# Patient Record
Sex: Female | Born: 1950
Health system: Southern US, Community
[De-identification: ages and names within clinical notes are randomized; demographics above are authoritative.]

## PROBLEM LIST (undated history)

## (undated) DIAGNOSIS — F329 Major depressive disorder, single episode, unspecified: Secondary | ICD-10-CM

## (undated) DIAGNOSIS — F32A Depression, unspecified: Secondary | ICD-10-CM

## (undated) DIAGNOSIS — S82009A Unspecified fracture of unspecified patella, initial encounter for closed fracture: Secondary | ICD-10-CM

## (undated) DIAGNOSIS — I10 Essential (primary) hypertension: Secondary | ICD-10-CM

## (undated) DIAGNOSIS — K5790 Diverticulosis of intestine, part unspecified, without perforation or abscess without bleeding: Secondary | ICD-10-CM

## (undated) DIAGNOSIS — C801 Malignant (primary) neoplasm, unspecified: Secondary | ICD-10-CM

## (undated) HISTORY — DX: Depression, unspecified: F32.A

## (undated) HISTORY — DX: Unspecified fracture of unspecified patella, initial encounter for closed fracture: S82.009A

## (undated) HISTORY — DX: Essential (primary) hypertension: I10

## (undated) HISTORY — PX: REFRACTIVE SURGERY: SHX103

## (undated) HISTORY — PX: TUBAL LIGATION: SHX77

## (undated) HISTORY — DX: Diverticulosis of intestine, part unspecified, without perforation or abscess without bleeding: K57.90

## (undated) HISTORY — DX: Major depressive disorder, single episode, unspecified: F32.9

---

## 1998-02-26 ENCOUNTER — Other Ambulatory Visit: Admission: RE | Admit: 1998-02-26 | Discharge: 1998-02-26 | Payer: Self-pay | Admitting: Obstetrics and Gynecology

## 2005-01-29 ENCOUNTER — Ambulatory Visit: Payer: Self-pay | Admitting: Gastroenterology

## 2005-02-18 ENCOUNTER — Ambulatory Visit: Payer: Self-pay | Admitting: Gastroenterology

## 2005-03-17 ENCOUNTER — Ambulatory Visit: Payer: Self-pay | Admitting: Gastroenterology

## 2005-08-22 ENCOUNTER — Ambulatory Visit (HOSPITAL_COMMUNITY): Admission: RE | Admit: 2005-08-22 | Discharge: 2005-08-22 | Payer: Self-pay | Admitting: Neurology

## 2010-01-16 ENCOUNTER — Encounter (INDEPENDENT_AMBULATORY_CARE_PROVIDER_SITE_OTHER): Payer: Self-pay | Admitting: *Deleted

## 2010-09-30 NOTE — Letter (Signed)
Summary: Colonoscopy Letter  [facility]  [address] [place], [place] [address]   Phone: [phone]  Fax: [phone]      Jan 16, 2010 MRN: [id number]   [facility] [address] [place], [place]  [address]   Dear Ms. Councilman,   According to your medical record, it is time for you to schedule a Colonoscopy. The American Cancer Society recommends this procedure as a method to detect early colon cancer. Patients with a family history of colon cancer, or a personal history of colon polyps or inflammatory bowel disease are at increased risk.  This letter has beeen generated based on the recommendations made at the time of your procedure. If you feel that in your particular situation this may no longer apply, please contact our office.  Please call our office at [phone] to schedule this appointment or to update your records at your earliest convenience.  Thank you for cooperating with [place] to provide you with the very best care possible.   Sincerely,    Vania Rea. Jarold Motto, M.D.  [facility] [phone]

## 2010-11-28 ENCOUNTER — Telehealth: Payer: Self-pay | Admitting: Gastroenterology

## 2010-12-01 NOTE — Telephone Encounter (Signed)
Unable to reach pt. Last COLON 02/18/2005 showed diverticulosis. Recall letter sent 01/16/2010. Ok to switch to Dr Juanda Chance?

## 2010-12-01 NOTE — Telephone Encounter (Signed)
Pt with + hemocult stools from 11/28/10 per Melchor Amour at Dr [facility] office. I offered pt an appt with Willette Cluster, NP for tomorrow, but pt is out of town. Per Melchor Amour, pt has no other s&s. Wilma Flavin an appt for 12/19/10 @ 4pm with Dr Juanda Chance.

## 2010-12-01 NOTE — Telephone Encounter (Signed)
OK 

## 2010-12-01 NOTE — Telephone Encounter (Signed)
Dr Juanda Chance, will you accept the pt?   Thanks.

## 2010-12-01 NOTE — Telephone Encounter (Signed)
HARD TO BELIEVE---OK

## 2010-12-02 ENCOUNTER — Ambulatory Visit: Payer: Self-pay | Admitting: Nurse Practitioner

## 2010-12-19 ENCOUNTER — Encounter: Payer: Self-pay | Admitting: Internal Medicine

## 2010-12-19 ENCOUNTER — Ambulatory Visit (INDEPENDENT_AMBULATORY_CARE_PROVIDER_SITE_OTHER): Payer: BC Managed Care – PPO | Admitting: Internal Medicine

## 2010-12-19 VITALS — BP 120/74 | HR 76 | Ht 62.0 in | Wt 138.0 lb

## 2010-12-19 DIAGNOSIS — K625 Hemorrhage of anus and rectum: Secondary | ICD-10-CM

## 2010-12-19 DIAGNOSIS — R1031 Right lower quadrant pain: Secondary | ICD-10-CM

## 2010-12-19 DIAGNOSIS — R195 Other fecal abnormalities: Secondary | ICD-10-CM

## 2010-12-19 MED ORDER — PEG-KCL-NACL-NASULF-NA ASC-C 100 G PO SOLR: 1.0000 | Freq: Once | ORAL | Status: AC

## 2010-12-19 NOTE — Patient Instructions (Addendum)
You have been scheduled for a colonoscopy. Please follow written instructions given to you at your visit today.  Please pick up your Moviprep kit at the pharmacy within the next 2-3 days. cc Dr Vernon Prey

## 2010-12-19 NOTE — Progress Notes (Signed)
Wendy Palmer Jan 21, 1951 MRN [id number]     History of Present Illness:  This is a 60 year old white female who is here to discuss a recall colonoscopy. She had 2 prior colonoscopies in 1999 and 2006 because of her family history of colon cancer in her parent. She never had any polyps. She works full time as a Midwife and has complained of decreased level of energy. She has intermittent low-grade right lower quadrant abdominal discomfort which she relates to  her ovaries. Her bowel habits have been regular. She had abnormal Hemoccult cards at Dr [facility] office but she reports having gastroenteritis at that time of the testing.   Past Medical History  Diagnosis Date  . Diverticulosis   . Depression   . Patella fracture    Past Surgical History  Procedure Date  . Tubal ligation   . Refractive surgery     reports that she quit smoking about 35 years ago. Her smoking use included Cigarettes. She has never used smokeless tobacco. She reports that she drinks about .5 ounces of alcohol per week. She reports that she does not use illicit drugs. family history includes Alzheimer's disease in her mother; Colon cancer in her mother; and Heart failure in her father. No Known Allergies      Review of Systems:  The remainder of the 10  point ROS is negative except as outlined in H&P   Physical Exam: General appearance  Well developed, in no distress. Eyes- non icteric. HEENT nontraumatic, normocephalic. Mouth no lesions, tongue papillated, no cheilosis. Neck supple without adenopathy, thyroid not enlarged, no carotid bruits, no JVD. Lungs Clear to auscultation bilaterally. Cor normal S1 normal S2, regular rhythm , no murmur,  quiet precordium. Abdomen soft abdomen with normoactive bowel sounds. Mild tenderness on deep palpation in right lower quadrant. Straight leg raising negative. No CVA tenderness, no palpable mass. Extremities no pedal edema. Skin no lesions. Neurological  alert and oriented x 3. Psychological normal mood and affect.  Assessment and Plan:  Hemoccult positive stool in a patient with a family history of colon cancer who is due for a recall colonoscopy. We will proceed with the recall colonoscopy. I have discussed the prep, sedation and the procedure itself. The lower abdominal pain is quite vague and I'm not sure whether it is related to an irritable bowel syndrome or possibly the right ovary. If her colonoscopy is negative, I would consider a pelvic ultrasound for further evaluation.   12/19/2010 Lina Sar

## 2010-12-22 ENCOUNTER — Encounter: Payer: Self-pay | Admitting: Internal Medicine

## 2010-12-25 ENCOUNTER — Encounter: Payer: Self-pay | Admitting: Internal Medicine

## 2011-01-23 ENCOUNTER — Other Ambulatory Visit: Payer: BC Managed Care – PPO | Admitting: Internal Medicine

## 2012-04-22 ENCOUNTER — Telehealth: Payer: Self-pay | Admitting: Gastroenterology

## 2012-04-22 ENCOUNTER — Encounter: Payer: Self-pay | Admitting: Internal Medicine

## 2012-04-22 NOTE — Telephone Encounter (Signed)
Already Done 10-2010

## 2012-05-19 ENCOUNTER — Ambulatory Visit (AMBULATORY_SURGERY_CENTER): Payer: BC Managed Care – PPO | Admitting: *Deleted

## 2012-05-19 VITALS — Ht 62.5 in | Wt 136.6 lb

## 2012-05-19 DIAGNOSIS — Z1211 Encounter for screening for malignant neoplasm of colon: Secondary | ICD-10-CM

## 2012-05-19 MED ORDER — MOVIPREP 100 G PO SOLR: ORAL | Status: DC

## 2012-05-20 ENCOUNTER — Encounter: Payer: Self-pay | Admitting: Internal Medicine

## 2012-05-30 ENCOUNTER — Telehealth: Payer: Self-pay | Admitting: Internal Medicine

## 2012-05-30 NOTE — Telephone Encounter (Signed)
Spoke with patient and told her she is already overdue for her colonoscopy, she has a family history of colon cancer and she had + blood in stool a year ago. Could not recommend she delay having the procedure until she is 70. She understands and will keep the appointment.

## 2012-05-30 NOTE — Telephone Encounter (Signed)
Pt would like to speak with someone regarding possibly moving her Colonoscopy to a later date.

## 2012-06-02 ENCOUNTER — Ambulatory Visit (AMBULATORY_SURGERY_CENTER): Payer: BC Managed Care – PPO | Admitting: Internal Medicine

## 2012-06-02 ENCOUNTER — Encounter: Payer: Self-pay | Admitting: Internal Medicine

## 2012-06-02 VITALS — BP 150/70 | HR 87 | Temp 96.2°F | Resp 20 | Ht 62.5 in | Wt 136.0 lb

## 2012-06-02 DIAGNOSIS — Z1211 Encounter for screening for malignant neoplasm of colon: Secondary | ICD-10-CM

## 2012-06-02 DIAGNOSIS — Z800 Family history of malignant neoplasm of digestive organs: Secondary | ICD-10-CM

## 2012-06-02 DIAGNOSIS — R195 Other fecal abnormalities: Secondary | ICD-10-CM

## 2012-06-02 MED ORDER — SODIUM CHLORIDE 0.9 % IV SOLN: 500.0000 mL | INTRAVENOUS | Status: DC

## 2012-06-02 NOTE — Op Note (Signed)
[facility] [address] [place], [address]   COLONOSCOPY PROCEDURE REPORT  PATIENT: Wendy Palmer, Wendy Palmer  MR#: [id number] BIRTHDATE: 01-18-1951 , 61  yrs. old GENDER: Female ENDOSCOPIST: Hart Carwin, MD REFERRED BY:  Rudi Heap, M.D. PROCEDURE DATE:  06/02/2012 PROCEDURE:   Colonoscopy, screening ASA CLASS:   Class I INDICATIONS:patient's immediate family history of colon cancer ,parent with colon cancer, prior colonoscopies [id number]. MEDICATIONS: MAC sedation, administered by CRNA and Propofol (Diprivan) 150 mg IV  DESCRIPTION OF PROCEDURE:   After the risks and benefits and of the procedure were explained, informed consent was obtained.  A digital rectal exam revealed no abnormalities of the rectum.    The LB [id number]-[id number]  endoscope was introduced through the anus and advanced to the cecum, which was identified by both the appendix and ileocecal valve .  The quality of the prep was good, using MoviPrep .  The instrument was then slowly withdrawn as the colon was fully examined.     COLON FINDINGS: A normal appearing cecum, ileocecal valve, and appendiceal orifice were identified.  The ascending, hepatic flexure, transverse, splenic flexure, descending, sigmoid colon and rectum appeared unremarkable.  No polyps or cancers were seen. Retroflexed views revealed no abnormalities.     The scope was then withdrawn from the patient and the procedure completed.  COMPLICATIONS: There were no complications. ENDOSCOPIC IMPRESSION: Normal colon , nothing to account for heme positive stool test  RECOMMENDATIONS: High fiber diet   REPEAT EXAM: In 5 year(s)  for Colonoscopy.  cc:  _______________________________ eSignedHart Carwin, MD 06/02/2012 9:32 AM

## 2012-06-02 NOTE — Progress Notes (Signed)
Propofol per m smith crna, all meds titrated per crna during procedure, see scanned intra procedure report. emw

## 2012-06-02 NOTE — Patient Instructions (Addendum)

## 2012-06-02 NOTE — Progress Notes (Signed)
Patient did not experience any of the following events: a burn prior to discharge; a fall within the facility; wrong site/side/patient/procedure/implant event; or a hospital transfer or hospital admission upon discharge from the facility. (G8907) Patient did not have preoperative order for IV antibiotic SSI prophylaxis. (G8918)  

## 2012-06-03 ENCOUNTER — Telehealth: Payer: Self-pay | Admitting: *Deleted

## 2012-06-03 NOTE — Telephone Encounter (Signed)
No answer, left message to call if questions or concerns. 

## 2012-11-15 ENCOUNTER — Other Ambulatory Visit: Payer: Self-pay | Admitting: Family Medicine

## 2012-11-18 ENCOUNTER — Other Ambulatory Visit: Payer: Self-pay | Admitting: Family Medicine

## 2012-12-21 ENCOUNTER — Ambulatory Visit (INDEPENDENT_AMBULATORY_CARE_PROVIDER_SITE_OTHER): Payer: BC Managed Care – PPO

## 2012-12-21 ENCOUNTER — Encounter: Payer: Self-pay | Admitting: Family Medicine

## 2012-12-21 ENCOUNTER — Ambulatory Visit (INDEPENDENT_AMBULATORY_CARE_PROVIDER_SITE_OTHER): Payer: BC Managed Care – PPO | Admitting: Family Medicine

## 2012-12-21 VITALS — BP 127/59 | HR 69 | Temp 97.1°F | Ht 61.5 in | Wt 138.0 lb

## 2012-12-21 DIAGNOSIS — F329 Major depressive disorder, single episode, unspecified: Secondary | ICD-10-CM

## 2012-12-21 DIAGNOSIS — F411 Generalized anxiety disorder: Secondary | ICD-10-CM

## 2012-12-21 DIAGNOSIS — M25561 Pain in right knee: Secondary | ICD-10-CM

## 2012-12-21 DIAGNOSIS — R0609 Other forms of dyspnea: Secondary | ICD-10-CM

## 2012-12-21 DIAGNOSIS — R5383 Other fatigue: Secondary | ICD-10-CM

## 2012-12-21 DIAGNOSIS — F3289 Other specified depressive episodes: Secondary | ICD-10-CM

## 2012-12-21 DIAGNOSIS — I10 Essential (primary) hypertension: Secondary | ICD-10-CM | POA: Insufficient documentation

## 2012-12-21 DIAGNOSIS — M25569 Pain in unspecified knee: Secondary | ICD-10-CM

## 2012-12-21 DIAGNOSIS — E785 Hyperlipidemia, unspecified: Secondary | ICD-10-CM

## 2012-12-21 DIAGNOSIS — Z23 Encounter for immunization: Secondary | ICD-10-CM

## 2012-12-21 DIAGNOSIS — R5381 Other malaise: Secondary | ICD-10-CM

## 2012-12-21 DIAGNOSIS — L719 Rosacea, unspecified: Secondary | ICD-10-CM

## 2012-12-21 DIAGNOSIS — M51379 Other intervertebral disc degeneration, lumbosacral region without mention of lumbar back pain or lower extremity pain: Secondary | ICD-10-CM

## 2012-12-21 DIAGNOSIS — R0989 Other specified symptoms and signs involving the circulatory and respiratory systems: Secondary | ICD-10-CM

## 2012-12-21 DIAGNOSIS — Z2911 Encounter for prophylactic immunotherapy for respiratory syncytial virus (RSV): Secondary | ICD-10-CM

## 2012-12-21 DIAGNOSIS — F419 Anxiety disorder, unspecified: Secondary | ICD-10-CM

## 2012-12-21 DIAGNOSIS — M5136 Other intervertebral disc degeneration, lumbar region: Secondary | ICD-10-CM

## 2012-12-21 DIAGNOSIS — M5137 Other intervertebral disc degeneration, lumbosacral region: Secondary | ICD-10-CM

## 2012-12-21 NOTE — Progress Notes (Signed)
Subjective:    Patient ID: Wendy Palmer, female    DOB: August 28, 1951, 62 y.o.   MRN: [id number]  HPI This patient presents for recheck of multiple medical problems. No one accompanies the patient today.  Patient Active Problem List  Diagnosis  . Rosacea  . Hypertension  . Hyperlipemia    In addition, See ROS.  The allergies, current medications, past medical history, surgical history, family and social history are reviewed.  Immunizations reviewed.  Health maintenance reviewed.  The following items are outstanding: Zostavax      Review of Systems  Constitutional: Positive for fatigue.  HENT: Negative.   Eyes: Negative.   Respiratory: Positive for shortness of breath (on exertion).   Cardiovascular: Negative.   Gastrointestinal: Negative.   Genitourinary: Negative.   Musculoskeletal: Positive for back pain and arthralgias (R knee).  Allergic/Immunologic: Negative.   Neurological: Positive for numbness (bilateral hands occasionally with skin discoloration).  Psychiatric/Behavioral: Negative.        Objective:   Physical Exam BP 127/59  Pulse 69  Temp(Src) 97.1 F (36.2 C) (Oral)  Ht 5' 1.5" (1.562 m)  Wt 138 lb (62.596 kg)  BMI 25.66 kg/m2  The patient appeared well nourished and normally developed, alert and oriented to time and place. Speech, behavior and judgement appear normal. Vital signs as documented.  Head exam is unremarkable with the exception of mild rhinitis which is worse on right than on left.  No scleral icterus or pallor noted.  Neck is without jugular venous distension, thyromegally, or carotid bruits. Carotid upstrokes are brisk bilaterally. No cervical adenopathy. Lungs are clear anteriorly and posteriorly to auscultation. Normal respiratory effort. Cardiac exam reveals regular rate of 72 bpm and regular rhythm. First and second heart sounds normal. No murmurs, rubs or gallops.  Abdominal exam reveals normal bowl sou nds, no masses, no  organomegaly and no aortic enlargement. No inguinal adenopathy. Breasts are normal bilaterally with no lymph node enlargement or nodules. Extremities are nonedematous and both femoral and pedal pulses are normal. Skin without pallor or jaundice.  Warm and dry, without rash. Neurologic exam reveals normal deep tendon reflexes and normal sensation. Knee pain elicited when stiff leg raised.  Unable to elicit with any other movements.      [facility] reading (PRIMARY) by  Dr. Christell Constant:  Chest x-ray:  Chest x-ray within normal limits                                                                                                 Right knee :  Calcium buildup on the patella        Assessment & Plan:  1. Rosacea - POCT urinalysis dipstick - POCT UA - Microscopic Only -Continue treatment as prescribed by Dr.Tafeen  2. Hypertension - BASIC METABOLIC PANEL WITH GFR Continue atenolol 50 daily  3. Hyperlipemia - Hepatic function panel - NMR Lipoprofile with Lipids  4. Knee pain, right - DG Knee 1-2 Views Right; Future  5. Degenerative disc disease, lumbar  6. Depression Continue imipramine daily  7. Anxiety Continue imipramine daily  8. Dyspnea on exertion - CBC  with Differential - DG Chest 2 View; Future  9. Fatigue - CBC with Differential - Vitamin D 25 hydroxy - Thyroid Panel With TSH  10. We'll give Zostavax today to catch up on immunizations  Patient Instructions  Fall precautions discussed Continue current meds and therapuetic lifestyle changes  If knee pain continues call our office for an orthopedic referral to Dr. Lequita Halt.

## 2012-12-21 NOTE — Patient Instructions (Addendum)
Fall precautions discussed Continue current meds and therapuetic lifestyle changes  If knee pain continues call our office for an orthopedic referral to Dr. Lequita Halt.  Herpes Zoster Virus Vaccine What is this medicine? HERPES ZOSTER VIRUS VACCINE (HUR peez ZOS ter vahy ruhs vak SEEN) is a vaccine. It is used to prevent shingles in adults 64 years old and over. This vaccine is not used to treat shingles or nerve pain from shingles. This medicine may be used for other purposes; ask your health care provider or pharmacist if you have questions. What should I tell my health care provider before I take this medicine? They need to know if you have any of these conditions: -cancer like leukemia or lymphoma -immune system problems or therapy -infection with fever -tuberculosis -an unusual or allergic reaction to vaccines, neomycin, gelatin, other medicines, foods, dyes, or preservatives -pregnant or trying to get pregnant -breast-feeding How should I use this medicine? This vaccine is for injection under the skin. It is given by a health care professional. Talk to your pediatrician regarding the use of this medicine in children. This medicine is not approved for use in children. Overdosage: If you think you have taken too much of this medicine contact a poison control center or emergency room at once. NOTE: This medicine is only for you. Do not share this medicine with others. What if I miss a dose? This does not apply. What may interact with this medicine? Do not take this medicine with any of the following medications: -adalimumab -anakinra -etanercept -infliximab -medicines to treat cancer -medicines that suppress your immune system This medicine may also interact with the following medications: -immunoglobulins -steroid medicines like prednisone or cortisone This list may not describe all possible interactions. Give your health care provider a list of all the medicines, herbs,  non-prescription drugs, or dietary supplements you use. Also tell them if you smoke, drink alcohol, or use illegal drugs. Some items may interact with your medicine. What should I watch for while using this medicine? Visit your doctor for regular check ups. This vaccine, like all vaccines, may not fully protect everyone. After receiving this vaccine it may be possible to pass chickenpox infection to others. Avoid people with immune system problems, pregnant women who have not had chickenpox, and newborns of women who have not had chickenpox. Talk to your doctor for more information. What side effects may I notice from receiving this medicine? Side effects that you should report to your doctor or health care professional as soon as possible: -allergic reactions like skin rash, itching or hives, swelling of the face, lips, or tongue -breathing problems -feeling faint or lightheaded, falls -fever, flu-like symptoms -pain, tingling, numbness in the hands or feet -swelling of the ankles, feet, hands -unusually weak or tired Side effects that usually do not require medical attention (report to your doctor or health care professional if they continue or are bothersome): -aches or pains -chickenpox-like rash -diarrhea -headache -loss of appetite -nausea, vomiting -redness, pain, swelling at site where injected -runny nose This list may not describe all possible side effects. Call your doctor for medical advice about side effects. You may report side effects to FDA at 1-800-FDA-1088. Where should I keep my medicine? This drug is given in a hospital or clinic and will not be stored at home. NOTE: This sheet is a summary. It may not cover all possible information. If you have questions about this medicine, talk to your doctor, pharmacist, or health care provider.  2013,  [employer]vier/Gold Standard. (02/03/2010 5:43:50 PM)

## 2012-12-22 ENCOUNTER — Other Ambulatory Visit (INDEPENDENT_AMBULATORY_CARE_PROVIDER_SITE_OTHER): Payer: BC Managed Care – PPO

## 2012-12-22 ENCOUNTER — Telehealth: Payer: Self-pay | Admitting: *Deleted

## 2012-12-22 DIAGNOSIS — R5381 Other malaise: Secondary | ICD-10-CM

## 2012-12-22 DIAGNOSIS — M25469 Effusion, unspecified knee: Secondary | ICD-10-CM

## 2012-12-22 DIAGNOSIS — R5383 Other fatigue: Secondary | ICD-10-CM

## 2012-12-22 LAB — POCT CBC
HCT, POC: 42.2 % (ref 37.7–47.9)
MCHC: 34.2 g/dL (ref 31.8–35.4)
MPV: 8.6 fL (ref 0–99.8)
POC Granulocyte: 5.5 (ref 2–6.9)
POC LYMPH PERCENT: 26.5 %L (ref 10–50)
RDW, POC: 13 %
WBC: 7.7 10*3/uL (ref 4.6–10.2)

## 2012-12-22 LAB — THYROID PANEL WITH TSH: TSH: 1.539 u[IU]/mL (ref 0.350–4.500)

## 2012-12-22 LAB — HEPATIC FUNCTION PANEL
ALT: 15 U/L (ref 0–35)
Alkaline Phosphatase: 59 U/L (ref 39–117)
Indirect Bilirubin: 0.2 mg/dL (ref 0.0–0.9)
Total Protein: 5.9 g/dL — ABNORMAL LOW (ref 6.0–8.3)

## 2012-12-22 NOTE — Telephone Encounter (Signed)
Message copied by Bearl Mulberry on Thu Dec 22, 2012  2:38 PM ------      Message from: Ernestina Penna      Created: Wed Dec 21, 2012  9:15 PM       Small knee effusion, set up appointment for her to see Dr. Antony Odea when he is in [place] the next time ------

## 2012-12-22 NOTE — Addendum Note (Signed)
Addended by: Roselyn Reef on: 12/22/2012 08:19 AM   Modules accepted: Orders

## 2012-12-22 NOTE — Telephone Encounter (Signed)
Pt notified of results Will set up appt with Dr Berton Lan

## 2012-12-23 ENCOUNTER — Other Ambulatory Visit: Payer: Self-pay | Admitting: Family Medicine

## 2012-12-23 LAB — NMR LIPOPROFILE WITH LIPIDS
HDL Particle Number: 42.4 umol/L (ref >=30.5)
LDL Size: 20.5 nm — ABNORMAL LOW (ref >20.5)
Large HDL-P: 6.1 umol/L (ref >=4.8)
Small LDL Particle Number: 687 nmol/L — ABNORMAL HIGH (ref <=527)

## 2012-12-23 LAB — VITAMIN D 25 HYDROXY (VIT D DEFICIENCY, FRACTURES): Vit D, 25-Hydroxy: 34 ng/mL (ref 30–89)

## 2012-12-23 LAB — BASIC METABOLIC PANEL WITH GFR
CO2: 25 mEq/L (ref 19–32)
Chloride: 106 mEq/L (ref 96–112)
Sodium: 138 mEq/L (ref 135–145)

## 2012-12-23 NOTE — Telephone Encounter (Signed)
Sorry Carlon did do referral   Disregard

## 2012-12-23 NOTE — Telephone Encounter (Signed)
Referral not in my workqueue

## 2012-12-26 ENCOUNTER — Telehealth: Payer: Self-pay | Admitting: *Deleted

## 2012-12-26 LAB — POCT URINALYSIS DIPSTICK
Bilirubin, UA: NEGATIVE
Blood, UA: NEGATIVE
Nitrite, UA: NEGATIVE
pH, UA: 5

## 2012-12-26 LAB — POCT UA - MICROSCOPIC ONLY
Crystals, Ur, HPF, POC: NEGATIVE
Yeast, UA: NEGATIVE

## 2012-12-26 NOTE — Telephone Encounter (Signed)
Pt notified of results Will call back to schedule appt with clinical pharmacist

## 2012-12-26 NOTE — Telephone Encounter (Signed)
Message copied by Bearl Mulberry on Mon Dec 26, 2012  6:20 PM ------      Message from: Ernestina Penna      Created: Fri Dec 23, 2012  8:07 PM       Liver Function test within normal limits      On advanced lipid panel the LDL particle number is very elevated at 1545, triglycerides are good, small LDL is elevated--- appointment with clinical                        pharmacist to discuss diet and treatment      Thyroid within normal limit      Blood sugar and renal are good, potassium is slightly elevated, when patient has visit with clinical pharmacist repeat BMP to check potassium      Vitamin D is 34, increase D3 byt 1000 daily                   ------

## 2012-12-27 NOTE — Progress Notes (Signed)
Where his CBC?

## 2012-12-29 ENCOUNTER — Telehealth: Payer: Self-pay | Admitting: Family Medicine

## 2012-12-29 NOTE — Telephone Encounter (Signed)
Pt notified that it is possible to have redness and itching from the vaccine She stated that it is better now

## 2013-01-12 ENCOUNTER — Telehealth: Payer: Self-pay | Admitting: Family Medicine

## 2013-01-12 NOTE — Telephone Encounter (Signed)
appt made for Tuesday, June 6th at 1pm. Please call and notify patient as I am out of the office until Monday, May 19th

## 2013-02-14 ENCOUNTER — Other Ambulatory Visit: Payer: Self-pay | Admitting: Nurse Practitioner

## 2013-02-17 ENCOUNTER — Other Ambulatory Visit: Payer: Self-pay | Admitting: Family Medicine

## 2013-03-08 ENCOUNTER — Encounter: Payer: Self-pay | Admitting: Pharmacist

## 2013-03-08 ENCOUNTER — Ambulatory Visit (INDEPENDENT_AMBULATORY_CARE_PROVIDER_SITE_OTHER): Payer: BC Managed Care – PPO | Admitting: Pharmacist

## 2013-03-08 VITALS — BP 122/72 | HR 70 | Ht 61.0 in | Wt 140.0 lb

## 2013-03-08 DIAGNOSIS — E875 Hyperkalemia: Secondary | ICD-10-CM

## 2013-03-08 DIAGNOSIS — R5383 Other fatigue: Secondary | ICD-10-CM

## 2013-03-08 DIAGNOSIS — E785 Hyperlipidemia, unspecified: Secondary | ICD-10-CM

## 2013-03-08 LAB — VITAMIN B12: Vitamin B-12: 599 pg/mL (ref 211–911)

## 2013-03-08 NOTE — Progress Notes (Signed)
Lipid Clinic Consultation  Chief Complaint:   Chief Complaint  Patient presents with  . Hyperlipidemia     Exam Edema:  Negative Respirations:  nromal   Xanthomas:  negative General Appearance:  alert, oriented, no acute distress and well nourished Mood/Affect:  normal  HPI:  Has history of elevated LDL-P.  Has seen a recent slight improvement but not following a specific diet.  No current medication for hyperlipidemia.  No family history of CAD     Component Value Date/Time   TRIG 74 12/22/2012 0820  LDL-P = 1545 LDL-c = 109 Assessment: CHD/CHF Risk Equivalents:  none NCEP Risk Factors Present:  HTN and age Primary Problem(s):  LDL or LDL-P elevated  AHA risk assessment = 5.9% 10 year ASCVD risk  Current NCEP Goals: LDL Goal < 100 HDL Goal >/= 45 Tg Goal < 161 Non-HDL Goal < 130  Secondary cause of hyperlipidemia present:  none Low fat diet followed?  Yes - most of the time but admits that diet is low in fruits and vegetables Low carb diet followed?  Yes -   Exercise?  Yes - but has decreased recently - not walking as much  Recommendations: Changes in lipid medication(s):  None Diet discussed in depth - Mediterranean type diet Recommended exercise - both aerobic and strength building - pt to see about [facility]. Recheck Lipid Panel:  3 months Other labs needed:  Checking BMP today because potassium was elevated at last check   Time spent counseling patient:  30 minutes

## 2013-03-09 ENCOUNTER — Telehealth: Payer: Self-pay | Admitting: Pharmacist

## 2013-03-09 DIAGNOSIS — E785 Hyperlipidemia, unspecified: Secondary | ICD-10-CM

## 2013-03-09 LAB — BASIC METABOLIC PANEL WITH GFR
CO2: 28 mEq/L (ref 19–32)
Calcium: 9.3 mg/dL (ref 8.4–10.5)
GFR, Est African American: >89 mL/min
GFR, Est Non African American: >89 mL/min
Sodium: 136 mEq/L (ref 135–145)

## 2013-03-09 NOTE — Telephone Encounter (Signed)
All labs from yesterday WNL. Patient notified

## 2013-03-21 ENCOUNTER — Other Ambulatory Visit: Payer: Self-pay | Admitting: Family Medicine

## 2013-03-21 ENCOUNTER — Telehealth: Payer: Self-pay | Admitting: Family Medicine

## 2013-04-04 ENCOUNTER — Other Ambulatory Visit: Payer: Self-pay | Admitting: Family Medicine

## 2013-04-18 ENCOUNTER — Other Ambulatory Visit: Payer: Self-pay | Admitting: Family Medicine

## 2013-04-19 NOTE — Telephone Encounter (Signed)
Last seen 12/21/12  DWM 

## 2013-05-02 NOTE — Telephone Encounter (Signed)
RX called into [facility] on 03-21-2013. Patient aware.

## 2013-06-09 ENCOUNTER — Other Ambulatory Visit: Payer: Self-pay

## 2013-08-05 ENCOUNTER — Telehealth: Payer: Self-pay | Admitting: Nurse Practitioner

## 2013-08-07 ENCOUNTER — Telehealth: Payer: Self-pay | Admitting: Family Medicine

## 2013-08-07 DIAGNOSIS — N390 Urinary tract infection, site not specified: Secondary | ICD-10-CM

## 2013-08-07 NOTE — Telephone Encounter (Signed)
Pt aware to come leave a urine and we will check it

## 2013-08-08 ENCOUNTER — Other Ambulatory Visit: Payer: BC Managed Care – PPO | Admitting: Nurse Practitioner

## 2013-08-11 NOTE — Telephone Encounter (Signed)
appt 1/6 at 2:00 with [facility]

## 2013-08-16 ENCOUNTER — Other Ambulatory Visit: Payer: BC Managed Care – PPO | Admitting: Nurse Practitioner

## 2013-09-05 ENCOUNTER — Other Ambulatory Visit: Payer: BC Managed Care – PPO | Admitting: Nurse Practitioner

## 2013-09-26 ENCOUNTER — Other Ambulatory Visit: Payer: Self-pay | Admitting: Family Medicine

## 2013-09-27 ENCOUNTER — Telehealth: Payer: Self-pay | Admitting: Pharmacist

## 2013-09-27 NOTE — Telephone Encounter (Signed)
Patient called and discussed her concerns about Medicare Part D plans.

## 2013-09-28 ENCOUNTER — Telehealth: Payer: Self-pay | Admitting: Family Medicine

## 2013-09-28 NOTE — Telephone Encounter (Signed)
Patient last seen in office on 03-08-13 by Tammy and on 4-23 by you . Please advise.

## 2013-09-29 MED ORDER — IMIPRAMINE HCL 50 MG PO TABS: ORAL_TABLET | ORAL | Status: DC

## 2013-09-29 MED ORDER — ATENOLOL 25 MG PO TABS: 25.0000 mg | ORAL_TABLET | Freq: Every day | ORAL | Status: DC

## 2013-09-29 MED ORDER — ESTRADIOL-NORETHINDRONE ACET 1-0.5 MG PO TABS: 1.0000 | ORAL_TABLET | Freq: Every day | ORAL | Status: DC

## 2013-09-29 NOTE — Telephone Encounter (Signed)
done

## 2013-10-03 ENCOUNTER — Ambulatory Visit: Payer: BC Managed Care – PPO

## 2013-11-14 ENCOUNTER — Encounter: Payer: Self-pay | Admitting: Nurse Practitioner

## 2013-11-14 ENCOUNTER — Ambulatory Visit (INDEPENDENT_AMBULATORY_CARE_PROVIDER_SITE_OTHER): Payer: Medicare Other | Admitting: Nurse Practitioner

## 2013-11-14 VITALS — BP 122/67 | HR 83 | Temp 98.0°F | Ht 61.0 in | Wt 143.0 lb

## 2013-11-14 DIAGNOSIS — L719 Rosacea, unspecified: Secondary | ICD-10-CM

## 2013-11-14 DIAGNOSIS — Z124 Encounter for screening for malignant neoplasm of cervix: Secondary | ICD-10-CM

## 2013-11-14 DIAGNOSIS — N390 Urinary tract infection, site not specified: Secondary | ICD-10-CM

## 2013-11-14 DIAGNOSIS — Z01419 Encounter for gynecological examination (general) (routine) without abnormal findings: Secondary | ICD-10-CM

## 2013-11-14 DIAGNOSIS — Z0000 Encounter for general adult medical examination without abnormal findings: Secondary | ICD-10-CM

## 2013-11-14 DIAGNOSIS — I10 Essential (primary) hypertension: Secondary | ICD-10-CM

## 2013-11-14 DIAGNOSIS — E785 Hyperlipidemia, unspecified: Secondary | ICD-10-CM

## 2013-11-14 LAB — POCT URINALYSIS DIPSTICK
BILIRUBIN UA: NEGATIVE
GLUCOSE UA: NEGATIVE
Ketones, UA: NEGATIVE
Nitrite, UA: NEGATIVE
PH UA: 6
Protein, UA: NEGATIVE
Spec Grav, UA: 1.01
Urobilinogen, UA: NEGATIVE

## 2013-11-14 LAB — POCT CBC
Granulocyte percent: 58.3 %G (ref 37–80)
HCT, POC: 38.5 % (ref 37.7–47.9)
Hemoglobin: 12.4 g/dL (ref 12.2–16.2)
Lymph, poc: 3 (ref 0.6–3.4)
MCH, POC: 28.3 pg (ref 27–31.2)
MCHC: 32.1 g/dL (ref 31.8–35.4)
MCV: 88.2 fL (ref 80–97)
MPV: 8.7 fL (ref 0–99.8)
PLATELET COUNT, POC: 218 10*3/uL (ref 142–424)
POC GRANULOCYTE: 4.4 (ref 2–6.9)
POC LYMPH %: 40.4 % (ref 10–50)
RBC: 4.4 M/uL (ref 4.04–5.48)
RDW, POC: 15.1 %
WBC: 7.5 10*3/uL (ref 4.6–10.2)

## 2013-11-14 LAB — POCT UA - MICROSCOPIC ONLY
Casts, Ur, LPF, POC: NEGATIVE
Crystals, Ur, HPF, POC: NEGATIVE
Mucus, UA: NEGATIVE
Yeast, UA: NEGATIVE

## 2013-11-14 MED ORDER — ESTRADIOL-NORETHINDRONE ACET 1-0.5 MG PO TABS: 1.0000 | ORAL_TABLET | Freq: Every day | ORAL | Status: DC

## 2013-11-14 MED ORDER — FLUCONAZOLE 150 MG PO TABS: 150.0000 mg | ORAL_TABLET | Freq: Once | ORAL | Status: DC

## 2013-11-14 MED ORDER — ATENOLOL 25 MG PO TABS: 25.0000 mg | ORAL_TABLET | Freq: Every day | ORAL | Status: DC

## 2013-11-14 MED ORDER — CIPROFLOXACIN HCL 500 MG PO TABS: 500.0000 mg | ORAL_TABLET | Freq: Two times a day (BID) | ORAL | Status: DC

## 2013-11-14 MED ORDER — IMIPRAMINE HCL 50 MG PO TABS: ORAL_TABLET | ORAL | Status: DC

## 2013-11-14 NOTE — Progress Notes (Signed)
Subjective:    Patient ID: Wendy Palmer, female    DOB: 03-30-1951, 63 y.o.   MRN: [id number]  HPI  Patient is regular patient of Dr. Laurance Flatten that is here today for annual physical exam and pap and breast only- SHe was last seen by him  Almost a year ago. Sh esays that she is doing well- No complaints today. Patient Active Problem List   Diagnosis Date Noted  . Rosacea 12/21/2012  . Hypertension 12/21/2012  . Hyperlipemia 12/21/2012   Outpatient Encounter Prescriptions as of 11/14/2013  Medication Sig  . aspirin 81 MG tablet Take 81 mg by mouth daily.    Marland Kitchen atenolol (TENORMIN) 25 MG tablet Take 1 tablet (25 mg total) by mouth daily.  . Biotin 1 MG CAPS Take 1 tablet by mouth daily.  . Cholecalciferol (VITAMIN D) 1000 UNITS capsule Take 1,000 Units by mouth daily.  . Cyanocobalamin (B-12 PO) Take by mouth daily.  Marland Kitchen estradiol-norethindrone (MIMVEY) 1-0.5 MG per tablet Take 1 tablet by mouth daily.  Marland Kitchen FINACEA 15 % cream Apply 1 application topically 2 (two) times daily.  Marland Kitchen imipramine (TOFRANIL) 50 MG tablet TAKE 2 TABLETS BY MOUTH AT BEDTIME  . Multiple Vitamin (MULTIVITAMIN) tablet Take 1 tablet by mouth daily.  . naproxen sodium (ANAPROX) 220 MG tablet Take 220 mg by mouth daily.  . Omega-3 Fatty Acids (FISH OIL PO) Take 1 capsule by mouth daily.    . minocycline (MINOCIN,DYNACIN) 100 MG capsule Take 1 capsule by mouth daily.       Review of Systems  HENT: Negative.   Eyes: Negative.   Respiratory: Negative.   Cardiovascular: Negative.   Gastrointestinal: Negative.   Genitourinary: Negative.   Musculoskeletal: Negative.   Neurological: Negative.   Psychiatric/Behavioral: Negative.   All other systems reviewed and are negative.       Objective:   Physical Exam  Constitutional: She is oriented to person, place, and time. She appears well-developed and well-nourished.  HENT:  Head: Normocephalic.  Right Ear: Hearing, tympanic membrane, external ear and ear canal normal.    Left Ear: Hearing, tympanic membrane, external ear and ear canal normal.  Nose: Nose normal.  Mouth/Throat: Uvula is midline and oropharynx is clear and moist.  Eyes: Conjunctivae and EOM are normal. Pupils are equal, round, and reactive to light.  Neck: Normal range of motion and full passive range of motion without pain. Neck supple. No JVD present. Carotid bruit is not present. No mass and no thyromegaly present.  Cardiovascular: Normal rate, normal heart sounds and intact distal pulses.   No murmur heard. Pulmonary/Chest: Effort normal and breath sounds normal. Right breast exhibits no inverted nipple, no mass, no nipple discharge, no skin change and no tenderness. Left breast exhibits no inverted nipple, no mass, no nipple discharge, no skin change and no tenderness.  Abdominal: Soft. Bowel sounds are normal. She exhibits no mass. There is no tenderness.  Genitourinary: Vagina normal and uterus normal. No breast swelling, tenderness, discharge or bleeding.  bimanual exam-No adnexal masses or tenderness. Cervix stenosised- no discharge  Musculoskeletal: Normal range of motion.  Lymphadenopathy:    She has no cervical adenopathy.  Neurological: She is alert and oriented to person, place, and time.  Skin: Skin is warm and dry.  Psychiatric: She has a normal mood and affect. Her behavior is normal. Judgment and thought content normal.   BP 122/67  Pulse 83  Temp(Src) 98 F (36.7 C) (Oral)  Ht 5' 1"  (1.549 m)  Wt 143 lb (64.864 kg)  BMI 27.03 kg/m2  Results for orders placed in visit on 11/14/13  POCT UA - MICROSCOPIC ONLY      Result Value Ref Range   WBC, Ur, HPF, POC 5-10     RBC, urine, microscopic 1-3     Bacteria, U Microscopic few     Mucus, UA negative     Epithelial cells, urine per micros few     Crystals, Ur, HPF, POC negative     Casts, Ur, LPF, POC negative     Yeast, UA negative    POCT URINALYSIS DIPSTICK      Result Value Ref Range   Color, UA gold      Clarity, UA clear     Glucose, UA negative     Bilirubin, UA negative     Ketones, UA negative     Spec Grav, UA 1.010     Blood, UA trace     pH, UA 6.0     Protein, UA negative     Urobilinogen, UA negative     Nitrite, UA negative     Leukocytes, UA small (1+)           Assessment & Plan:   1. Hypertension   2. Hyperlipemia   3. Rosacea   4. Annual physical exam   5. Encounter for routine gynecological examination    Orders Placed This Encounter  Procedures  . CMP14+EGFR  . NMR, lipoprofile  . Thyroid Panel With TSH  . POCT CBC   Meds ordered this encounter  Medications  . imipramine (TOFRANIL) 50 MG tablet    Sig: TAKE 2 TABLETS BY MOUTH AT BEDTIME    Dispense:  60 tablet    Refill:  11    Order Specific Question:  Supervising Provider    Answer:  Chipper Herb [[id number]]  . atenolol (TENORMIN) 25 MG tablet    Sig: Take 1 tablet (25 mg total) by mouth daily.    Dispense:  30 tablet    Refill:  11    Order Specific Question:  Supervising Provider    Answer:  Chipper Herb [[id number]]  . estradiol-norethindrone (MIMVEY) 1-0.5 MG per tablet    Sig: Take 1 tablet by mouth daily.    Dispense:  28 tablet    Refill:  11    Order Specific Question:  Supervising Provider    Answer:  Chipper Herb [facility] pending Health maintenance reviewed Diet and exercise encouraged Continue all meds Follow up  In 6 month   Victoria, FNP

## 2013-11-14 NOTE — Patient Instructions (Signed)

## 2013-11-16 ENCOUNTER — Telehealth: Payer: Self-pay | Admitting: *Deleted

## 2013-11-16 LAB — CMP14+EGFR
A/G RATIO: 1.9 (ref 1.1–2.5)
ALK PHOS: 65 IU/L (ref 39–117)
ALT: 14 IU/L (ref 0–32)
AST: 19 IU/L (ref 0–40)
Albumin: 3.9 g/dL (ref 3.6–4.8)
BUN / CREAT RATIO: 21 (ref 11–26)
BUN: 15 mg/dL (ref 8–27)
CALCIUM: 9.2 mg/dL (ref 8.7–10.3)
CO2: 23 mmol/L (ref 18–29)
Chloride: 103 mmol/L (ref 97–108)
Creatinine, Ser: 0.7 mg/dL (ref 0.57–1.00)
GFR calc Af Amer: 106 mL/min/{1.73_m2} (ref >59)
GFR, EST NON AFRICAN AMERICAN: 92 mL/min/{1.73_m2} (ref >59)
Globulin, Total: 2.1 g/dL (ref 1.5–4.5)
Glucose: 102 mg/dL — ABNORMAL HIGH (ref 65–99)
POTASSIUM: 4.1 mmol/L (ref 3.5–5.2)
SODIUM: 139 mmol/L (ref 134–144)
Total Bilirubin: <0.2 mg/dL (ref 0.0–1.2)
Total Protein: 6 g/dL (ref 6.0–8.5)

## 2013-11-16 LAB — NMR, LIPOPROFILE
Cholesterol: 184 mg/dL (ref <200)
HDL Cholesterol by NMR: 63 mg/dL (ref >=40)
HDL Particle Number: 43.3 umol/L (ref >=30.5)
LDL Particle Number: 1343 nmol/L — ABNORMAL HIGH (ref <1000)
LDL Size: 20.7 nm (ref >20.5)
LDLC SERPL CALC-MCNC: 95 mg/dL (ref <100)
LP-IR SCORE: 39 (ref <=45)
Small LDL Particle Number: 528 nmol/L — ABNORMAL HIGH (ref <=527)
Triglycerides by NMR: 130 mg/dL (ref <150)

## 2013-11-16 LAB — PAP IG (IMAGE GUIDED): PAP SMEAR COMMENT: 0

## 2013-11-16 LAB — THYROID PANEL WITH TSH
Free Thyroxine Index: 1.8 (ref 1.2–4.9)
T3 Uptake Ratio: 26 % (ref 24–39)
T4, Total: 6.8 ug/dL (ref 4.5–12.0)
TSH: 1.77 u[IU]/mL (ref 0.450–4.500)

## 2013-11-16 NOTE — Telephone Encounter (Signed)
Left message to return call about lab results.

## 2013-11-16 NOTE — Telephone Encounter (Signed)
Message copied by Ilean China on Thu Nov 16, 2013 10:12 AM ------      Message from: Chevis Pretty      Created: Thu Nov 16, 2013  8:14 AM       Cbc normal      Kidney and liver function stable      ldl particle numbers are improving      Urine clear      Thyroid normal      Continue current meds- low fat diet and exercise and recheck in 3 months      Pap results are not back yet ------

## 2013-11-21 ENCOUNTER — Encounter: Payer: BC Managed Care – PPO | Admitting: Pharmacist Clinician (PhC)/ Clinical Pharmacy Specialist

## 2013-12-15 ENCOUNTER — Telehealth: Payer: Self-pay | Admitting: Nurse Practitioner

## 2013-12-15 NOTE — Telephone Encounter (Signed)
ntbs- a pap will not cause pelvic pain

## 2013-12-15 NOTE — Telephone Encounter (Signed)
Pelvic pain began soon after having pap smear. The pain is intermittent and hasn't changed since onset. Denies odor or discharge.

## 2013-12-18 NOTE — Telephone Encounter (Signed)
Appointment made at 2

## 2013-12-18 NOTE — Telephone Encounter (Signed)
Spoke with patient and she is aware you want to see her, but she said it is a UTI and would like for you to just call in an antibiotic if you would

## 2013-12-18 NOTE — Telephone Encounter (Signed)
NTBS  To look at urine

## 2013-12-21 ENCOUNTER — Encounter: Payer: Self-pay | Admitting: General Practice

## 2013-12-21 ENCOUNTER — Ambulatory Visit (INDEPENDENT_AMBULATORY_CARE_PROVIDER_SITE_OTHER): Payer: Medicare Other | Admitting: General Practice

## 2013-12-21 VITALS — BP 123/54 | HR 76 | Temp 96.9°F | Ht 61.0 in | Wt 142.6 lb

## 2013-12-21 DIAGNOSIS — R300 Dysuria: Secondary | ICD-10-CM

## 2013-12-21 DIAGNOSIS — N390 Urinary tract infection, site not specified: Secondary | ICD-10-CM

## 2013-12-21 LAB — POCT UA - MICROSCOPIC ONLY
Bacteria, U Microscopic: NEGATIVE
CASTS, UR, LPF, POC: NEGATIVE
Crystals, Ur, HPF, POC: NEGATIVE
Mucus, UA: NEGATIVE
RBC, URINE, MICROSCOPIC: NEGATIVE
WBC, Ur, HPF, POC: NEGATIVE
YEAST UA: NEGATIVE

## 2013-12-21 LAB — POCT URINALYSIS DIPSTICK
Bilirubin, UA: NEGATIVE
Blood, UA: NEGATIVE
GLUCOSE UA: NEGATIVE
Ketones, UA: NEGATIVE
Leukocytes, UA: NEGATIVE
Nitrite, UA: NEGATIVE
Protein, UA: NEGATIVE
Spec Grav, UA: 1.02
UROBILINOGEN UA: NEGATIVE
pH, UA: 6

## 2013-12-21 MED ORDER — CIPROFLOXACIN HCL 500 MG PO TABS: 500.0000 mg | ORAL_TABLET | Freq: Two times a day (BID) | ORAL | Status: DC

## 2013-12-21 NOTE — Progress Notes (Signed)
   Subjective:    Patient ID: Wendy Palmer, female    DOB: [birth year], 63 y.o.   MRN: [id number]  Urinary Frequency  This is a new problem. The current episode started in the past 7 days. The problem occurs intermittently. The problem has been gradually worsening. The quality of the pain is described as burning. The pain is at a severity of 3/10. There has been no fever. She is sexually active. There is no history of pyelonephritis. Associated symptoms include frequency and urgency. Pertinent negatives include no chills, hematuria, nausea or vomiting. She has tried nothing for the symptoms. There is no history of recurrent UTIs or a urological procedure.      Review of Systems  Constitutional: Negative for chills.  Respiratory: Negative for chest tightness and shortness of breath.   Cardiovascular: Negative for chest pain and palpitations.  Gastrointestinal: Positive for abdominal pain. Negative for nausea, vomiting and blood in stool.  Genitourinary: Positive for dysuria, urgency and frequency. Negative for hematuria.  Neurological: Negative for dizziness, weakness and headaches.       Objective:   Physical Exam  Constitutional: She is oriented to person, place, and time. She appears well-developed and well-nourished.  Cardiovascular: Normal rate, regular rhythm and normal heart sounds.   Pulmonary/Chest: Effort normal and breath sounds normal.  Abdominal: Soft. Bowel sounds are normal. She exhibits no distension. There is tenderness in the suprapubic area. There is no CVA tenderness.  Neurological: She is alert and oriented to person, place, and time.  Skin: Skin is warm and dry.  Psychiatric: She has a normal mood and affect.     Results for orders placed in visit on 12/21/13  POCT URINALYSIS DIPSTICK      Result Value Ref Range   Color, UA yellow     Clarity, UA clear     Glucose, UA neg     Bilirubin, UA neg     Ketones, UA neg     Spec Grav, UA 1.020     Blood, UA neg      pH, UA 6.0     Protein, UA neg     Urobilinogen, UA negative     Nitrite, UA neg     Leukocytes, UA Negative    POCT UA - MICROSCOPIC ONLY      Result Value Ref Range   WBC, Ur, HPF, POC neg     RBC, urine, microscopic neg     Bacteria, U Microscopic neg     Mucus, UA neg     Epithelial cells, urine per micros occ     Crystals, Ur, HPF, POC neg     Casts, Ur, LPF, POC neg     Yeast, UA neg          Assessment & Plan:  1. Dysuria - POCT urinalysis dipstick - POCT UA - Microscopic Only  2. UTI (urinary tract infection)  - ciprofloxacin (CIPRO) 500 MG tablet; Take 1 tablet (500 mg total) by mouth 2 (two) times daily.  Dispense: 14 tablet; Refill: 0 -Increase fluid intake Frequent voiding Proper perineal hygiene RTO prn Patient verbalized understanding Erby Pian, FNP-C

## 2013-12-21 NOTE — Patient Instructions (Signed)
Urinary Tract Infection  Urinary tract infections (UTIs) can develop anywhere along your urinary tract. Your urinary tract is your body's drainage system for removing wastes and extra water. Your urinary tract includes two kidneys, two ureters, a bladder, and a urethra. Your kidneys are a pair of bean-shaped organs. Each kidney is about the size of your fist. They are located below your ribs, one on each side of your spine.  CAUSES  Infections are caused by microbes, which are microscopic organisms, including fungi, viruses, and bacteria. These organisms are so small that they can only be seen through a microscope. Bacteria are the microbes that most commonly cause UTIs.  SYMPTOMS   Symptoms of UTIs may vary by age and gender of the patient and by the location of the infection. Symptoms in young women typically include a frequent and intense urge to urinate and a painful, burning feeling in the bladder or urethra during urination. Older women and men are more likely to be tired, shaky, and weak and have muscle aches and abdominal pain. A fever may mean the infection is in your kidneys. Other symptoms of a kidney infection include pain in your back or sides below the ribs, nausea, and vomiting.  DIAGNOSIS  To diagnose a UTI, your caregiver will ask you about your symptoms. Your caregiver also will ask to provide a urine sample. The urine sample will be tested for bacteria and white blood cells. White blood cells are made by your body to help fight infection.  TREATMENT   Typically, UTIs can be treated with medication. Because most UTIs are caused by a bacterial infection, they usually can be treated with the use of antibiotics. The choice of antibiotic and length of treatment depend on your symptoms and the type of bacteria causing your infection.  HOME CARE INSTRUCTIONS   If you were prescribed antibiotics, take them exactly as your caregiver instructs you. Finish the medication even if you feel better after you  have only taken some of the medication.   Drink enough water and fluids to keep your urine clear or pale yellow.   Avoid caffeine, tea, and carbonated beverages. They tend to irritate your bladder.   Empty your bladder often. Avoid holding urine for long periods of time.   Empty your bladder before and after sexual intercourse.   After a bowel movement, women should cleanse from front to back. Use each tissue only once.  SEEK MEDICAL CARE IF:    You have back pain.   You develop a fever.   Your symptoms do not begin to resolve within 3 days.  SEEK IMMEDIATE MEDICAL CARE IF:    You have severe back pain or lower abdominal pain.   You develop chills.   You have nausea or vomiting.   You have continued burning or discomfort with urination.  MAKE SURE YOU:    Understand these instructions.   Will watch your condition.   Will get help right away if you are not doing well or get worse.  Document Released: 05/27/2005 Document Revised: 02/16/2012 Document Reviewed: 09/25/2011  [employer] Patient Information 2014 [employer].

## 2014-02-06 ENCOUNTER — Ambulatory Visit (INDEPENDENT_AMBULATORY_CARE_PROVIDER_SITE_OTHER): Payer: Medicare Other | Admitting: Pharmacist Clinician (PhC)/ Clinical Pharmacy Specialist

## 2014-02-06 ENCOUNTER — Encounter: Payer: Self-pay | Admitting: Pharmacist Clinician (PhC)/ Clinical Pharmacy Specialist

## 2014-02-06 DIAGNOSIS — I10 Essential (primary) hypertension: Secondary | ICD-10-CM

## 2014-02-06 NOTE — Progress Notes (Signed)
Saman Giddens comes in today with concerns about feeling tired and decreased exercise tolerance.  She also complains of frequent hot spells (not flashes per patient).  Her TSH and BG are normal with no signs of endocrine problems on her labs.  Patient's BP is 120/77 and tends to run around that number per patient, she has a home BPcuff.  Since patient is taking a low dose beta blocker for hypertension and not tachycardia, MI, etc I will attempt to wean her off atenolol 25mg  and see if her BP stays within normal limits she will not need an additional therapy.  If it increases substantially then amlodipine 2.5mg  will be started (she has tried ACEI and had cough that was disruptive).  Patient is given a written schedule to take 12.5mg  of atenolol for 1 week then stop.  We also discussed stopping her HRT since she has taken it for over 10 years and trying neurontin.  We will wait to try that change in 3-4 weeks once her BP medication issues are resolved.  Discussed exercise and healthy eating with patient.

## 2014-02-15 ENCOUNTER — Telehealth: Payer: Self-pay | Admitting: Family Medicine

## 2014-02-15 MED ORDER — ESTRADIOL-NORETHINDRONE ACET 1-0.5 MG PO TABS: 1.0000 | ORAL_TABLET | Freq: Every day | ORAL | Status: DC

## 2014-02-15 NOTE — Telephone Encounter (Signed)
done

## 2014-02-16 ENCOUNTER — Telehealth: Payer: Self-pay | Admitting: Family Medicine

## 2014-02-16 NOTE — Telephone Encounter (Signed)
Pt notified to contact [facility] Number given Pt verbalizes understanding

## 2014-02-17 NOTE — Telephone Encounter (Signed)
Patient told to go ahead and fill for now. Will have clinical pharmacist look at this to see what can cnange to.

## 2014-02-22 ENCOUNTER — Telehealth: Payer: Self-pay | Admitting: Family Medicine

## 2014-02-26 NOTE — Telephone Encounter (Signed)
BP reading sat 127/71 P91, 131/71, 144/71 p-76 Friday 141/71, 137/79 p-93 Sunday 146/78 p-105, 134/79 p-93,  Monday 150/78 p-87 138/80 p-85 Please advise patient was taken off of the atenolol due to having severe fatigue. Please advise

## 2014-02-27 ENCOUNTER — Other Ambulatory Visit: Payer: Self-pay | Admitting: Pharmacist Clinician (PhC)/ Clinical Pharmacy Specialist

## 2014-02-27 MED ORDER — AMLODIPINE BESYLATE 2.5 MG PO TABS: 2.5000 mg | ORAL_TABLET | Freq: Every day | ORAL | Status: DC

## 2014-02-27 NOTE — Telephone Encounter (Signed)
Called and left message for patient that I called amlodipine 2.5mg  qd to [facility].  I reviewed goals and possible adverse effects and instructed patient to continue to check home BP readings and call me in a week.

## 2014-03-05 ENCOUNTER — Telehealth: Payer: Self-pay | Admitting: Family Medicine

## 2014-03-05 NOTE — Telephone Encounter (Signed)
You can tell Mrs.Venus that Dr. Roselie Awkward comes next-door and the other group we use Dr. Corinna Capra group in [place]

## 2014-03-05 NOTE — Telephone Encounter (Signed)
Spoke with pt regarding GYN

## 2014-03-05 NOTE — Telephone Encounter (Signed)
Spoke with pt to give names and numbers for GYNs

## 2014-03-05 NOTE — Telephone Encounter (Signed)
Left pt the names of the Drs recommended by [facility]

## 2014-03-09 ENCOUNTER — Telehealth: Payer: Self-pay | Admitting: Pharmacist Clinician (PhC)/ Clinical Pharmacy Specialist

## 2014-03-09 NOTE — Telephone Encounter (Signed)
Patients bp yest was 155/83 p83 and Bp today was 143/77 p96 should she take 2 amlodipine today or wait til appt on tues with michelle

## 2014-03-09 NOTE — Telephone Encounter (Signed)
Ok to wait for appointment

## 2014-03-13 ENCOUNTER — Ambulatory Visit (INDEPENDENT_AMBULATORY_CARE_PROVIDER_SITE_OTHER): Payer: Medicare Other | Admitting: Pharmacist

## 2014-03-13 ENCOUNTER — Encounter: Payer: Self-pay | Admitting: Pharmacist

## 2014-03-13 VITALS — BP 134/72 | HR 82 | Ht 61.0 in | Wt 142.0 lb

## 2014-03-13 DIAGNOSIS — I10 Essential (primary) hypertension: Secondary | ICD-10-CM

## 2014-03-13 DIAGNOSIS — N951 Menopausal and female climacteric states: Secondary | ICD-10-CM

## 2014-03-13 MED ORDER — AMLODIPINE BESYLATE 5 MG PO TABS: 5.0000 mg | ORAL_TABLET | Freq: Every day | ORAL | Status: DC

## 2014-03-13 MED ORDER — GABAPENTIN 100 MG PO CAPS: 100.0000 mg | ORAL_CAPSULE | Freq: Three times a day (TID) | ORAL | Status: DC

## 2014-03-13 NOTE — Progress Notes (Signed)
CC:  HTN  HPI: Patient was seen by clinical pharmacist Memory Argue, Pharm D, CPP for medication management about 2 months ago.  Patient was c/o fatigue.  At that time atenolol was discontinued because BP was controlled and thought this could be causing fatigue and poor exercise tolerance.  Stopping HRT and switching to gabapentin instead was also discussed but this change has not been made yet.   Patient states that she always feels hot.  Her fatigue has improved but her BP at home has been elevated - usually 150's / 70's even after amlodipine was started about 1 month ago.   She is walking 5 miles/day and eats fairly healthy / low salt diet.  Negative edema today  Current Outpatient Prescriptions on File Prior to Visit  Medication Sig Dispense Refill  . aspirin 81 MG tablet Take 81 mg by mouth daily.        Marland Kitchen estradiol-norethindrone (MIMVEY) 1-0.5 MG per tablet Take 1 tablet by mouth daily.  28 tablet  5  . FINACEA 15 % cream Apply 1 application topically 2 (two) times daily.      Marland Kitchen imipramine (TOFRANIL) 50 MG tablet TAKE 2 TABLETS BY MOUTH AT BEDTIME  60 tablet  11  . minocycline (MINOCIN,DYNACIN) 100 MG capsule Take 1 capsule by mouth daily.      . Omega-3 Fatty Acids (FISH OIL PO) Take 1 capsule by mouth daily.        . Biotin 1 MG CAPS Take 1 tablet by mouth daily.      . Cholecalciferol (VITAMIN D) 1000 UNITS capsule Take 1,000 Units by mouth daily.      . Cyanocobalamin (B-12 PO) Take by mouth daily.       Amlodipine 2.5mg  Take 1 tablet daily    . Multiple Vitamin (MULTIVITAMIN) tablet Take 1 tablet by mouth daily.      . naproxen sodium (ANAPROX) 220 MG tablet Take 220 mg by mouth daily.       No current facility-administered medications on file prior to visit.   Filed Vitals:   03/13/14 1452  BP: 134/72  Pulse: 82   Filed Weights   03/13/14 1452  Weight: 142 lb (64.411 kg)   Body mass index is 26.84 kg/(m^2).   Assessment: HTN - uncontrolled at home thought BP  was good in office today Post menopausal / menopausal with continued hot flashes but desire to come off HRT  Plan: Increase amlopidine to 5mg  daily - reminded to monitor for side effects - dizzziness, edema. Continue to monitor BP (goal is less than 140 /90) Taper off current HRT (take 1/2 tablet for next 2-4 weeks, then stop) Start gabapentin 100mg  1 capsules daily (patient is planning to wait to make changes to HRT and gabapentin until after trip to [place] in August) RTC in 6 weeks to recheck BP  Cherre Robins, PharmD, CPP

## 2014-03-13 NOTE — Patient Instructions (Signed)
Hypertension Blood pressure goal - less than 140/90 Hypertension, commonly called high blood pressure, is when the force of blood pumping through your arteries is too strong. Your arteries are the blood vessels that carry blood from your heart throughout your body. A blood pressure reading consists of a higher number over a lower number, such as 110/72. The higher number (systolic) is the pressure inside your arteries when your heart pumps. The lower number (diastolic) is the pressure inside your arteries when your heart relaxes. Ideally you want your blood pressure below 120/80. Hypertension forces your heart to work harder to pump blood. Your arteries may become narrow or stiff. Having hypertension puts you at risk for heart disease, stroke, and other problems.  RISK FACTORS Some risk factors for high blood pressure are controllable. Others are not.  Risk factors you cannot control include:   Race. You may be at higher risk if you are African American.  Age. Risk increases with age.  Gender. Men are at higher risk than women before age 85 years. After age 37, women are at higher risk than men. Risk factors you can control include:  Not getting enough exercise or physical activity.  Being overweight.  Getting too much fat, sugar, calories, or salt in your diet.  Drinking too much alcohol. SIGNS AND SYMPTOMS Hypertension does not usually cause signs or symptoms. Extremely high blood pressure (hypertensive crisis) may cause headache, anxiety, shortness of breath, and nosebleed. DIAGNOSIS  To check if you have hypertension, your health care provider will measure your blood pressure while you are seated, with your arm held at the level of your heart. It should be measured at least twice using the same arm. Certain conditions can cause a difference in blood pressure between your right and left arms. A blood pressure reading that is higher than normal on one occasion does not mean that you need  treatment. If one blood pressure reading is high, ask your health care provider about having it checked again. TREATMENT  Treating high blood pressure includes making lifestyle changes and possibly taking medication. Living a healthy lifestyle can help lower high blood pressure. You may need to change some of your habits. Lifestyle changes may include:  Following the DASH diet. This diet is high in fruits, vegetables, and whole grains. It is low in salt, red meat, and added sugars.  Getting at least 2 1/2 hours of brisk physical activity every week.  Losing weight if necessary.  Not smoking.  Limiting alcoholic beverages.  Learning ways to reduce stress. If lifestyle changes are not enough to get your blood pressure under control, your health care provider may prescribe medicine. You may need to take more than one. Work closely with your health care provider to understand the risks and benefits. HOME CARE INSTRUCTIONS  Have your blood pressure rechecked as directed by your health care provider.   Only take medicine as directed by your health care provider. Follow the directions carefully. Blood pressure medicines must be taken as prescribed. The medicine does not work as well when you skip doses. Skipping doses also puts you at risk for problems.   Do not smoke.   Monitor your blood pressure at home as directed by your health care provider. SEEK MEDICAL CARE IF:   You think you are having a reaction to medicines taken.  You have recurrent headaches or feel dizzy.  You have swelling in your ankles.  You have trouble with your vision. SEEK IMMEDIATE MEDICAL CARE IF:  You develop a severe headache or confusion.  You have unusual weakness, numbness, or feel faint.  You have severe chest or abdominal pain.  You vomit repeatedly.  You have trouble breathing. MAKE SURE YOU:   Understand these instructions.  Will watch your condition.  Will get help right away if you  are not doing well or get worse. Document Released: 08/17/2005 Document Revised: 08/22/2013 Document Reviewed: 06/09/2013 [facility] Patient Information 2015 [place], [place]. This information is not intended to replace advice given to you by your health care provider. Make sure you discuss any questions you have with your health care provider.   Try Neurontin / gabapentin 100mg  1 tablet/capsule at bedtime (will replace hormone replacement) Decrease hormone replacement to 1/2 tablet daily for 2 to 4 weeks then stop. Increase amlodipine to 5mg  (can take 2 tablets of 2.5mg )

## 2014-03-15 ENCOUNTER — Telehealth: Payer: Self-pay | Admitting: Pharmacist

## 2014-03-15 NOTE — Telephone Encounter (Signed)
Home BP readings since increase amlodipine to 5mg  daily 136/80, 155/88, 148/78 Patient to continue amlodipine 5mg  daily and will call next week with more readings.

## 2014-03-21 ENCOUNTER — Telehealth: Payer: Self-pay | Admitting: Nurse Practitioner

## 2014-03-22 MED ORDER — AMLODIPINE BESYLATE 2.5 MG PO TABS: 2.5000 mg | ORAL_TABLET | Freq: Every day | ORAL | Status: DC

## 2014-03-22 NOTE — Telephone Encounter (Signed)
Patient tried amlodipine 5mg  - she felt very tired. She wants to change back to 2.5mg  daily.  She will continue to monitor BP daily and call next week with readings

## 2014-03-28 ENCOUNTER — Telehealth: Payer: Self-pay | Admitting: Family Medicine

## 2014-03-28 NOTE — Telephone Encounter (Signed)
Patient calls to let me know she restarted amlodipine at 2.5mg  daily but BP is above goals again (she didn't have her home BP reading avaialble when I called but she did mention that they were high)  She was unable to tolerate amlodipine 5mg  - mader her extremely tired after 1 dose.  I recommended she try 1 and 1/2 tablets of amlopidine 2.5 mg ( total dose = 3.75mg  daily).

## 2014-03-29 ENCOUNTER — Telehealth: Payer: Self-pay | Admitting: Pharmacist

## 2014-03-29 NOTE — Telephone Encounter (Signed)
Tried to call - left message  

## 2014-03-29 NOTE — Telephone Encounter (Signed)
Patient states that she has been feeling tired during the day since taking amoldipine.  She is afraid to increase dose to 3.75mg  daily.  I suggested that she move dose to bedtime.   Tonight she will take 1/2 tablet (=1.25mg ) since she took 2.5 mg this am.  Then starting tomorrow night she will take 1 and 1/2 tablets of 2.5mg  = 3.75mg  at bedtime

## 2014-04-17 ENCOUNTER — Telehealth: Payer: Self-pay | Admitting: Family Medicine

## 2014-04-17 NOTE — Telephone Encounter (Signed)
Talked with patient about peripheral edema since being on plane for 16 hours to [place].  Her edema is in both ankles with no pain and redness.  She has notices an improvement from yesterday to this morning.  Since amlodipine can cause this side effect and her diet was different I asked to to continue amlodipine 2.5mg  and if there was not continued improvement in her edema to call back on Thursday.  I reviewed signs of blood clots and she was negative for any symptoms associated with them.

## 2014-05-10 ENCOUNTER — Encounter: Payer: Self-pay | Admitting: Pharmacist

## 2014-05-10 ENCOUNTER — Ambulatory Visit (INDEPENDENT_AMBULATORY_CARE_PROVIDER_SITE_OTHER): Payer: Medicare Other | Admitting: Pharmacist

## 2014-05-10 VITALS — BP 138/86 | HR 78 | Ht 61.0 in | Wt 142.0 lb

## 2014-05-10 DIAGNOSIS — I10 Essential (primary) hypertension: Secondary | ICD-10-CM

## 2014-05-10 DIAGNOSIS — R0602 Shortness of breath: Secondary | ICD-10-CM

## 2014-05-10 NOTE — Progress Notes (Signed)
CC:  HTN  HPI: Patient was seen by myself about 6 weeks ago for HTN.  Patient had c/o fatigue but this has improved.  Though she does voice concern about getting SOB when going up an incline or stairs - this has been on going for the last 6 months to 1 year.  She is walking 2.5-3 miles/day and eats fairly healthy / low salt diet.  Negative edema today  Positive family history of CAD  Current Outpatient Prescriptions on File Prior to Visit  Medication Sig Dispense Refill  . aspirin 81 MG tablet Take 81 mg by mouth daily.        Marland Kitchen estradiol-norethindrone (MIMVEY) 1-0.5 MG per tablet Take 1 tablet by mouth daily.  28 tablet  5  . FINACEA 15 % cream Apply 1 application topically 2 (two) times daily.      Marland Kitchen imipramine (TOFRANIL) 50 MG tablet TAKE 2 TABLETS BY MOUTH AT BEDTIME  60 tablet  11  . minocycline (MINOCIN,DYNACIN) 100 MG capsule Take 1 capsule by mouth daily.      . Omega-3 Fatty Acids (FISH OIL PO) Take 1 capsule by mouth daily.        . Biotin 1 MG CAPS Take 1 tablet by mouth daily.      . Cholecalciferol (VITAMIN D) 1000 UNITS capsule Take 1,000 Units by mouth daily.      . Cyanocobalamin (B-12 PO) Take by mouth daily.       Amlodipine 2.5mg  Take 1 tablet daily    . Multiple Vitamin (MULTIVITAMIN) tablet Take 1 tablet by mouth daily.      . naproxen sodium (ANAPROX) 220 MG tablet Take 220 mg by mouth daily.       No current facility-administered medications on file prior to visit.   Filed Vitals:   05/10/14 1241  BP: 138/86  Pulse: 78   Filed Weights   05/10/14 1241  Weight: 142 lb (64.411 kg)   Body mass index is 26.84 kg/(m^2).   Assessment: HTN - controlled SOB  Plan: Continue amlopidine 2.5mg  daily  Continue to monitor BP (goal is less than 140 /90) Discussed SOB with her PCP - per his recommendation sent referral for cardiology consult. RTC in 6 to 8  weeks to recheck BP  Cherre Robins, PharmD, CPP

## 2014-05-23 ENCOUNTER — Telehealth: Payer: Self-pay | Admitting: Family Medicine

## 2014-05-23 MED ORDER — AZITHROMYCIN 250 MG PO TABS: ORAL_TABLET | ORAL | Status: DC

## 2014-05-23 NOTE — Telephone Encounter (Signed)
Patient aware.

## 2014-05-23 NOTE — Telephone Encounter (Signed)
Medication sent to pharmacy. Unable to reach patient at home.

## 2014-05-23 NOTE — Telephone Encounter (Signed)
Gargle with warm salty water Rest her voice Drink plenty of fluids Call a prescription in for a Z-Pak to take as directed

## 2014-06-06 ENCOUNTER — Encounter: Payer: Self-pay | Admitting: *Deleted

## 2014-06-08 ENCOUNTER — Encounter: Payer: Self-pay | Admitting: Cardiology

## 2014-06-08 ENCOUNTER — Ambulatory Visit (INDEPENDENT_AMBULATORY_CARE_PROVIDER_SITE_OTHER): Payer: Medicare Other | Admitting: Cardiology

## 2014-06-08 VITALS — BP 138/78 | HR 92 | Ht 62.0 in | Wt 139.0 lb

## 2014-06-08 DIAGNOSIS — R0789 Other chest pain: Secondary | ICD-10-CM

## 2014-06-08 DIAGNOSIS — R0602 Shortness of breath: Secondary | ICD-10-CM

## 2014-06-08 DIAGNOSIS — R0600 Dyspnea, unspecified: Secondary | ICD-10-CM

## 2014-06-08 NOTE — Patient Instructions (Signed)
Your physician recommends that you schedule a follow-up appointment  As needed with Dr. Percival Spanish

## 2014-06-08 NOTE — Progress Notes (Signed)
HPI The patient presents for evaluation of dyspnea. She has no past cardiac history. However, she does have a family history of early coronary artery disease. She has had some dyspnea. This seems to be a relatively stable pattern. She says she will get short of breath climbing up an incline or upstairs. She can make it to the top of the stairs. However, she does get short of breath and has to rest for a minute. She thinks this has been stable. She was a walker and has been doing this and has noticed is slowly progressing or perhaps slightly stable over the last couple of years. She denies any chest pressure, neck or arm discomfort. She doesn't notice any palpitations, presyncope or syncope. She has no PND or orthopnea. She's had a little bit of weight gain but no edema.  Allergies  Allergen Reactions  . Macrobid [Nitrofurantoin Macrocrystal] Rash  . Ace Inhibitors Cough    Current Outpatient Prescriptions  Medication Sig Dispense Refill  . amLODipine (NORVASC) 2.5 MG tablet Take 2.5 mg by mouth daily.   90 tablet  3  . aspirin 81 MG tablet Take 81 mg by mouth daily.        Marland Kitchen azithromycin (ZITHROMAX) 250 MG tablet Two tablets on 1st day and one tablet on day 2-5.  6 tablet  0  . Biotin 1 MG CAPS Take 1 tablet by mouth daily.      . Cholecalciferol (VITAMIN D) 1000 UNITS capsule Take 1,000 Units by mouth daily.      . Cyanocobalamin (B-12 PO) Take by mouth daily.      Marland Kitchen estradiol-norethindrone (MIMVEY) 1-0.5 MG per tablet Take 1 tablet by mouth daily.  28 tablet  5  . FINACEA 15 % cream Apply 1 application topically 2 (two) times daily.      Marland Kitchen gabapentin (NEURONTIN) 100 MG capsule Take 1 capsule (100 mg total) by mouth 3 (three) times daily.  90 capsule  1  . imipramine (TOFRANIL) 50 MG tablet TAKE 2 TABLETS BY MOUTH AT BEDTIME  60 tablet  11  . minocycline (MINOCIN,DYNACIN) 100 MG capsule Take 1 capsule by mouth daily.      . Multiple Vitamin (MULTIVITAMIN) tablet Take 1 tablet by mouth  daily.      . naproxen sodium (ANAPROX) 220 MG tablet Take 220 mg by mouth daily.      . Omega-3 Fatty Acids (FISH OIL PO) Take 1 capsule by mouth daily.        . valACYclovir (VALTREX) 500 MG tablet Take 500 mg by mouth daily as needed.       No current facility-administered medications for this visit.    Past Medical History  Diagnosis Date  . Diverticulosis   . Depression   . Patella fracture   . Hypertension     Past Surgical History  Procedure Laterality Date  . Tubal ligation    . Refractive surgery      Family History  Problem Relation Age of Onset  . Colon cancer Mother   . Heart failure Father     CHF, lived to be 76  . Alzheimer's disease Mother   . CAD Brother 64  . Heart disease Maternal Grandfather 42  . Heart disease Maternal Grandmother 52    History   Social History  . Marital Status: Married    Spouse Name: N/A    Number of Children: 2  . Years of Education: N/A   Occupational History  . Teacher  Social History Main Topics  . Smoking status: Former Smoker    Types: Cigarettes    Quit date: 09/01/1975  . Smokeless tobacco: Never Used  . Alcohol Use: 0.5 oz/week    1 drink(s) per week  . Drug Use: No  . Sexual Activity: Not on file   Other Topics Concern  . Not on file   Social History Narrative   Lives alone.      ROS:  As stated in the HPI and negative for all other systems.  PHYSICAL EXAM BP 138/78  Pulse 92  Ht 5\' 2"  (1.575 m)  Wt 139 lb (63.05 kg)  BMI 25.42 kg/m2 GENERAL:  Well appearing HEENT:  Pupils equal round and reactive, fundi not visualized, oral mucosa unremarkable NECK:  No jugular venous distention, waveform within normal limits, carotid upstroke brisk and symmetric, no bruits, no thyromegaly LYMPHATICS:  No cervical, inguinal adenopathy LUNGS:  Clear to auscultation bilaterally BACK:  No CVA tenderness CHEST:  Unremarkable HEART:  PMI not displaced or sustained,S1 and S2 within normal limits, no S3, no S4,  no clicks, no rubs, no murmurs ABD:  Flat, positive bowel sounds normal in frequency in pitch, no bruits, no rebound, no guarding, no midline pulsatile mass, no hepatomegaly, no splenomegaly EXT:  2 plus pulses throughout, no edema, no cyanosis no clubbing SKIN:  No rashes no nodules NEURO:  Cranial nerves II through XII grossly intact, motor grossly intact throughout PSYCH:  Cognitively intact, oriented to person place and time   EKG:  Sinus rhythm, rate 92, leftward axis deviation, no acute ST-T wave changes. 06/08/2014  ASSESSMENT AND PLAN  DYSPNEA:  This is mild. However, given her family history I think it is reasonable to consider obstructive coronary disease as an etiology. I will bring the patient back for a POET (Plain Old Exercise Test). This will allow me to screen for obstructive coronary disease, risk stratify and very importantly provide a prescription for exercise.  RISK REDUCTION:  She did have a reasonable lipid profile. We talked about exercise. Further evaluation as above.  Lab Results  Component Value Date   CHOL 184 11/14/2013   TRIG 130 11/14/2013   HDL 63 11/14/2013   Castana 95 11/14/2013

## 2014-07-04 ENCOUNTER — Telehealth (HOSPITAL_COMMUNITY): Payer: Self-pay

## 2014-07-04 NOTE — Telephone Encounter (Signed)
Encounter complete. 

## 2014-07-05 ENCOUNTER — Telehealth (HOSPITAL_COMMUNITY): Payer: Self-pay

## 2014-07-05 NOTE — Telephone Encounter (Signed)
Encounter complete. 

## 2014-07-06 ENCOUNTER — Ambulatory Visit (HOSPITAL_COMMUNITY)
Admission: RE | Admit: 2014-07-06 | Discharge: 2014-07-06 | Disposition: A | Discharge status: Home / Self Care | Payer: Medicare Other | Source: Ambulatory Visit | Attending: Cardiology | Admitting: Cardiology

## 2014-07-06 DIAGNOSIS — R0602 Shortness of breath: Secondary | ICD-10-CM | POA: Diagnosis not present

## 2014-07-06 DIAGNOSIS — R0789 Other chest pain: Secondary | ICD-10-CM

## 2014-07-06 NOTE — Telephone Encounter (Signed)
Encounter complete. 

## 2014-07-06 NOTE — Procedures (Signed)
Exercise Treadmill Test   Test  Exercise Tolerance Test Ordering MD: Marijo File, MD    Unique Test No: 1  Treadmill:  1  Indication for ETT: exertional dyspnea  Contraindication to ETT: No   Stress Modality: exercise - treadmill  Cardiac Imaging Performed: non   Protocol: standard Bruce - maximal  Max BP:  191/100  Max MPHR (bpm):  155 85% MPR (bpm):  131  MPHR obtained (bpm):  153 % MPHR obtained:  98  Reached 85% MPHR (min:sec):  3:10 Total Exercise Time (min-sec):  8  Workload in METS:  10.1 Borg Scale: 15  Reason ETT Terminated:  SOB and Fatigue    ST Segment Analysis At Rest: normal ST segments - no evidence of significant ST depression With Exercise: no evidence of significant ST depression  Other Information Arrhythmia:  No Angina during ETT:  absent (0) Quality of ETT:  diagnostic  ETT Interpretation:  normal - no evidence of ischemia by ST analysis  Comments: The patient had an excellent exercise tolerance.  There was no chest pain.  There was an appropriate level of dyspnea.  There were no arrhythmias, a normal heart rate response and an accelerated BP response.  There were no ischemic ST T wave changes and a normal heart rate recovery.  Recommendations: Negative adequate ETT.  No further testing is indicated.  She did have an accelerated BP response.

## 2014-07-09 ENCOUNTER — Telehealth: Payer: Self-pay | Admitting: *Deleted

## 2014-07-09 NOTE — Telephone Encounter (Signed)
Informed patient of ETT results and recommendations. Patient voiced understanding of the orders.

## 2014-07-09 NOTE — Telephone Encounter (Signed)
-----   Message from Minus Breeding, MD sent at 07/08/2014  3:33 PM EST ----- She had an increased BP with exertion.  I would suggest that she increase her Norvasc to 5 mg po daily.

## 2014-07-12 ENCOUNTER — Ambulatory Visit: Payer: Self-pay

## 2014-07-23 ENCOUNTER — Ambulatory Visit: Payer: Self-pay

## 2014-08-06 ENCOUNTER — Ambulatory Visit: Payer: Self-pay

## 2014-08-13 ENCOUNTER — Encounter: Payer: Self-pay | Admitting: Family Medicine

## 2014-08-13 ENCOUNTER — Ambulatory Visit: Payer: Self-pay

## 2014-08-13 ENCOUNTER — Ambulatory Visit: Payer: Medicare Other | Admitting: Nurse Practitioner

## 2014-08-13 ENCOUNTER — Ambulatory Visit (INDEPENDENT_AMBULATORY_CARE_PROVIDER_SITE_OTHER): Payer: Medicare Other | Admitting: Family Medicine

## 2014-08-13 VITALS — BP 131/74 | HR 92 | Temp 96.8°F | Ht 62.0 in | Wt 140.0 lb

## 2014-08-13 DIAGNOSIS — R0981 Nasal congestion: Secondary | ICD-10-CM

## 2014-08-13 DIAGNOSIS — B349 Viral infection, unspecified: Secondary | ICD-10-CM

## 2014-08-13 DIAGNOSIS — R52 Pain, unspecified: Secondary | ICD-10-CM

## 2014-08-13 DIAGNOSIS — J209 Acute bronchitis, unspecified: Secondary | ICD-10-CM

## 2014-08-13 LAB — POCT INFLUENZA A/B
INFLUENZA A, POC: NEGATIVE
Influenza B, POC: NEGATIVE

## 2014-08-13 MED ORDER — AZITHROMYCIN 250 MG PO TABS: ORAL_TABLET | ORAL | Status: DC

## 2014-08-13 NOTE — Patient Instructions (Addendum)
Drink plenty of fluids Take Tylenol or ibuprofen as needed for aches pains and fever Take Mucinex, maximum strength, blue and white in color, over-the-counter, 1 twice daily with a large glass of water for cough and congestion Use saline nose spray for head congestion Take antibiotic as directed

## 2014-08-13 NOTE — Progress Notes (Signed)
Subjective:    Patient ID: Wendy Palmer, female    DOB: 28-Sep-1950, 63 y.o.   MRN: [id number]  HPI Patient here today for cold, congestion, and body aches that started about 1 week ago. The patient also complains of fever although she has none today. The chest congestion has been sparse. She has also had some headaches.   Patient Active Problem List   Diagnosis Date Noted  . Dyspnea 06/08/2014  . Hot flashes, menopausal 03/13/2014  . Rosacea 12/21/2012  . Hypertension 12/21/2012  . Hyperlipemia 12/21/2012   Outpatient Encounter Prescriptions as of 08/13/2014  Medication Sig  . amLODipine (NORVASC) 2.5 MG tablet Take 5 mg by mouth daily.   Marland Kitchen aspirin 81 MG tablet Take 81 mg by mouth daily.    . Biotin 1 MG CAPS Take 1 tablet by mouth daily.  . Cholecalciferol (VITAMIN D) 1000 UNITS capsule Take 1,000 Units by mouth daily.  . Cyanocobalamin (B-12 PO) Take by mouth daily.  Marland Kitchen estradiol-norethindrone (MIMVEY) 1-0.5 MG per tablet Take 1 tablet by mouth daily.  Marland Kitchen imipramine (TOFRANIL) 50 MG tablet TAKE 2 TABLETS BY MOUTH AT BEDTIME  . Multiple Vitamin (MULTIVITAMIN) tablet Take 1 tablet by mouth daily.  . naproxen sodium (ANAPROX) 220 MG tablet Take 220 mg by mouth daily.  . Omega-3 Fatty Acids (FISH OIL PO) Take 1 capsule by mouth daily.    . [DISCONTINUED] gabapentin (NEURONTIN) 100 MG capsule Take 1 capsule (100 mg total) by mouth 3 (three) times daily.  . [DISCONTINUED] valACYclovir (VALTREX) 500 MG tablet Take 500 mg by mouth daily as needed.  . [DISCONTINUED] azithromycin (ZITHROMAX) 250 MG tablet Two tablets on 1st day and one tablet on day 2-5.  . [DISCONTINUED] FINACEA 15 % cream Apply 1 application topically 2 (two) times daily.  . [DISCONTINUED] minocycline (MINOCIN,DYNACIN) 100 MG capsule Take 1 capsule by mouth daily.    Review of Systems  Constitutional: Positive for fever (unknown) and chills.  HENT: Positive for congestion and ear pain. Negative for postnasal drip,  sinus pressure and sore throat.   Eyes: Negative.   Respiratory: Positive for cough and chest tightness.   Cardiovascular: Negative.   Gastrointestinal: Negative.   Endocrine: Negative.   Genitourinary: Negative.   Musculoskeletal: Positive for myalgias (body aches).  Skin: Negative.   Allergic/Immunologic: Negative.   Neurological: Positive for headaches.  Hematological: Negative.   Psychiatric/Behavioral: Negative.        Objective:   Physical Exam  Constitutional: She is oriented to person, place, and time. She appears well-developed and well-nourished. No distress.  HENT:  Head: Normocephalic and atraumatic.  Right Ear: External ear normal.  Left Ear: External ear normal.  Mouth/Throat: No oropharyngeal exudate.  Minimal nasal congestion. Slight redness posterior throat  Eyes: Conjunctivae and EOM are normal. Pupils are equal, round, and reactive to light. Right eye exhibits no discharge. Left eye exhibits no discharge. No scleral icterus.  Neck: Normal range of motion. Neck supple. No thyromegaly present.  No anterior cervical nodes  Cardiovascular: Normal rate, regular rhythm and normal heart sounds.   No murmur heard. The heart has a regular rate and rhythm at 60/m  Pulmonary/Chest: Effort normal. No respiratory distress. She has wheezes. She has no rales. She exhibits no tenderness.  Normal breath sounds except minimal wheeze right base posteriorly and inferiorly  Abdominal: Soft. Bowel sounds are normal. She exhibits no mass. There is tenderness (slight lower abdominal tenderness). There is no rebound and no guarding.  Musculoskeletal: Normal range  of motion. She exhibits no edema.  Lymphadenopathy:    She has no cervical adenopathy.  Neurological: She is alert and oriented to person, place, and time.  Skin: Skin is warm and dry. No rash noted.  Psychiatric: She has a normal mood and affect. Her behavior is normal. Judgment and thought content normal.  Nursing note  and vitals reviewed.  BP 131/74 mmHg  Pulse 92  Temp(Src) 96.8 F (36 C) (Oral)  Ht 5\' 2"  (1.575 m)  Wt 140 lb (63.504 kg)  BMI 25.60 kg/m2  The rapid flu test was negative for both influenza A and B. The patient will be informed of this before she leaves office today.       Assessment & Plan:  1. Body aches - POCT Influenza A/B  2. Acute bronchitis, unspecified organism - azithromycin (ZITHROMAX) 250 MG tablet; 2 pills the first day then one daily for infection until complete it  Dispense: 6 tablet; Refill: 0  3. Viral syndrome - azithromycin (ZITHROMAX) 250 MG tablet; 2 pills the first day then one daily for infection until complete it  Dispense: 6 tablet; Refill: 0  Patient Instructions  Drink plenty of fluids Take Tylenol or ibuprofen as needed for aches pains and fever Take Mucinex, maximum strength, blue and white in color, over-the-counter, 1 twice daily with a large glass of water for cough and congestion Use saline nose spray for head congestion Take antibiotic as directed   Arrie Senate MD

## 2014-09-14 ENCOUNTER — Telehealth: Payer: Self-pay | Admitting: Nurse Practitioner

## 2014-09-14 ENCOUNTER — Other Ambulatory Visit (INDEPENDENT_AMBULATORY_CARE_PROVIDER_SITE_OTHER): Payer: Medicare Other

## 2014-09-14 ENCOUNTER — Encounter (INDEPENDENT_AMBULATORY_CARE_PROVIDER_SITE_OTHER): Payer: Self-pay

## 2014-09-14 DIAGNOSIS — D649 Anemia, unspecified: Secondary | ICD-10-CM

## 2014-09-14 LAB — POCT HEMOGLOBIN: HEMOGLOBIN: 14.3 g/dL (ref 12.2–16.2)

## 2014-09-14 NOTE — Telephone Encounter (Signed)
Please make sure that she does a repeat fecal occult blood test and confirm her last colonoscopy.

## 2014-09-14 NOTE — Progress Notes (Signed)
Labs only

## 2014-09-14 NOTE — Telephone Encounter (Signed)
Patient was told when she attempted to donate blood yesterday that she could not because her hemoglobin was 9.6.  I advised patient that an anemia panel is recommended to confirm HBG and to determine severity and type of anemia.  She is coming in today for anemia panel and in house HBG.

## 2014-10-03 ENCOUNTER — Telehealth: Payer: Self-pay | Admitting: Family Medicine

## 2014-10-03 DIAGNOSIS — M549 Dorsalgia, unspecified: Secondary | ICD-10-CM

## 2014-10-04 NOTE — Telephone Encounter (Signed)
Please place an order for a clean catch midstream urinalysis due to back pain

## 2014-10-04 NOTE — Telephone Encounter (Signed)
Patient c/o back pain and wants to come by to leave urine sample.  Should be addressed and OK'd by her PCP - will forward.

## 2014-10-19 ENCOUNTER — Other Ambulatory Visit: Payer: Self-pay | Admitting: Nurse Practitioner

## 2014-11-20 ENCOUNTER — Other Ambulatory Visit: Payer: Self-pay | Admitting: Nurse Practitioner

## 2014-11-20 NOTE — Telephone Encounter (Signed)
Last seen 08/13/14 DWM

## 2014-11-29 ENCOUNTER — Ambulatory Visit (INDEPENDENT_AMBULATORY_CARE_PROVIDER_SITE_OTHER): Payer: Medicare Other | Admitting: Family

## 2014-11-29 ENCOUNTER — Encounter: Payer: Self-pay | Admitting: Family

## 2014-11-29 ENCOUNTER — Telehealth: Payer: Self-pay | Admitting: Nurse Practitioner

## 2014-11-29 VITALS — BP 130/71 | HR 80 | Temp 97.2°F | Ht 62.0 in | Wt 138.0 lb

## 2014-11-29 DIAGNOSIS — R300 Dysuria: Secondary | ICD-10-CM | POA: Diagnosis not present

## 2014-11-29 DIAGNOSIS — N390 Urinary tract infection, site not specified: Secondary | ICD-10-CM | POA: Diagnosis not present

## 2014-11-29 LAB — POCT UA - MICROSCOPIC ONLY
BACTERIA, U MICROSCOPIC: NEGATIVE
CRYSTALS, UR, HPF, POC: NEGATIVE
Casts, Ur, LPF, POC: NEGATIVE
RBC, URINE, MICROSCOPIC: NEGATIVE
Yeast, UA: NEGATIVE

## 2014-11-29 LAB — POCT URINALYSIS DIPSTICK
Bilirubin, UA: NEGATIVE
Blood, UA: NEGATIVE
Glucose, UA: NEGATIVE
KETONES UA: NEGATIVE
Nitrite, UA: NEGATIVE
PH UA: 5
PROTEIN UA: NEGATIVE
Spec Grav, UA: 1.01
Urobilinogen, UA: NEGATIVE

## 2014-11-29 MED ORDER — SULFAMETHOXAZOLE-TRIMETHOPRIM 800-160 MG PO TABS: 1.0000 | ORAL_TABLET | Freq: Two times a day (BID) | ORAL | Status: DC

## 2014-11-29 NOTE — Telephone Encounter (Signed)
She is having lower abdominal pain and foul odor to urine. Appointment given for 2:25 today

## 2014-11-29 NOTE — Progress Notes (Signed)
   Subjective:    Patient ID: Wendy Palmer, female    DOB: 1951-01-29, 64 y.o.   MRN: [id number]  Dysuria  This is a new problem. The current episode started 1 to 4 weeks ago. The problem occurs intermittently. The problem has been gradually worsening. The quality of the pain is described as burning. The pain is at a severity of 4/10. The pain is mild. There has been no fever. Associated symptoms include a discharge, flank pain, frequency and nausea. Pertinent negatives include no hematuria, hesitancy or urgency. She has tried increased fluids and NSAIDs for the symptoms. The treatment provided mild relief.      Review of Systems  Constitutional: Negative.   HENT: Negative.   Eyes: Negative.   Respiratory: Negative.  Negative for shortness of breath.   Cardiovascular: Negative.  Negative for palpitations.  Gastrointestinal: Positive for nausea.  Endocrine: Negative.   Genitourinary: Positive for dysuria, frequency and flank pain. Negative for hesitancy, urgency and hematuria.  Musculoskeletal: Negative.   Neurological: Negative.  Negative for headaches.  Hematological: Negative.   Psychiatric/Behavioral: Negative.   All other systems reviewed and are negative.      Objective:   Physical Exam  Constitutional: She is oriented to person, place, and time. She appears well-developed and well-nourished. No distress.  HENT:  Head: Normocephalic and atraumatic.  Right Ear: External ear normal.  Left Ear: External ear normal.  Nose: Nose normal.  Mouth/Throat: Oropharynx is clear and moist.  Eyes: Pupils are equal, round, and reactive to light.  Neck: Normal range of motion. Neck supple. No thyromegaly present.  Cardiovascular: Normal rate, regular rhythm, normal heart sounds and intact distal pulses.   No murmur heard. Pulmonary/Chest: Effort normal and breath sounds normal. No respiratory distress. She has no wheezes.  Abdominal: Soft. Bowel sounds are normal. She exhibits no  distension. There is no tenderness.  Musculoskeletal: Normal range of motion. She exhibits no edema or tenderness.  Negative for CVA tenderness   Neurological: She is alert and oriented to person, place, and time. She has normal reflexes. No cranial nerve deficit.  Skin: Skin is warm and dry.  Psychiatric: She has a normal mood and affect. Her behavior is normal. Judgment and thought content normal.  Vitals reviewed.     BP 130/71 mmHg  Pulse 80  Temp(Src) 97.2 F (36.2 C) (Oral)  Ht 5\' 2"  (1.575 m)  Wt 138 lb (62.596 kg)  BMI 25.23 kg/m2     Assessment & Plan:  1. Dysuria - POCT UA - Microscopic Only - POCT urinalysis dipstick  2. Urinary tract infection without hematuria, site unspecified Force fluids AZO over the counter X2 days RTO prn - sulfamethoxazole-trimethoprim (BACTRIM DS,SEPTRA DS) 800-160 MG per tablet; Take 1 tablet by mouth 2 (two) times daily.  Dispense: 10 tablet; Refill: 0  Evelina Dun, FNP

## 2014-11-29 NOTE — Patient Instructions (Signed)

## 2014-11-29 NOTE — Telephone Encounter (Signed)
rc

## 2014-12-03 ENCOUNTER — Encounter: Payer: Self-pay | Admitting: Pharmacist

## 2014-12-03 ENCOUNTER — Ambulatory Visit (INDEPENDENT_AMBULATORY_CARE_PROVIDER_SITE_OTHER): Payer: Medicare Other | Admitting: Pharmacist

## 2014-12-03 VITALS — BP 140/80

## 2014-12-03 DIAGNOSIS — I10 Essential (primary) hypertension: Secondary | ICD-10-CM

## 2014-12-03 DIAGNOSIS — R300 Dysuria: Secondary | ICD-10-CM | POA: Diagnosis not present

## 2014-12-03 DIAGNOSIS — Z79899 Other long term (current) drug therapy: Secondary | ICD-10-CM | POA: Diagnosis not present

## 2014-12-03 MED ORDER — ESTRADIOL-NORETHINDRONE ACET 1-0.5 MG PO TABS: 1.0000 | ORAL_TABLET | Freq: Every day | ORAL | Status: DC

## 2014-12-03 NOTE — Progress Notes (Signed)
CC:  Medication Managment  HPI: Patient has several medication related questions today.   She is taking mimvey - hormone replacement.  I was approved on her insurance but is about $25 per month.  She was told there is a cheaper option and wonders if this would abe appropriate for her.    She also start SMZ-TMP about 4 days ago for UTI.  She states that abdominal pain is better but is still there.  She has 3 more doses of SMZ-TMP.  Her husband died of pancreatic cancer and she is always concerned with any abdominal pain.   I have seen patient in past regarding HTN.  She saw cardiologist 07/2014 and amlodipine was increased after EET showed increased BP with exercise.  However she is not taking 5mg  daily - only 2.5mg .  She states that sometimes higher doses make her feel "spacey"  Current Outpatient Prescriptions on File Prior to Visit  Medication Sig Dispense Refill  . aspirin 81 MG tablet Take 81 mg by mouth daily.        Marland Kitchen estradiol-norethindrone (MIMVEY) 1-0.5 MG per tablet Take 1 tablet by mouth daily.  28 tablet  5  . FINACEA 15 % cream Apply 1 application topically 2 (two) times daily.      Marland Kitchen imipramine (TOFRANIL) 50 MG tablet TAKE 2 TABLETS BY MOUTH AT BEDTIME  60 tablet  11  . minocycline (MINOCIN,DYNACIN) 100 MG capsule Take 1 capsule by mouth daily.      . Omega-3 Fatty Acids (FISH OIL PO) Take 1 capsule by mouth daily.        . Biotin 1 MG CAPS Take 1 tablet by mouth daily.      . Cholecalciferol (VITAMIN D) 1000 UNITS capsule Take 1,000 Units by mouth daily.      . Cyanocobalamin (B-12 PO) Take by mouth daily.       Amlodipine 2.5mg  Take 1 tablet daily    . Multiple Vitamin (MULTIVITAMIN) tablet Take 1 tablet by mouth daily.      . naproxen sodium (ANAPROX) 220 MG tablet Take 220 mg by mouth daily.       No current facility-administered medications on file prior to visit.   Filed Vitals:   12/03/14 1417  BP: 140/80    Assessment: HTN - slightly elevated today Abdominal  pain - possibly related to UTI since has improved with ABX treatment.  Postmenopausal hormone therapy  Plan: Recommend she take amlopidine 5mg  as directed by cardiologost Continue to monitor BP (goal is less than 140 /90) Continue SMZ-TMP until all are gone.  If she continues to have abdominal pain she is to RTC for further evaluation Changed Mimvey to generic Activella 1 tablet daily Gave urinalysis cup to bring in sample is continues to have dysuria or abdominal pain.  Cherre Robins, PharmD, CPP    Cherre Robins, PharmD, CPP

## 2014-12-14 ENCOUNTER — Other Ambulatory Visit: Payer: Medicare Other

## 2014-12-14 DIAGNOSIS — R300 Dysuria: Secondary | ICD-10-CM

## 2014-12-16 LAB — URINE CULTURE

## 2014-12-17 ENCOUNTER — Telehealth: Payer: Self-pay | Admitting: Pharmacist

## 2014-12-17 MED ORDER — CIPROFLOXACIN HCL 500 MG PO TABS: 500.0000 mg | ORAL_TABLET | Freq: Two times a day (BID) | ORAL | Status: DC

## 2014-12-17 NOTE — Telephone Encounter (Signed)
Results from urine culture and sensitivity received this am.  Showed continued UTI - e Coli with sensitivity to cipro.  Cipro sent to pharmacy for 7 days

## 2015-02-13 ENCOUNTER — Ambulatory Visit: Admit: 2015-02-13 | Payer: Self-pay | Admitting: Specialist

## 2015-02-13 SURGERY — LUMBAR LAMINECTOMY/DECOMPRESSION MICRODISCECTOMY 1 LEVEL
Anesthesia: General | Laterality: Right

## 2015-02-19 ENCOUNTER — Other Ambulatory Visit: Payer: Self-pay | Admitting: Family Medicine

## 2015-03-19 ENCOUNTER — Telehealth: Payer: Self-pay

## 2015-03-19 ENCOUNTER — Telehealth: Payer: Self-pay | Admitting: Family Medicine

## 2015-03-19 NOTE — Telephone Encounter (Signed)
x

## 2015-03-20 NOTE — Telephone Encounter (Signed)
Printed and gave to Debbi 

## 2015-03-21 ENCOUNTER — Other Ambulatory Visit: Payer: Self-pay | Admitting: Pharmacist

## 2015-03-21 ENCOUNTER — Other Ambulatory Visit (INDEPENDENT_AMBULATORY_CARE_PROVIDER_SITE_OTHER): Payer: Medicare Other

## 2015-03-21 ENCOUNTER — Telehealth: Payer: Self-pay | Admitting: Pharmacist

## 2015-03-21 DIAGNOSIS — R300 Dysuria: Secondary | ICD-10-CM | POA: Diagnosis not present

## 2015-03-21 LAB — POCT URINALYSIS DIPSTICK
Bilirubin, UA: NEGATIVE
Blood, UA: NEGATIVE
Glucose, UA: NEGATIVE
KETONES UA: NEGATIVE
Leukocytes, UA: NEGATIVE
NITRITE UA: NEGATIVE
PH UA: 6
Protein, UA: NEGATIVE
Spec Grav, UA: 1.01
Urobilinogen, UA: NEGATIVE

## 2015-03-21 LAB — POCT UA - MICROSCOPIC ONLY
BACTERIA, U MICROSCOPIC: NEGATIVE
Casts, Ur, LPF, POC: NEGATIVE
Crystals, Ur, HPF, POC: NEGATIVE
RBC, URINE, MICROSCOPIC: NEGATIVE
WBC, UR, HPF, POC: NEGATIVE
YEAST UA: NEGATIVE

## 2015-03-21 NOTE — Telephone Encounter (Signed)
Patient is c/o about pain in back and feels what this is possibly UTI.  Wells for patient to come by and leave urine sample.  Patient called and order placed.

## 2015-03-28 ENCOUNTER — Telehealth: Payer: Self-pay | Admitting: Family Medicine

## 2015-03-28 NOTE — Telephone Encounter (Signed)
Lm to call back

## 2015-04-01 ENCOUNTER — Telehealth: Payer: Self-pay | Admitting: Family Medicine

## 2015-04-01 NOTE — Telephone Encounter (Signed)
Patient called and mimvee is no longer covered by insurance.  Had sent in Rx for activella generic.  Checked with [facility] and they state that mimvey is a generic for activella which it is but might not be the generic that her insurnace prefers.  I instructed patient to try to decreased Mimvey to 1 tablet every other day for 1 month and then to see if she can stop.  If hot flashed or mood swings begin then will change back.

## 2015-04-03 ENCOUNTER — Encounter: Payer: Self-pay | Admitting: Family Medicine

## 2015-04-03 ENCOUNTER — Ambulatory Visit (INDEPENDENT_AMBULATORY_CARE_PROVIDER_SITE_OTHER): Payer: Medicare Other

## 2015-04-03 ENCOUNTER — Ambulatory Visit (INDEPENDENT_AMBULATORY_CARE_PROVIDER_SITE_OTHER): Payer: Medicare Other | Admitting: Family Medicine

## 2015-04-03 ENCOUNTER — Other Ambulatory Visit: Payer: Self-pay | Admitting: *Deleted

## 2015-04-03 VITALS — BP 146/73 | HR 90 | Temp 97.1°F | Ht 62.0 in | Wt 134.0 lb

## 2015-04-03 DIAGNOSIS — R9389 Abnormal findings on diagnostic imaging of other specified body structures: Secondary | ICD-10-CM

## 2015-04-03 DIAGNOSIS — M549 Dorsalgia, unspecified: Secondary | ICD-10-CM | POA: Diagnosis not present

## 2015-04-03 DIAGNOSIS — E785 Hyperlipidemia, unspecified: Secondary | ICD-10-CM

## 2015-04-03 DIAGNOSIS — I10 Essential (primary) hypertension: Secondary | ICD-10-CM

## 2015-04-03 DIAGNOSIS — K5901 Slow transit constipation: Secondary | ICD-10-CM | POA: Diagnosis not present

## 2015-04-03 LAB — POCT UA - MICROSCOPIC ONLY
Bacteria, U Microscopic: NEGATIVE
Casts, Ur, LPF, POC: NEGATIVE
Crystals, Ur, HPF, POC: NEGATIVE
MUCUS UA: NEGATIVE
RBC, URINE, MICROSCOPIC: NEGATIVE
Yeast, UA: NEGATIVE

## 2015-04-03 LAB — POCT URINALYSIS DIPSTICK
Bilirubin, UA: NEGATIVE
Blood, UA: NEGATIVE
GLUCOSE UA: NEGATIVE
Ketones, UA: NEGATIVE
Leukocytes, UA: NEGATIVE
NITRITE UA: NEGATIVE
PROTEIN UA: NEGATIVE
SPEC GRAV UA: 1.01
Urobilinogen, UA: NEGATIVE
pH, UA: 6

## 2015-04-03 NOTE — Progress Notes (Signed)
Subjective:    Patient ID: Wendy Palmer, female    DOB: [birth year], 64 y.o.   MRN: [id number]  HPI  Patient here for back/flank pain beginning about 2 weeks ago, pain seems to worsen as the day progresses. She says her back just feels tired. She is also had some constipation and she has been recommended to take MiraLAX for this. She was doing a lot of walking but has not been doing as much walking recently because of the heat. She indicates she is drinking plenty of fluids. She has not had any injury that she is aware of in the past 2-3 weeks.        Patient Active Problem List   Diagnosis Date Noted  . Dyspnea 06/08/2014  . Hot flashes, menopausal 03/13/2014  . Rosacea 12/21/2012  . Hypertension 12/21/2012  . Hyperlipemia 12/21/2012   Outpatient Encounter Prescriptions as of 04/03/2015  Medication Sig  . amLODipine (NORVASC) 2.5 MG tablet Take 5 mg by mouth daily.   Marland Kitchen aspirin 81 MG tablet Take 81 mg by mouth daily.    . Cholecalciferol (VITAMIN D) 1000 UNITS capsule Take 1,000 Units by mouth daily.  . Cyanocobalamin (B-12 PO) Take by mouth daily.  Marland Kitchen estradiol-norethindrone (ACTIVELLA) 1-0.5 MG per tablet Take 1 tablet by mouth daily.  Marland Kitchen imipramine (TOFRANIL) 50 MG tablet TAKE 2 TABLETS BY MOUTH AT BEDTIME  . Multiple Vitamin (MULTIVITAMIN) tablet Take 1 tablet by mouth daily.  . naproxen sodium (ANAPROX) 220 MG tablet Take 220 mg by mouth daily.  . Omega-3 Fatty Acids (FISH OIL PO) Take 1 capsule by mouth daily.     No facility-administered encounter medications on file as of 04/03/2015.       Review of Systems  Constitutional: Negative.   HENT: Negative.   Eyes: Negative.   Respiratory: Negative.   Cardiovascular: Negative.   Gastrointestinal: Negative.   Endocrine: Negative.   Genitourinary: Negative.   Musculoskeletal: Positive for back pain (low back/flank pain).  Skin: Negative.   Allergic/Immunologic: Negative.   Neurological: Negative.   Hematological:  Negative.   Psychiatric/Behavioral: Negative.        Objective:   Physical Exam  Constitutional: She is oriented to person, place, and time. She appears well-developed and well-nourished. No distress.  The patient is alert and in good spirits  HENT:  Head: Normocephalic.  Eyes: Conjunctivae and EOM are normal. Pupils are equal, round, and reactive to light. Right eye exhibits no discharge. Left eye exhibits no discharge. No scleral icterus.  Neck: Normal range of motion. Neck supple. No thyromegaly present.  Cardiovascular: Normal rate, regular rhythm and normal heart sounds.   No murmur heard. At 72/m  Pulmonary/Chest: Effort normal and breath sounds normal. She has no wheezes. She has no rales.  Abdominal: Soft. Bowel sounds are normal. She exhibits no mass. There is no tenderness. There is no rebound and no guarding.  No abdominal tenderness or masses or organ enlargement  Musculoskeletal: Normal range of motion. She exhibits no edema.  Lymphadenopathy:    She has no cervical adenopathy.  Neurological: She is alert and oriented to person, place, and time.  Skin: Skin is warm and dry. No rash noted.  Psychiatric: She has a normal mood and affect. Her behavior is normal. Judgment and thought content normal.  Nursing note and vitals reviewed.   BP 146/73 mmHg  Pulse 90  Temp(Src) 97.1 F (36.2 C) (Oral)  Ht 5\' 2"  (1.575 m)  Wt 134 lb (60.782 kg)  BMI 24.50 kg/m2  [facility] reading (PRIMARY) by  Dr.Moore-thoracic spine minimal degenerative changes                                  Results for orders placed or performed in visit on 04/03/15  POCT urinalysis dipstick  Result Value Ref Range   Color, UA yellow    Clarity, UA clear    Glucose, UA neg    Bilirubin, UA neg    Ketones, UA neg    Spec Grav, UA 1.010    Blood, UA neg    pH, UA 6.0    Protein, UA neg    Urobilinogen, UA negative    Nitrite, UA neg    Leukocytes, UA Negative Negative  POCT UA - Microscopic Only    Result Value Ref Range   WBC, Ur, HPF, POC rare    RBC, urine, microscopic neg    Bacteria, U Microscopic neg    Mucus, UA neg    Epithelial cells, urine per micros occ    Crystals, Ur, HPF, POC neg    Casts, Ur, LPF, POC neg    Yeast, UA neg         Assessment & Plan:  1. Essential hypertension -The systolic blood pressure reading is slightly elevated today but the patient should continue with  2. Hyperlipemia -This was not checked today but will be checked at future visits with lab work.  3. Back pain, unspecified location -Resume, take Aleve over-the-counter twice daily for 7-10 days - DG Thoracic Spine 2 View; Future - POCT urinalysis dipstick - POCT UA - Microscopic Only - Urine culture  4. Slow transit constipation -Drink plenty of water, use MiraLAX and or prune juice as needed -Also start probiotic as directed  Patient Instructions  Continue to walk and exercise regularly Drink plenty of water Use the MiraLAX as directed and as needed for constipation or even try some prune juice or prunes A probiotic can also be helpful with abdominal pain and bloating. A good probiotic over-the-counter is align. This can be taken once daily. Also for a short period of time take Aleve over-the-counter 1 twice daily after breakfast and supper for 7-10 days and see if this will help relieve some of the back discomfort   Arrie Senate MD   .

## 2015-04-03 NOTE — Patient Instructions (Addendum)
Continue to walk and exercise regularly Drink plenty of water Use the MiraLAX as directed and as needed for constipation or even try some prune juice or prunes A probiotic can also be helpful with abdominal pain and bloating. A good probiotic over-the-counter is align. This can be taken once daily. Also for a short period of time take Aleve over-the-counter 1 twice daily after breakfast and supper for 7-10 days and see if this will help relieve some of the back discomfort

## 2015-04-05 ENCOUNTER — Ambulatory Visit (INDEPENDENT_AMBULATORY_CARE_PROVIDER_SITE_OTHER): Payer: Medicare Other

## 2015-04-05 DIAGNOSIS — R9389 Abnormal findings on diagnostic imaging of other specified body structures: Secondary | ICD-10-CM

## 2015-04-05 DIAGNOSIS — R938 Abnormal findings on diagnostic imaging of other specified body structures: Secondary | ICD-10-CM

## 2015-04-25 ENCOUNTER — Other Ambulatory Visit: Payer: Self-pay | Admitting: Family Medicine

## 2015-05-01 ENCOUNTER — Telehealth: Payer: Self-pay | Admitting: Family Medicine

## 2015-05-27 ENCOUNTER — Other Ambulatory Visit: Payer: Self-pay | Admitting: Family

## 2015-06-05 ENCOUNTER — Encounter: Payer: Medicare Other | Admitting: Family Medicine

## 2015-06-06 ENCOUNTER — Ambulatory Visit (INDEPENDENT_AMBULATORY_CARE_PROVIDER_SITE_OTHER): Payer: Medicare Other

## 2015-06-06 DIAGNOSIS — Z23 Encounter for immunization: Secondary | ICD-10-CM | POA: Diagnosis not present

## 2015-06-20 ENCOUNTER — Encounter: Payer: Medicare Other | Admitting: Family Medicine

## 2015-06-28 ENCOUNTER — Encounter: Payer: Medicare Other | Admitting: Family Medicine

## 2015-07-09 ENCOUNTER — Encounter: Payer: Self-pay | Admitting: Family Medicine

## 2015-07-09 ENCOUNTER — Ambulatory Visit (INDEPENDENT_AMBULATORY_CARE_PROVIDER_SITE_OTHER): Payer: Medicare Other | Admitting: Family Medicine

## 2015-07-09 ENCOUNTER — Encounter (INDEPENDENT_AMBULATORY_CARE_PROVIDER_SITE_OTHER): Payer: Self-pay

## 2015-07-09 VITALS — BP 118/60 | HR 84 | Temp 97.1°F | Ht 62.0 in | Wt 138.0 lb

## 2015-07-09 DIAGNOSIS — R0609 Other forms of dyspnea: Secondary | ICD-10-CM | POA: Diagnosis not present

## 2015-07-09 DIAGNOSIS — Z0000 Encounter for general adult medical examination without abnormal findings: Secondary | ICD-10-CM

## 2015-07-09 DIAGNOSIS — E785 Hyperlipidemia, unspecified: Secondary | ICD-10-CM | POA: Diagnosis not present

## 2015-07-09 DIAGNOSIS — M4120 Other idiopathic scoliosis, site unspecified: Secondary | ICD-10-CM | POA: Diagnosis not present

## 2015-07-09 DIAGNOSIS — I10 Essential (primary) hypertension: Secondary | ICD-10-CM | POA: Diagnosis not present

## 2015-07-09 DIAGNOSIS — Z23 Encounter for immunization: Secondary | ICD-10-CM

## 2015-07-09 MED ORDER — IMIPRAMINE HCL 50 MG PO TABS: 100.0000 mg | ORAL_TABLET | Freq: Every day | ORAL | Status: DC

## 2015-07-09 MED ORDER — AMLODIPINE BESYLATE 2.5 MG PO TABS: ORAL_TABLET | ORAL | Status: DC

## 2015-07-09 NOTE — Progress Notes (Signed)
Subjective:    Patient ID: Wendy Palmer, female    DOB: 1950-11-30, 64 y.o.   MRN: [id number]  HPI Pt here for follow up and management of chronic medical problems which includes hypertension and hyperlipidemia. She is taking medications regularly. The patient is doing well overall. She has her Pap smear mammogram and DEXA scan scheduled with her gynecologist on December 1 of this year. She is up-to-date on her chest x-ray. She will be given an FOBT to return. Her last colonoscopy was in October 2013. She will get lab work done today and will be given a Prevnar vaccine. The patient denies chest pain. She does have some left flank pain. This is not all the time. On reviewing her recent chest x-ray there was a comment about some thoracic lumbar scoliosis. The patient does have some shortness of breath especially with climbing. She saw the cardiologist in the past couple years and had a stress test and this was within normal limits. She does not smoke. We will most likely her offer her the opportunity to see a pulmonary person to further evaluate her breathing. She denies any trouble with swallowing and does have occasional heartburn or indigestion. This does not happen often. She has not seen any black tarry bowel movements but does have occasional bright red blood and this is related to her constipation. There is a positive family history for colon cancer in her mother. The patient is up-to-date on her colonoscopies in the next one is not due until 2018. She plans to get her pelvic exam and Pap smear December 1. She is thinking about getting her mammogram here and we'll schedule this as she leaves today. She is not having any problems passing her water.    Patient Active Problem List   Diagnosis Date Noted  . Dyspnea 06/08/2014  . Hot flashes, menopausal 03/13/2014  . Rosacea 12/21/2012  . Hypertension 12/21/2012  . Hyperlipemia 12/21/2012   Outpatient Encounter Prescriptions as of 07/09/2015    Medication Sig  . amLODipine (NORVASC) 2.5 MG tablet TAKE 1 TABLET (2.5 MG TOTAL) BY MOUTH DAILY.  Marland Kitchen aspirin 81 MG tablet Take 81 mg by mouth daily.    . Cholecalciferol (VITAMIN D) 1000 UNITS capsule Take 1,000 Units by mouth daily.  . Cyanocobalamin (B-12 PO) Take by mouth daily.  Marland Kitchen imipramine (TOFRANIL) 50 MG tablet TAKE 2 TABLETS BY MOUTH AT BEDTIME  . Multiple Vitamin (MULTIVITAMIN) tablet Take 1 tablet by mouth daily.  . naproxen sodium (ANAPROX) 220 MG tablet Take 220 mg by mouth daily.  . Omega-3 Fatty Acids (FISH OIL PO) Take 1 capsule by mouth daily.    . [DISCONTINUED] amLODipine (NORVASC) 2.5 MG tablet Take 5 mg by mouth daily.   . [DISCONTINUED] estradiol-norethindrone (MIMVEY) 1-0.5 MG per tablet Take 1 tablet by mouth daily.   No facility-administered encounter medications on file as of 07/09/2015.      Review of Systems  Constitutional: Negative.   HENT: Negative.   Eyes: Negative.   Respiratory: Negative.   Cardiovascular: Negative.   Gastrointestinal: Positive for constipation.  Endocrine: Negative.   Genitourinary: Negative.   Musculoskeletal: Negative.   Skin: Negative.   Allergic/Immunologic: Negative.   Neurological: Negative.   Hematological: Negative.   Psychiatric/Behavioral: Negative.        Objective:   Physical Exam  Constitutional: She is oriented to person, place, and time. She appears well-developed and well-nourished. No distress.  HENT:  Head: Normocephalic and atraumatic.  Right Ear: External ear  normal.  Left Ear: External ear normal.  Nose: Nose normal.  Mouth/Throat: Oropharynx is clear and moist. No oropharyngeal exudate.  Eyes: Conjunctivae and EOM are normal. Pupils are equal, round, and reactive to light. Right eye exhibits no discharge. Left eye exhibits no discharge. No scleral icterus.  Neck: Normal range of motion. Neck supple. No thyromegaly present.  Cardiovascular: Normal rate, regular rhythm, normal heart sounds and intact  distal pulses.  Exam reveals no gallop and no friction rub.   No murmur heard. At 84/m  Pulmonary/Chest: Effort normal and breath sounds normal. No respiratory distress. She has no wheezes. She has no rales. She exhibits no tenderness.  Clear anteriorly and posteriorly  Abdominal: Soft. Bowel sounds are normal. She exhibits no mass. There is no tenderness. There is no rebound and no guarding.  Some slight left lower quadrant tenderness to palpation. There were no abdominal bruits no spleen or liver enlargement and no masses.  Musculoskeletal: Normal range of motion. She exhibits no edema.  Lymphadenopathy:    She has no cervical adenopathy.  Neurological: She is alert and oriented to person, place, and time. She has normal reflexes. No cranial nerve deficit.  Skin: Skin is warm and dry. No rash noted.  Psychiatric: She has a normal mood and affect. Her behavior is normal. Judgment and thought content normal.  Nursing note and vitals reviewed.  BP 118/60 mmHg  Pulse 84  Temp(Src) 97.1 F (36.2 C) (Oral)  Ht 5' 2"  (1.575 m)  Wt 138 lb (62.596 kg)  BMI 25.23 kg/m2        Assessment & Plan:  1. Essential hypertension -The blood pressure is good today the patient will continue with her current treatment. She is reminded that amlodipine may play some role with her constipation as well as the imipramine. - BMP8+EGFR; Future - CBC with Differential/Platelet; Future - Hepatic function panel; Future - NMR, lipoprofile; Future  2. Hyperlipemia -She should continue with aggressive therapeutic lifestyle changes which include diet and exercise and omega-3 fatty acids pending results of the upcoming lab work. - CBC with Differential/Platelet; Future - NMR, lipoprofile; Future  3. Healthcare maintenance -She is due to get her pelvic exam Pap smear and mammogram and DEXA scan. She was made aware of this. She is also due to return the FOBT. - CBC with Differential/Platelet; Future - Vit D   25 hydroxy (rtn osteoporosis monitoring); Future - Thyroid Panel With TSH; Future  4. Dyspnea on exertion -She has seen the cardiologist and we will get a good pulmonary evaluation to make sure there is no issues with her lungs because of the ongoing shortness of breath especially with exertion. - Ambulatory referral to Pulmonology  5. Idiopathic scoliosis -This is in the thoracic lumbar area and this could very well be responsible along with the degenerative changes on a recent thoracic spine film for causing the left flank pain she describes at times.  Meds ordered this encounter  Medications  . imipramine (TOFRANIL) 50 MG tablet    Sig: Take 2 tablets (100 mg total) by mouth at bedtime.    Dispense:  180 tablet    Refill:  3  . amLODipine (NORVASC) 2.5 MG tablet    Sig: TAKE 1 TABLET (2.5 MG TOTAL) BY MOUTH DAILY.    Dispense:  90 tablet    Refill:  3   Patient Instructions  Medicare Annual Wellness Visit  [facility] and the medical providers at [facility] strive to bring you the best medical care.  In doing so we not only want to address your current medical conditions and concerns but also to detect new conditions early and prevent illness, disease and health-related problems.    Medicare offers a yearly Wellness Visit which allows our clinical staff to assess your need for preventative services including immunizations, lifestyle education, counseling to decrease risk of preventable diseases and screening for fall risk and other medical concerns.    This visit is provided free of charge (no copay) for all Medicare recipients. The clinical pharmacists at [facility] have begun to conduct these Wellness Visits which will also include a thorough review of all your medications.    As you primary medical provider recommend that you make an appointment for your Annual Wellness Visit if you have not done so already this  year.  You may set up this appointment before you leave today or you may call back ([phone]) and schedule an appointment.  Please make sure when you call that you mention that you are scheduling your Annual Wellness Visit with the clinical pharmacist so that the appointment may be made for the proper length of time.     Continue current medications. Continue good therapeutic lifestyle changes which include good diet and exercise. Fall precautions discussed with patient. If an FOBT was given today- please return it to our front desk. If you are over 96 years old - you may need Prevnar 27 or the adult Pneumonia vaccine.  **Flu shots are available--- please call and schedule a FLU-CLINIC appointment**  After your visit with [place] today you will receive a survey in the mail or online from [employer] regarding your care with [place]. Please take a moment to fill this out. Your feedback is very important to [place] as you can help [place] better understand your patient needs as well as improve your experience and satisfaction. WE CARE ABOUT YOU!!!   The patient should return to clinic fasting for her lab work. She should make sure that when she gets her mammogram DEXA scan and Pap and pelvic exam she has her gynecologist send [place] a copy of that report She should take the MiraLAX more regularly and continue to drink plenty of water and fluids She should continue walking regularly She should watch her milk cheese ice cream and dairy products as these have a tendency to give you a lot of gas and bloating symptoms She should return the FOBT card If she continues to have shortness of breath and since she has seen the cardiologist about this we may make a one-time referral to a pulmonologist Most likely the amlodipine and imipramine are playing a role with her constipation The Prevnar vaccine she receives may make her arm sore The recent chest x-ray was reviewed and he did have some scoliosis and this could be playing a  role with some of your left flank pain. Continue to walk and exercise regularly   Arrie Senate MD

## 2015-07-09 NOTE — Patient Instructions (Addendum)
Medicare Annual Wellness Visit  [facility] and the medical providers at [facility] strive to bring you the best medical care.  In doing so we not only want to address your current medical conditions and concerns but also to detect new conditions early and prevent illness, disease and health-related problems.    Medicare offers a yearly Wellness Visit which allows our clinical staff to assess your need for preventative services including immunizations, lifestyle education, counseling to decrease risk of preventable diseases and screening for fall risk and other medical concerns.    This visit is provided free of charge (no copay) for all Medicare recipients. The clinical pharmacists at [facility] have begun to conduct these Wellness Visits which will also include a thorough review of all your medications.    As you primary medical provider recommend that you make an appointment for your Annual Wellness Visit if you have not done so already this year.  You may set up this appointment before you leave today or you may call back ([phone]) and schedule an appointment.  Please make sure when you call that you mention that you are scheduling your Annual Wellness Visit with the clinical pharmacist so that the appointment may be made for the proper length of time.     Continue current medications. Continue good therapeutic lifestyle changes which include good diet and exercise. Fall precautions discussed with patient. If an FOBT was given today- please return it to our front desk. If you are over 33 years old - you may need Prevnar 66 or the adult Pneumonia vaccine.  **Flu shots are available--- please call and schedule a FLU-CLINIC appointment**  After your visit with [place] today you will receive a survey in the mail or online from [employer] regarding your care with [place]. Please take a moment to fill this out. Your feedback is very  important to [place] as you can help [place] better understand your patient needs as well as improve your experience and satisfaction. WE CARE ABOUT YOU!!!   The patient should return to clinic fasting for her lab work. She should make sure that when she gets her mammogram DEXA scan and Pap and pelvic exam she has her gynecologist send [place] a copy of that report She should take the MiraLAX more regularly and continue to drink plenty of water and fluids She should continue walking regularly She should watch her milk cheese ice cream and dairy products as these have a tendency to give you a lot of gas and bloating symptoms She should return the FOBT card If she continues to have shortness of breath and since she has seen the cardiologist about this we may make a one-time referral to a pulmonologist Most likely the amlodipine and imipramine are playing a role with her constipation The Prevnar vaccine she receives may make her arm sore The recent chest x-ray was reviewed and he did have some scoliosis and this could be playing a role with some of your left flank pain. Continue to walk and exercise regularly

## 2015-07-19 ENCOUNTER — Other Ambulatory Visit (INDEPENDENT_AMBULATORY_CARE_PROVIDER_SITE_OTHER): Payer: Medicare Other

## 2015-07-19 DIAGNOSIS — E785 Hyperlipidemia, unspecified: Secondary | ICD-10-CM

## 2015-07-19 DIAGNOSIS — I10 Essential (primary) hypertension: Secondary | ICD-10-CM

## 2015-07-19 DIAGNOSIS — Z0000 Encounter for general adult medical examination without abnormal findings: Secondary | ICD-10-CM

## 2015-07-19 NOTE — Progress Notes (Signed)
Lab only 

## 2015-07-20 LAB — BMP8+EGFR
BUN / CREAT RATIO: 24 (ref 11–26)
BUN: 18 mg/dL (ref 8–27)
CO2: 23 mmol/L (ref 18–29)
Calcium: 10.1 mg/dL (ref 8.7–10.3)
Chloride: 99 mmol/L (ref 97–106)
Creatinine, Ser: 0.74 mg/dL (ref 0.57–1.00)
GFR calc Af Amer: 98 mL/min/{1.73_m2} (ref >59)
GFR, EST NON AFRICAN AMERICAN: 85 mL/min/{1.73_m2} (ref >59)
Glucose: 99 mg/dL (ref 65–99)
POTASSIUM: 5.1 mmol/L (ref 3.5–5.2)
SODIUM: 138 mmol/L (ref 136–144)

## 2015-07-20 LAB — HEPATIC FUNCTION PANEL
ALT: 32 IU/L (ref 0–32)
AST: 31 IU/L (ref 0–40)
Albumin: 3.9 g/dL (ref 3.6–4.8)
Alkaline Phosphatase: 119 IU/L — ABNORMAL HIGH (ref 39–117)
Bilirubin Total: 0.3 mg/dL (ref 0.0–1.2)
Bilirubin, Direct: 0.06 mg/dL (ref 0.00–0.40)
Total Protein: 6.4 g/dL (ref 6.0–8.5)

## 2015-07-20 LAB — CBC WITH DIFFERENTIAL/PLATELET
BASOS: 1 %
Basophils Absolute: 0 10*3/uL (ref 0.0–0.2)
EOS (ABSOLUTE): 0.3 10*3/uL (ref 0.0–0.4)
Eos: 5 %
Hematocrit: 39.2 % (ref 34.0–46.6)
Hemoglobin: 13 g/dL (ref 11.1–15.9)
IMMATURE GRANULOCYTES: 0 %
Immature Grans (Abs): 0 10*3/uL (ref 0.0–0.1)
LYMPHS ABS: 1.8 10*3/uL (ref 0.7–3.1)
Lymphs: 30 %
MCH: 29 pg (ref 26.6–33.0)
MCHC: 33.2 g/dL (ref 31.5–35.7)
MCV: 88 fL (ref 79–97)
MONOS ABS: 0.3 10*3/uL (ref 0.1–0.9)
Monocytes: 6 %
NEUTROS ABS: 3.7 10*3/uL (ref 1.4–7.0)
NEUTROS PCT: 58 %
Platelets: 184 10*3/uL (ref 150–379)
RBC: 4.48 x10E6/uL (ref 3.77–5.28)
RDW: 14.8 % (ref 12.3–15.4)
WBC: 6.2 10*3/uL (ref 3.4–10.8)

## 2015-07-20 LAB — THYROID PANEL WITH TSH
FREE THYROXINE INDEX: 1.7 (ref 1.2–4.9)
T3 UPTAKE RATIO: 31 % (ref 24–39)
T4, Total: 5.6 ug/dL (ref 4.5–12.0)
TSH: 2.34 u[IU]/mL (ref 0.450–4.500)

## 2015-07-20 LAB — VITAMIN D 25 HYDROXY (VIT D DEFICIENCY, FRACTURES): Vit D, 25-Hydroxy: 20.2 ng/mL — ABNORMAL LOW (ref 30.0–100.0)

## 2015-07-20 LAB — NMR, LIPOPROFILE
Cholesterol: 182 mg/dL (ref 100–199)
HDL Cholesterol by NMR: 72 mg/dL (ref >39)
HDL Particle Number: 40.4 umol/L (ref >=30.5)
LDL Particle Number: 1002 nmol/L — ABNORMAL HIGH (ref <1000)
LDL SIZE: 21.4 nm (ref >20.5)
LDL-C: 101 mg/dL — ABNORMAL HIGH (ref 0–99)
LP-IR Score: <25 (ref <=45)
SMALL LDL PARTICLE NUMBER: 287 nmol/L (ref <=527)
TRIGLYCERIDES BY NMR: 45 mg/dL (ref 0–149)

## 2015-07-22 NOTE — Addendum Note (Signed)
Addended by: Michaela Corner on: 07/22/2015 02:50 PM   Modules accepted: Orders

## 2015-07-30 ENCOUNTER — Telehealth: Payer: Self-pay | Admitting: Family Medicine

## 2015-07-30 NOTE — Telephone Encounter (Signed)
Spoke with patient.

## 2015-08-12 ENCOUNTER — Telehealth: Payer: Self-pay | Admitting: Family Medicine

## 2015-08-19 ENCOUNTER — Encounter: Payer: Self-pay | Admitting: Internal Medicine

## 2015-08-19 ENCOUNTER — Ambulatory Visit (INDEPENDENT_AMBULATORY_CARE_PROVIDER_SITE_OTHER): Payer: Medicare Other | Admitting: Internal Medicine

## 2015-08-19 VITALS — BP 116/80 | HR 89 | Ht 62.0 in | Wt 135.8 lb

## 2015-08-19 DIAGNOSIS — I10 Essential (primary) hypertension: Secondary | ICD-10-CM

## 2015-08-19 DIAGNOSIS — R0600 Dyspnea, unspecified: Secondary | ICD-10-CM

## 2015-08-19 MED ORDER — TRAMADOL HCL 50 MG PO TABS: ORAL_TABLET | ORAL | Status: DC

## 2015-08-19 MED ORDER — ACETAMINOPHEN-CODEINE #3 300-30 MG PO TABS: ORAL_TABLET | ORAL | Status: DC

## 2015-08-19 MED ORDER — PANTOPRAZOLE SODIUM 40 MG PO TBEC: 40.0000 mg | DELAYED_RELEASE_TABLET | Freq: Every day | ORAL | Status: DC

## 2015-08-19 MED ORDER — FAMOTIDINE 20 MG PO TABS: ORAL_TABLET | ORAL | Status: AC

## 2015-08-19 NOTE — Patient Instructions (Addendum)
For back pain try tylenol # 3 one every 4 hours as needed if heating pad not effective but stop the aleve for now to see if abdominal  pain better   Pantoprazole (protonix) 40 mg   Take  30-60 min before first meal of the day and Pepcid (famotidine)  20 mg one @  bedtime until return to office - this is the best way to tell whether stomach acid is contributing to your problem.    GERD (REFLUX)  is an extremely common cause of respiratory symptoms just like yours , many times with no obvious heartburn at all.    It can be treated with medication, but also with lifestyle changes including elevation of the head of your bed (ideally with 6 inch  bed blocks),  Smoking cessation, avoidance of late meals, excessive alcohol, and avoid fatty foods, chocolate, peppermint, colas, red wine, and acidic juices such as orange juice.  NO MINT OR MENTHOL PRODUCTS SO NO COUGH DROPS  USE SUGARLESS CANDY INSTEAD (Jolley ranchers or Stover's or Life Savers) or even ice chips will also do - the key is to swallow to prevent all throat clearing. NO OIL BASED VITAMINS - use powdered substitutes.   Please see patient coordinator before you leave today  to schedule CPST for 4 weeks, not sooner (ok to cancel if symptoms resolve on above rx)

## 2015-08-19 NOTE — Progress Notes (Signed)
Subjective:    Patient ID: Wendy Palmer, female    DOB: [birth year],    MRN: [id number]  HPI  62 yowf quit smoking 1978 with first pregnancy no problem with cough or sob  including walking 3.5 miles until 2011 then got tired of it and referred to pulmonary clinic 08/19/2015 by Dr Laurance Flatten with doe x fast walk or uphills indolent onset x 2014   08/19/2015 1st [facility] office visit/ Wendy Palmer   Chief Complaint  Patient presents with  . Pulmonary Consult    Referred by Dr. Redge Gainer. Pt c/o SOB for the past 2 yrs, worse for the past 6 months. She states she gets SOB when walking up any incline or stairs.    gradual onset/ slowly progressive doe but only with hills/ steps x 2 fligts   07/06/14 neg gxt except high bp so rec  increase  norvasc  But never tried it  Back pain midline center of back with some rad to neck never with exertion/  better p aleve hs x 3 month esp lying down, heating pad at least once week helps  / epigastric pain x one year, diet related   No obvious   day to day or daytime variabilty or assoc chronic cough or pleuritic / ex cp or chest tightness, subjective wheeze overt sinus or hb symptoms. No unusual exp hx or h/o childhood pna/ asthma or knowledge of premature birth.  Sleeping ok without nocturnal  or early am exacerbation  of respiratory  c/o's or need for noct saba. Also denies any obvious fluctuation of symptoms with weather or environmental changes or other aggravating or alleviating factors except as outlined above   Current Medications, Allergies, Complete Past Medical History, Past Surgical History, Family History, and Social History were reviewed in [employer] record.               Review of Systems  Constitutional: Negative for fever, chills and unexpected weight change.  HENT: Negative for congestion, dental problem, ear pain, nosebleeds, postnasal drip, rhinorrhea, sinus pressure, sneezing, sore throat, trouble swallowing  and voice change.   Eyes: Negative for visual disturbance.  Respiratory: Positive for shortness of breath. Negative for cough and choking.   Cardiovascular: Negative for chest pain and leg swelling.  Gastrointestinal: Positive for abdominal pain. Negative for vomiting and diarrhea.  Genitourinary: Negative for difficulty urinating.  Musculoskeletal: Positive for arthralgias.  Skin: Negative for rash.  Neurological: Negative for tremors, syncope and headaches.  Hematological: Does not bruise/bleed easily.       Objective:   Physical Exam    amb wf chewing mint gum   Wt Readings from Last 3 Encounters:  08/19/15 135 lb 12.8 oz (61.598 kg)  07/09/15 138 lb (62.596 kg)  04/03/15 134 lb (60.782 kg)    Vital signs reviewed    HEENT: nl dentition, turbinates, and oropharynx. Nl external ear canals without cough reflex   NECK :  without JVD/Nodes/TM/ nl carotid upstrokes bilaterally   LUNGS: no acc muscle use,  Nl contour chest which is clear to A and P bilaterally without cough on insp or exp maneuvers   CV:  RRR  no s3 or murmur or increase in P2, no edema   ABD:  soft and nontender with nl inspiratory excursion in the supine position. No bruits or organomegaly, bowel sounds nl  MS:  Nl gait/ ext warm without deformities, calf tenderness, cyanosis or clubbing No obvious joint restrictions   SKIN: warm  and dry without lesions    NEURO:  alert, approp, nl sensorium with  no motor deficits      I personally reviewed images and agree with radiology impression as follows:  CXR:  04/05/15  The abnormality identified on recent thoracic spine radiographs is less prominent on chest radiography and unchanged since 2014, suspect combination of prominent pulmonary vascular marking and minimal atelectasis versus scarring in lingula  Labs reviewed  Lab Results  Component Value Date   WBC 6.2 07/19/2015   HGB 14.3 09/14/2014   HCT 39.2 07/19/2015   MCV 88.2 11/14/2013       Chemistry      Component Value Date/Time   NA 138 07/19/2015 0801   NA 136 03/08/2013 1517   K 5.1 07/19/2015 0801   CL 99 07/19/2015 0801   CO2 23 07/19/2015 0801   BUN 18 07/19/2015 0801   BUN 16 03/08/2013 1517   CREATININE 0.74 07/19/2015 0801   CREATININE 0.64 03/08/2013 1517      Component Value Date/Time   CALCIUM 10.1 07/19/2015 0801   ALKPHOS 119* 07/19/2015 0801   AST 31 07/19/2015 0801   ALT 32 07/19/2015 0801   BILITOT 0.3 07/19/2015 0801   BILITOT <0.2 11/14/2013 1553         Lab Results  Component Value Date   TSH 2.340 07/19/2015           Assessment & Plan:

## 2015-08-20 NOTE — Assessment & Plan Note (Addendum)
08/19/2015  Walked RA x 3 laps @ 185 ft each stopped due to  End of study, very brisk  pace, no sob or desat  Or cp   Symptoms are markedly disproportionate to objective findings and not clear this is a lung problem but pt does appear to have difficult airway management issues. DDX of  difficult airways management all start with A and  include Adherence, Ace Inhibitors, Acid Reflux, Active Sinus Disease, Alpha 1 Antitripsin deficiency, Anxiety masquerading as Airways dz,  ABPA,  allergy(esp in young), Aspiration (esp in elderly), Adverse effects of meds,  Active smokers, A bunch of PE's (a small clot burden can't cause this syndrome unless there is already severe underlying pulm or vascular dz with poor reserve) plus two Bs  = Bronchiectasis and Beta blocker use..and one C= CHF  ? Acid (or non-acid) GERD > always difficult to exclude as up to 75% of pts in some series report no assoc GI/ Heartburn symptoms and she has had overt epigastric pains wax and wane x one year  On prn hs aleve> rec aleve and max (24h)  acid suppression and diet restrictions/ reviewed and instructions given in writing.   ? Anxiety (?related to deconditioning p stopped doing regular aerobic ex) > usually at the bottom of this list of usual suspects but should be much higher on this pt's based on H and P  And lack of obvious cause for doe > needs cpst to sort out.   ? CHF / diastolic dysfunction related to hbp resp to ex (see hbp a/p)    I had an extended discussion with the patient reviewing all relevant studies completed to date and  lasting 35 minutes of a 60 minute visit    Each maintenance medication was reviewed in detail including most importantly the difference between maintenance and prns and under what circumstances the prns are to be triggered using an action plan format that is not reflected in the computer generated alphabetically organized AVS.    Please see instructions for details which were reviewed in writing  and the patient given a copy highlighting the part that I personally wrote and discussed at today's ov.

## 2015-08-20 NOTE — Assessment & Plan Note (Signed)
Ok at rest but note that she was already instructed by cardiology to increase the Norvasc to 5 mg daily. I suggested she at least do this for a full week before we repeat the CPST so we don't end up with the same recommendation and make sure that this actually solves the hypertensive response to exercise.

## 2015-08-27 ENCOUNTER — Encounter: Payer: Self-pay | Admitting: Family Medicine

## 2015-08-27 ENCOUNTER — Ambulatory Visit (HOSPITAL_COMMUNITY)
Admission: RE | Admit: 2015-08-27 | Discharge: 2015-08-27 | Disposition: A | Discharge status: Home / Self Care | Payer: Medicare Other | Source: Ambulatory Visit | Attending: Family Medicine | Admitting: Family Medicine

## 2015-08-27 ENCOUNTER — Other Ambulatory Visit: Payer: Self-pay | Admitting: Family Medicine

## 2015-08-27 ENCOUNTER — Ambulatory Visit (HOSPITAL_COMMUNITY): Admission: RE | Admit: 2015-08-27 | Payer: Medicare Other | Source: Ambulatory Visit

## 2015-08-27 ENCOUNTER — Other Ambulatory Visit (HOSPITAL_COMMUNITY): Payer: Medicare Other

## 2015-08-27 ENCOUNTER — Ambulatory Visit (INDEPENDENT_AMBULATORY_CARE_PROVIDER_SITE_OTHER): Payer: Medicare Other | Admitting: Family Medicine

## 2015-08-27 VITALS — BP 133/80 | HR 97 | Temp 97.7°F | Ht 62.0 in | Wt 134.0 lb

## 2015-08-27 DIAGNOSIS — G8929 Other chronic pain: Secondary | ICD-10-CM | POA: Diagnosis not present

## 2015-08-27 DIAGNOSIS — R1011 Right upper quadrant pain: Secondary | ICD-10-CM

## 2015-08-27 DIAGNOSIS — K869 Disease of pancreas, unspecified: Secondary | ICD-10-CM | POA: Insufficient documentation

## 2015-08-27 DIAGNOSIS — R1010 Upper abdominal pain, unspecified: Secondary | ICD-10-CM | POA: Diagnosis not present

## 2015-08-27 DIAGNOSIS — M47816 Spondylosis without myelopathy or radiculopathy, lumbar region: Secondary | ICD-10-CM | POA: Insufficient documentation

## 2015-08-27 DIAGNOSIS — C787 Secondary malignant neoplasm of liver and intrahepatic bile duct: Secondary | ICD-10-CM | POA: Diagnosis not present

## 2015-08-27 DIAGNOSIS — R590 Localized enlarged lymph nodes: Secondary | ICD-10-CM | POA: Diagnosis not present

## 2015-08-27 DIAGNOSIS — C801 Malignant (primary) neoplasm, unspecified: Secondary | ICD-10-CM | POA: Diagnosis not present

## 2015-08-27 DIAGNOSIS — M419 Scoliosis, unspecified: Secondary | ICD-10-CM | POA: Insufficient documentation

## 2015-08-27 MED ORDER — GADOBENATE DIMEGLUMINE 529 MG/ML IV SOLN
15.0000 mL | Freq: Once | INTRAVENOUS | Status: AC | PRN
  Administered 2015-08-27: 12 mL via INTRAVENOUS

## 2015-08-27 NOTE — Progress Notes (Signed)
   Subjective:    Patient ID: Wendy Palmer, female    DOB: [birth year], 64 y.o.   MRN: [id number]  HPI 64 year old female who has some right upper quadrant pain worse with satiety. There is some nausea and vomiting. Pain radiates to her back and shoulder at times she thinks it may be gallbladder disease. We spent some time discussing gallbladder symptoms and what makes gallstones etc.  Patient Active Problem List   Diagnosis Date Noted  . Dyspnea 06/08/2014  . Hot flashes, menopausal 03/13/2014  . Rosacea 12/21/2012  . Essential hypertension 12/21/2012  . Hyperlipemia 12/21/2012   Outpatient Encounter Prescriptions as of 08/27/2015  Medication Sig  . acetaminophen-codeine (TYLENOL #3) 300-30 MG tablet One every 4 hours as needed for cough  . amLODipine (NORVASC) 2.5 MG tablet TAKE 1 TABLET (2.5 MG TOTAL) BY MOUTH DAILY.  Marland Kitchen aspirin 81 MG tablet Take 81 mg by mouth daily.    . Cholecalciferol (VITAMIN D) 1000 UNITS capsule Take 1,000 Units by mouth daily.  . Cyanocobalamin (B-12 PO) Take by mouth daily.  . famotidine (PEPCID) 20 MG tablet One at bedtime  . imipramine (TOFRANIL) 50 MG tablet Take 2 tablets (100 mg total) by mouth at bedtime.  . Multiple Vitamin (MULTIVITAMIN) tablet Take 1 tablet by mouth daily.  . pantoprazole (PROTONIX) 40 MG tablet Take 1 tablet (40 mg total) by mouth daily. Take 30-60 min before first meal of the day   No facility-administered encounter medications on file as of 08/27/2015.      Review of Systems  Constitutional: Negative.   Respiratory: Negative.   Cardiovascular: Negative.   Gastrointestinal: Positive for nausea and abdominal pain.  Musculoskeletal: Negative.   Psychiatric/Behavioral: Negative.        Objective:   Physical Exam  Constitutional: She is oriented to person, place, and time. She appears well-developed and well-nourished.  Abdominal: There is tenderness.  No mass or organomegaly there is some tenderness to percussion over the  right upper quadrant  Neurological: She is alert and oriented to person, place, and time.          Assessment & Plan:  1. Abdominal pain, chronic, right upper quadrant Symptoms are suspicious for gallbladder disease. We'll send for ultrasound. If that is negative consider abdominal CT.  Wardell Honour MD - US Abdomen Limited RUQ; Future

## 2015-08-28 ENCOUNTER — Other Ambulatory Visit: Payer: Self-pay | Admitting: *Deleted

## 2015-08-28 ENCOUNTER — Other Ambulatory Visit: Payer: Self-pay | Admitting: Family Medicine

## 2015-08-28 ENCOUNTER — Telehealth: Payer: Self-pay | Admitting: Family Medicine

## 2015-08-28 ENCOUNTER — Telehealth: Payer: Self-pay | Admitting: *Deleted

## 2015-08-28 ENCOUNTER — Ambulatory Visit: Payer: Medicare Other | Admitting: Family Medicine

## 2015-08-28 DIAGNOSIS — K8689 Other specified diseases of pancreas: Secondary | ICD-10-CM

## 2015-08-28 DIAGNOSIS — C799 Secondary malignant neoplasm of unspecified site: Secondary | ICD-10-CM

## 2015-08-28 DIAGNOSIS — R160 Hepatomegaly, not elsewhere classified: Secondary | ICD-10-CM

## 2015-08-28 MED ORDER — LORAZEPAM 0.5 MG PO TABS: 0.5000 mg | ORAL_TABLET | Freq: Three times a day (TID) | ORAL | Status: DC | PRN

## 2015-08-28 NOTE — Telephone Encounter (Signed)
Per [facility]  - will discuss with Pam at appt

## 2015-08-28 NOTE — Telephone Encounter (Signed)
Spoke with Carlon with [facility]: Dr. Benay Spice requesting US biopsy of liver mass for tissue diagnosis. She agrees that they will be glad to begin this process.

## 2015-08-28 NOTE — Telephone Encounter (Signed)
Oncology Nurse Navigator Documentation  Oncology Nurse Navigator Flowsheets 08/28/2015  Referral date to RadOnc/MedOnc 08/28/2015  Navigator Encounter Type Introductory phone call  Spoke with patient and her daughter and provided new patient appointment for 09/05/15 at 3 pm to see Dr. Benay Spice. Made her aware that her PCP will be setting her up for US biopsy of liver for tissue diagnosis. Informed of location of [place], valet service, and registration process. Reminded to bring insurance cards and a current medication list, including supplements. Patient verbalizes understanding. New patient packet mailed to home. Daughter concerned about waiting too long to start treatment. Explained that 09/05/15 will be OK and need tissue diagnosis to confirm type of cancer-adeno vs NET and that treatment and prognosis is different.

## 2015-08-30 ENCOUNTER — Telehealth: Payer: Self-pay | Admitting: *Deleted

## 2015-08-30 ENCOUNTER — Other Ambulatory Visit: Payer: Self-pay | Admitting: *Deleted

## 2015-08-30 DIAGNOSIS — C259 Malignant neoplasm of pancreas, unspecified: Secondary | ICD-10-CM

## 2015-08-30 MED ORDER — HYDROCODONE-ACETAMINOPHEN 5-325 MG PO TABS: ORAL_TABLET | ORAL | Status: DC

## 2015-08-30 NOTE — Telephone Encounter (Signed)
Dr Laurance Flatten is off today but was contacted about this patient and so he called into the office to relay orders. She was recently diagnosed with pancreatic cancer with liver mets. We have her scheduled internally but her family would like to have her seen at [facility].  I spoke with her daughter, Esmond Camper, and she has a contact on the medical board at [facility]. She will take care of scheduling the appointments herself. I advised that we would be happy to provide any necessary records or reports.  She also asked for pain medication for her mother. She is having pain during the day and difficulty sleeping at night because of the RUQ and back pain.  Explained that we can't call in narcotics and that someone would have to pickup a written prescription. Left Dr Laurance Flatten a voicemail requesting a verbal order so that I can have a provider that is working today write the prescription for them to pickup.  I will call Esmond Camper back once I have these.

## 2015-08-30 NOTE — Telephone Encounter (Signed)
Spoke with Dr Laurance Flatten by phone. He ordered hydrocodone 5/325, 1 tablet QID PRN pain.  I will forward to a provider that is working today for signature and call daughter, Esmond Camper when ready for pickup.

## 2015-08-30 NOTE — Telephone Encounter (Signed)
Wendy Palmer (dtr) called concerned that her mom's biopsy has not been scheduled.  They have an appointment on 09/05/15 with Dr. Benay Spice and they need the results before they see him.  Talked with Carlon at [facility] and then Dr.Sherrill's nurse/Tanya and then [place] and [place] all in Radiology.  This procedure will need "approval by the radiologist' and possibly insurance approval.  Because it will occur after the first of the year, it may be that pre-certification may not happen until after the first of the year b/c of insurance changes.  Elmo Putt stated she had left a message on patient's phone but she had not called back.  Requested that Elmo Putt call the dtr at [phone].  Let Esmond Camper know that Rolla Plate said she would call her and that she had already left a message on her mom's phone.  Esmond Camper appreciated our help in trying to get information.  Let Esmond Camper know that for right now will keep appt. With Dr.Sherrill on 09/05/15.

## 2015-09-03 ENCOUNTER — Telehealth: Payer: Self-pay | Admitting: *Deleted

## 2015-09-03 NOTE — Telephone Encounter (Signed)
Call from daughter, Esmond Camper asking for status of the liver biopsy on her mother. Very distressed due to losing her father to pancreatic cancer in past and wants navigator to schedule the biopsy now. Insists that this is the navigator job and it should have been scheduled. Explained to her to process of review of case by radiologist before scheduling the best procedure and how to obtain biopsy. Provided her the phone # for central scheduling to call herself.  She adds that her mother has Stage IV pancreatic cancer and needs biopsy now-frustrated with being told the holidays have impacted schedules. Made her aware that Dr. Benay Spice is going to see her on 09/05/15 regardless of having tissue diagnosis or not. He can still discuss the case and make tentative treatment plans for her.

## 2015-09-03 NOTE — Telephone Encounter (Signed)
  Oncology Nurse Navigator Documentation    Navigator Encounter Type: Telephone (09/03/15 1113): Left VM with Elmo Putt in [place] Scheduling to follow up on status of US biopsy of liver that was ordered on 08/28/15 by Dr. Laurance Flatten. Per Dr. Benay Spice, [place] to still see her as new patient on 09/05/15 even if results are not available yet. Left this message on patient's voice mail. Daughter, Esmond Camper had requested nurse call her back [phone], however she is not on any release of information in chart, thus returned call to patient.

## 2015-09-03 NOTE — Telephone Encounter (Signed)
Noted in EPIC that US biopsy is scheduled for 09/09/15.

## 2015-09-04 ENCOUNTER — Ambulatory Visit: Payer: Medicare Other | Admitting: Pulmonary Disease

## 2015-09-05 ENCOUNTER — Encounter: Payer: Self-pay | Admitting: *Deleted

## 2015-09-05 ENCOUNTER — Ambulatory Visit (HOSPITAL_BASED_OUTPATIENT_CLINIC_OR_DEPARTMENT_OTHER): Payer: PPO | Admitting: Oncology

## 2015-09-05 ENCOUNTER — Encounter: Payer: Self-pay | Admitting: Oncology

## 2015-09-05 VITALS — BP 157/77 | HR 90 | Temp 97.7°F | Resp 18 | Ht 62.0 in | Wt 135.3 lb

## 2015-09-05 DIAGNOSIS — K5900 Constipation, unspecified: Secondary | ICD-10-CM | POA: Diagnosis not present

## 2015-09-05 DIAGNOSIS — G893 Neoplasm related pain (acute) (chronic): Secondary | ICD-10-CM | POA: Diagnosis not present

## 2015-09-05 DIAGNOSIS — C252 Malignant neoplasm of tail of pancreas: Secondary | ICD-10-CM

## 2015-09-05 DIAGNOSIS — C787 Secondary malignant neoplasm of liver and intrahepatic bile duct: Secondary | ICD-10-CM | POA: Diagnosis not present

## 2015-09-05 NOTE — Progress Notes (Signed)
Wendy Palmer   Referring MD: Wendy Herb, Md [address] [place], [place] [address]   Wendy Palmer 65 y.o.  Feb 12, 1951    Reason for Referral: Pancreas mass, liver lesions   HPI: She reports a 2 to three-month history of progressive back and upper abdominal pain. Aleve partially improved the pain. Treatment for reflux did not help. She was seen for a follow-up visit at [facility] family practice with persistent pain on 08/27/2015. An abdominal ultrasound revealed multiple hypoechoic lesions in the liver. No gallstones or wall thickening. She was referred for an MRI of the liver on 08/27/2015. Scattered hepatic masses were identified consistent with metastatic disease. A mass was noted in the tail of the pancreas. Indistinct enhancing tissue was noted around the celiac trunk potentially related to local invasion or nodal spread. A conglomerate nodal mass was noted at the gastrohepatic window. Prominent stool was noted throughout the colon.  She takes Aleve and Tylenol No. 3 for pain with partial relief.   Past Medical History  Diagnosis Date  . Diverticulosis   . Depression   . Patella fracture   . Hypertension     .  G2 P2  Past Surgical History  Procedure Laterality Date  . Tubal ligation    . Refractive surgery      Medications: Reviewed  Allergies:  Allergies  Allergen Reactions  . Macrobid [Nitrofurantoin Macrocrystal] Rash  . Ace Inhibitors Cough    Family history: Her mother had colon cancer at age 39. No other family history of cancer.  Social History:   She lives in [place]. She is a Print production planner. Her husband died of pancreas cancer at age 11. She does not use tobacco. Rare alcohol use. No risk factors for HIV or hepatitis.  History  Alcohol Use  . 0.6 oz/week  . 1 Standard drinks or equivalent per week    History  Smoking status  . Former Smoker -- 0.25 packs/day for 25 years  . Types: Cigarettes    . Quit date: 08/31/1976  Smokeless tobacco  . Never Used      ROS:   Positives include: Anorexia, 5 pound weight loss, constipation, mild nausea, abdomen/back pain, malaise, dry mouth  A complete ROS was otherwise negative.  Physical Exam:  Blood pressure 157/77, pulse 90, temperature 97.7 F (36.5 C), temperature source Oral, resp. rate 18, height 5\' 2"  (1.575 m), weight 135 lb 4.8 oz (61.372 kg), SpO2 98 %.  HEENT: Oropharynx without visible mass, neck without mass Lungs: Clear bilaterally Cardiac: Regular rate and rhythm Abdomen: No hepatosplenomegaly, no mass, no apparent ascites, tender in the mid upper abdomen  Vascular: No leg edema Lymph nodes: No cervical, supraclavicular, axillary, or inguinal nodes Neurologic: Alert and oriented, the motor exam appears intact in the upper and lower extremities Skin: No rash Musculoskeletal: No spine tenderness   Imaging:  As per history of present illness, MRI abdomen 08/27/2015 reviewed with the patient and her family   Assessment/Plan:   1. Pancreas tail mass, liver lesions  MRI abdomen 08/27/2015 revealed a pancreas mass, adenopathy, and liver metastases  2.   Pain secondary to #1  3.   Constipation-likely secondary to narcotics   Disposition:   Wendy Palmer appears to have metastatic pancreas cancer, most likely adenocarcinoma. The differential diagnosis includes another histology such as a pancreatic neuroendocrine tumor. I discussed the probable diagnosis of metastatic pancreas adenocarcinoma, the prognosis, and treatment options with Wendy Palmer and her  family.  She is scheduled for an ultrasound-guided biopsy of a liver lesion on 09/09/2015 to confirm a tissue diagnosis. We will schedule staging CT scans and a CA 19-9 for the same day.  She will try Vicodin for pain. She will take MiraLAX and a stool softener for the constipation.  She understands no therapy will be curative if a diagnosis of pancreas cancer is  confirmed. We discussed systemic treatment options including standard treatment with gemcitabine/Abraxane, FOLFIRINOX, and enrollment on the SWOG S1313 study.  Wendy Palmer will return for an office visit and further discussion on 09/11/2015. She will be referred for placement of a Port-A-Cath and a chemotherapy teaching class.  Approximately 50 minutes were spent with the patient today. The majority of the time was used for counseling and coordination of care.  [place], [place] 09/05/2015, 4:03 PM

## 2015-09-05 NOTE — Progress Notes (Signed)
Oncology Nurse Navigator Documentation  Oncology Nurse Navigator Flowsheets 09/05/2015  Navigator Location [facility]-Med Onc  Navigator Encounter Type Initial MedOnc  Abnormal Finding Date 08/27/2015  Patient Visit Type MedOnc  Treatment Phase Abnormal Scans  Barriers/Navigation Needs Education;Family concerns;Coordination of Care  Education Understanding Cancer/ Treatment Options;Coping with Diagnosis/ Prognosis;Pain/ Symptom Management;Newly Diagnosed Cancer Education;Preparing for Upcoming Surgery/ Treatment  Interventions Referrals;Education Method  Referrals Nutrition/dietician  Education Method Verbal;Written;Teach-back  Support Groups/Services Other;GI Support Group  Acuity Level 2  Time Spent with Patient 60  Met with patient, daughters Bonnita Nasuti and Esmond Camper, and her friend, Shanon Brow during new patient visit. Explained the role of the GI Nurse Navigator and provided New Patient Packet with information on: 1. Pancreas cancer--drug sheets for Gemzar/Abraxane and FOLFIRINOX; handout on PAC and general information on chemotherapy 2. Support groups 3. Advanced Directives 4. Fall Safety Plan Answered questions, reviewed current treatment plan using TEACH back and provided emotional support. Provided copy of current treatment plan. Will attempt to schedule her CT scan on day of her US biopsy 09/09/15. Will also work on getting her [facility] appointment scheduled. Message to education nurse to determine if she can have a 1-on-1 class after her MD visit on 1/11. Patient and family are very anxious since loss of father to same cancer and would not be good for a group class. Will also request dietician to see her on her 1st day of chemotherapy week of 09/16/15.  Merceda Elks, RN, BSN GI Oncology [place]

## 2015-09-06 ENCOUNTER — Telehealth: Payer: Self-pay | Admitting: *Deleted

## 2015-09-06 ENCOUNTER — Other Ambulatory Visit: Payer: Self-pay | Admitting: Radiology

## 2015-09-06 ENCOUNTER — Telehealth: Payer: Self-pay | Admitting: Oncology

## 2015-09-06 NOTE — Telephone Encounter (Signed)
Oncology Nurse Navigator Documentation  Oncology Nurse Navigator Flowsheets 09/06/2015  Navigator Location [facility]Med Onc  Navigator Encounter Type Telephone  Telephone Outgoing Call  Abnormal Finding Date -  Patient Visit Type -  Treatment Phase -  Barriers/Navigation Needs Coordination of Care--was able to schedule CT scan on 09/09/15 after her [place] at 1 pm.  Education NPO for scans: drink contrast at 3:30 and 4:30 on 09/09/15-pick up at [facility] when she does her labs at [place].  Interventions -  Referrals -  Education Method Teach-back  Support Groups/Services -  Acuity -  Time Spent with Patient 9  Daughter asking for stronger pain medication, however she just got script filled yesterday and has only taken one pill thus far. Encouraged her to have Jamilya take it qid as ordered and let us know Monday if she still needs stronger pain med. Navigator can bring a script over to her in radiology later that day if she needs it. Daughter understands and agrees. Also made her aware that referral for nutrition will be made when she gets chemo appointment scheduled. Research RN will see them on 1/11 after OV and waiting response from education nurse to determine if she can do a one on one class for her.

## 2015-09-06 NOTE — Telephone Encounter (Signed)
Appointments for lab 1/9 prior to bx and BS 1/11 already on schedule. Bo other oders per 1/5 pof.

## 2015-09-06 NOTE — Telephone Encounter (Signed)
Patient has order for port placement via IR entered 1/5. Spoke with Tiffany in IR she will contact patient re port.

## 2015-09-09 ENCOUNTER — Ambulatory Visit (HOSPITAL_COMMUNITY)
Admission: RE | Admit: 2015-09-09 | Discharge: 2015-09-09 | Disposition: A | Discharge status: Home / Self Care | Payer: PPO | Source: Ambulatory Visit | Attending: Family Medicine | Admitting: Family Medicine

## 2015-09-09 ENCOUNTER — Encounter (HOSPITAL_COMMUNITY): Payer: Self-pay

## 2015-09-09 ENCOUNTER — Other Ambulatory Visit (HOSPITAL_BASED_OUTPATIENT_CLINIC_OR_DEPARTMENT_OTHER): Payer: PPO

## 2015-09-09 ENCOUNTER — Ambulatory Visit (HOSPITAL_COMMUNITY)
Admission: RE | Admit: 2015-09-09 | Discharge: 2015-09-09 | Disposition: A | Discharge status: Home / Self Care | Payer: PPO | Source: Ambulatory Visit | Attending: Oncology | Admitting: Oncology

## 2015-09-09 DIAGNOSIS — C787 Secondary malignant neoplasm of liver and intrahepatic bile duct: Secondary | ICD-10-CM | POA: Insufficient documentation

## 2015-09-09 DIAGNOSIS — C252 Malignant neoplasm of tail of pancreas: Secondary | ICD-10-CM | POA: Insufficient documentation

## 2015-09-09 DIAGNOSIS — R160 Hepatomegaly, not elsewhere classified: Secondary | ICD-10-CM | POA: Diagnosis present

## 2015-09-09 DIAGNOSIS — Z87891 Personal history of nicotine dependence: Secondary | ICD-10-CM | POA: Diagnosis not present

## 2015-09-09 DIAGNOSIS — Z7982 Long term (current) use of aspirin: Secondary | ICD-10-CM | POA: Insufficient documentation

## 2015-09-09 DIAGNOSIS — R188 Other ascites: Secondary | ICD-10-CM | POA: Insufficient documentation

## 2015-09-09 DIAGNOSIS — R590 Localized enlarged lymph nodes: Secondary | ICD-10-CM | POA: Diagnosis not present

## 2015-09-09 DIAGNOSIS — I10 Essential (primary) hypertension: Secondary | ICD-10-CM | POA: Diagnosis not present

## 2015-09-09 DIAGNOSIS — Z8249 Family history of ischemic heart disease and other diseases of the circulatory system: Secondary | ICD-10-CM | POA: Diagnosis not present

## 2015-09-09 DIAGNOSIS — R918 Other nonspecific abnormal finding of lung field: Secondary | ICD-10-CM | POA: Diagnosis not present

## 2015-09-09 DIAGNOSIS — F329 Major depressive disorder, single episode, unspecified: Secondary | ICD-10-CM | POA: Diagnosis not present

## 2015-09-09 DIAGNOSIS — C227 Other specified carcinomas of liver: Secondary | ICD-10-CM | POA: Insufficient documentation

## 2015-09-09 DIAGNOSIS — K5790 Diverticulosis of intestine, part unspecified, without perforation or abscess without bleeding: Secondary | ICD-10-CM | POA: Diagnosis not present

## 2015-09-09 LAB — COMPREHENSIVE METABOLIC PANEL
ALBUMIN: 3.4 g/dL — AB (ref 3.5–5.0)
ALK PHOS: 147 U/L (ref 40–150)
ALT: 37 U/L (ref 0–55)
AST: 27 U/L (ref 5–34)
Anion Gap: 9 mEq/L (ref 3–11)
BILIRUBIN TOTAL: 0.31 mg/dL (ref 0.20–1.20)
BUN: 18.8 mg/dL (ref 7.0–26.0)
CO2: 26 mEq/L (ref 22–29)
CREATININE: 0.8 mg/dL (ref 0.6–1.1)
Calcium: 9.8 mg/dL (ref 8.4–10.4)
Chloride: 103 mEq/L (ref 98–109)
EGFR: 78 mL/min/{1.73_m2} — AB (ref >90)
GLUCOSE: 103 mg/dL (ref 70–140)
Potassium: 4.3 mEq/L (ref 3.5–5.1)
SODIUM: 139 meq/L (ref 136–145)
TOTAL PROTEIN: 6.9 g/dL (ref 6.4–8.3)

## 2015-09-09 LAB — PROTIME-INR
INR: 1 — ABNORMAL LOW (ref 2.00–3.50)
PROTIME: 12 s (ref 10.6–13.4)

## 2015-09-09 LAB — CBC WITH DIFFERENTIAL/PLATELET
BASO%: 0.4 % (ref 0.0–2.0)
Basophils Absolute: 0 10*3/uL (ref 0.0–0.1)
EOS ABS: 0.3 10*3/uL (ref 0.0–0.5)
EOS%: 3.8 % (ref 0.0–7.0)
HCT: 35.4 % (ref 34.8–46.6)
HEMOGLOBIN: 11.6 g/dL (ref 11.6–15.9)
LYMPH%: 19.6 % (ref 14.0–49.7)
MCH: 28.2 pg (ref 25.1–34.0)
MCHC: 32.8 g/dL (ref 31.5–36.0)
MCV: 85.9 fL (ref 79.5–101.0)
MONO#: 0.5 10*3/uL (ref 0.1–0.9)
MONO%: 5.9 % (ref 0.0–14.0)
NEUT%: 70.3 % (ref 38.4–76.8)
NEUTROS ABS: 5.3 10*3/uL (ref 1.5–6.5)
Platelets: 201 10*3/uL (ref 145–400)
RBC: 4.12 10*6/uL (ref 3.70–5.45)
RDW: 13.8 % (ref 11.2–14.5)
WBC: 7.6 10*3/uL (ref 3.9–10.3)
lymph#: 1.5 10*3/uL (ref 0.9–3.3)

## 2015-09-09 MED ORDER — MIDAZOLAM HCL 2 MG/2ML IJ SOLN
INTRAMUSCULAR | Status: AC | PRN
  Administered 2015-09-09 (×3): 1 mg via INTRAVENOUS

## 2015-09-09 MED ORDER — MIDAZOLAM HCL 2 MG/2ML IJ SOLN
INTRAMUSCULAR | Status: AC
  Filled 2015-09-09: qty 6

## 2015-09-09 MED ORDER — HYDROCODONE-ACETAMINOPHEN 5-325 MG PO TABS
1.0000 | ORAL_TABLET | Freq: Once | ORAL | Status: AC
  Administered 2015-09-09: 1 via ORAL
  Filled 2015-09-09 (×2): qty 1

## 2015-09-09 MED ORDER — IOHEXOL 300 MG/ML  SOLN
100.0000 mL | Freq: Once | INTRAMUSCULAR | Status: AC | PRN
  Administered 2015-09-09: 100 mL via INTRAVENOUS

## 2015-09-09 MED ORDER — FENTANYL CITRATE (PF) 100 MCG/2ML IJ SOLN
INTRAMUSCULAR | Status: AC | PRN
  Administered 2015-09-09: 50 ug via INTRAVENOUS

## 2015-09-09 MED ORDER — FENTANYL CITRATE (PF) 100 MCG/2ML IJ SOLN
INTRAMUSCULAR | Status: AC
  Filled 2015-09-09: qty 4

## 2015-09-09 MED ORDER — SODIUM CHLORIDE 0.9 % IV SOLN
INTRAVENOUS | Status: DC
  Administered 2015-09-09: 500 mL via INTRAVENOUS

## 2015-09-09 NOTE — Procedures (Signed)
Liver Bx 18 g biopsy No comp/EBL

## 2015-09-09 NOTE — Discharge Instructions (Signed)
Liver Biopsy, Care After °Refer to this sheet in the next few weeks. These instructions provide you with information on caring for yourself after your procedure. Your health care provider may also give you more specific instructions. Your treatment has been planned according to current medical practices, but problems sometimes occur. Call your health care provider if you have any problems or questions after your procedure. °WHAT TO EXPECT AFTER THE PROCEDURE °After your procedure, it is typical to have the following: °· A small amount of discomfort in the area where the biopsy was done and in the right shoulder or shoulder blade. °· A small amount of bruising around the area where the biopsy was done and on the skin over the liver. °· Sleepiness and fatigue for the rest of the day. °HOME CARE INSTRUCTIONS  °· Rest at home for 1-2 days or as directed by your health care provider. °· Have a friend or family member stay with you for at least 24 hours. °· Because of the medicines used during the procedure, you should not do the following things in the first 24 hours: °¨ Drive. °¨ Use machinery. °¨ Be responsible for the care of other people. °¨ Sign legal documents. °¨ Take a bath or shower. °· There are many different ways to close and cover an incision, including stitches, skin glue, and adhesive strips. Follow your health care provider's instructions on: °¨ Incision care. °¨ Bandage (dressing) changes and removal. °¨ Incision closure removal. °· Do not drink alcohol in the first week. °· Do not lift more than 5 pounds or play contact sports for 2 weeks after this test. °· Take medicines only as directed by your health care provider. Do not take medicine containing aspirin or non-steroidal anti-inflammatory medicines such as ibuprofen for 1 week after this test. °· It is your responsibility to get your test results. °SEEK MEDICAL CARE IF:  °· You have increased bleeding from an incision that results in more than a  small spot of blood. °· You have redness, swelling, or increasing pain in any incisions. °· You notice a discharge or a bad smell coming from any of your incisions. °· You have a fever or chills. °SEEK IMMEDIATE MEDICAL CARE IF:  °· You develop swelling, bloating, or pain in your abdomen. °· You become dizzy or faint. °· You develop a rash. °· You are nauseous or vomit. °· You have difficulty breathing, feel short of breath, or feel faint. °· You develop chest pain. °· You have problems with your speech or vision. °· You have trouble balancing or moving your arms or legs. °  °This information is not intended to replace advice given to you by your health care provider. Make sure you discuss any questions you have with your health care provider. °  °Document Released: 03/06/2005 Document Revised: 09/07/2014 Document Reviewed: 10/13/2013 °Elsevier Interactive Patient Education ©2016 [employer] ° °Moderate Conscious Sedation, Adult, Care After °Refer to this sheet in the next few weeks. These instructions provide you with information on caring for yourself after your procedure. Your health care provider may also give you more specific instructions. Your treatment has been planned according to current medical practices, but problems sometimes occur. Call your health care provider if you have any problems or questions after your procedure. °WHAT TO EXPECT AFTER THE PROCEDURE  °After your procedure: °· You may feel sleepy, clumsy, and have poor balance for several hours. °· Vomiting may occur if you eat too soon after the procedure. °  HOME CARE INSTRUCTIONS °· Do not participate in any activities where you could become injured for at least 24 hours. Do not: °¨ Drive. °¨ Swim. °¨ Ride a bicycle. °¨ Operate heavy machinery. °¨ Cook. °¨ Use power tools. °¨ Climb ladders. °¨ Work from a high place. °· Do not make important decisions or sign legal documents until you are improved. °· If you vomit, drink water, juice, or soup  when you can drink without vomiting. Make sure you have little or no nausea before eating solid foods. °· Only take over-the-counter or prescription medicines for pain, discomfort, or fever as directed by your health care provider. °· Make sure you and your family fully understand everything about the medicines given to you, including what side effects may occur. °· You should not drink alcohol, take sleeping pills, or take medicines that cause drowsiness for at least 24 hours. °· If you smoke, do not smoke without supervision. °· If you are feeling better, you may resume normal activities 24 hours after you were sedated. °· Keep all appointments with your health care provider. °SEEK MEDICAL CARE IF: °· Your skin is pale or bluish in color. °· You continue to feel nauseous or vomit. °· Your pain is getting worse and is not helped by medicine. °· You have bleeding or swelling. °· You are still sleepy or feeling clumsy after 24 hours. °SEEK IMMEDIATE MEDICAL CARE IF: °· You develop a rash. °· You have difficulty breathing. °· You develop any type of allergic problem. °· You have a fever. °MAKE SURE YOU: °· Understand these instructions. °· Will watch your condition. °· Will get help right away if you are not doing well or get worse. °  °This information is not intended to replace advice given to you by your health care provider. Make sure you discuss any questions you have with your health care provider. °  °Document Released: 06/07/2013 Document Revised: 09/07/2014 Document Reviewed: 06/07/2013 °Elsevier Interactive Patient Education ©2016 [employer] ° ° °

## 2015-09-09 NOTE — H&P (Signed)
Chief Complaint: Patient was seen in consultation today for US guided liver lesion biopsy  Referring Physician(s): Moore,Donald W  History of Present Illness: Wendy Palmer is a 65 y.o. female with 2-3 month history of abdominal pain and subsequent imaging revealing pancreatic tail mass, adenopathy and liver lesions. She presents today for US guided liver lesion biopsy for further evaluation.   Past Medical History  Diagnosis Date  . Diverticulosis   . Depression   . Patella fracture   . Hypertension     Past Surgical History  Procedure Laterality Date  . Tubal ligation    . Refractive surgery      Allergies: Macrobid and Ace inhibitors  Medications: Prior to Admission medications   Medication Sig Start Date End Date Taking? Authorizing Provider  amLODipine (NORVASC) 2.5 MG tablet TAKE 1 TABLET (2.5 MG TOTAL) BY MOUTH DAILY. 07/09/15   Chipper Herb, MD  aspirin 81 MG tablet Take 81 mg by mouth daily. Will hold till after biopsy    Historical Provider, MD  calcium carbonate (TUMS - DOSED IN MG ELEMENTAL CALCIUM) 500 MG chewable tablet Chew 1 tablet by mouth daily as needed for indigestion or heartburn.    Historical Provider, MD  Cholecalciferol (VITAMIN D) 1000 UNITS capsule Take 1,000 Units by mouth daily.    Historical Provider, MD  Cyanocobalamin (B-12 PO) Take by mouth daily.    Historical Provider, MD  docusate sodium (COLACE) 100 MG capsule Take 100 mg by mouth 2 (two) times daily. 09/05/15   Ladell Pier, MD  famotidine (PEPCID) 20 MG tablet One at bedtime 08/19/15   Tanda Rockers, MD  HYDROcodone-acetaminophen (NORCO/VICODIN) 5-325 MG tablet Take 1 tablet 4 times daily as needed 08/30/15   Mary-Margaret Hassell Done, FNP  imipramine (TOFRANIL) 50 MG tablet Take 2 tablets (100 mg total) by mouth at bedtime. 07/09/15   Chipper Herb, MD  LORazepam (ATIVAN) 0.5 MG tablet Take 1 tablet (0.5 mg total) by mouth 3 (three) times daily as needed for anxiety. 08/28/15    Chipper Herb, MD  Multiple Vitamin (MULTIVITAMIN) tablet Take 1 tablet by mouth daily.    Historical Provider, MD  pantoprazole (PROTONIX) 40 MG tablet Take 1 tablet (40 mg total) by mouth daily. Take 30-60 min before first meal of the day 08/19/15   Tanda Rockers, MD  polyethylene glycol [facility] / GLYCOLAX) packet Take 17 g by mouth daily. 09/05/15   Ladell Pier, MD     Family History  Problem Relation Age of Onset  . Colon cancer Mother   . Heart failure Father     CHF, lived to be 80  . Alzheimer's disease Mother   . CAD Brother 7  . Heart disease Maternal Grandfather 19  . Heart disease Maternal Grandmother 73    Social History   Social History  . Marital Status: Widowed    Spouse Name: N/A  . Number of Children: 2  . Years of Education: N/A   Occupational History  . Teacher    Social History Main Topics  . Smoking status: Former Smoker -- 0.25 packs/day for 25 years    Types: Cigarettes    Quit date: 08/31/1976  . Smokeless tobacco: Never Used  . Alcohol Use: 0.6 oz/week    1 Standard drinks or equivalent per week  . Drug Use: No  . Sexual Activity: Not on file   Other Topics Concern  . Not on file   Social History Narrative  Widowed,lives alone.  (Husband died of pancreas cancer)   #2 daughters, Bonnita Nasuti and Esmond Camper   Preschool teacher   Independent in ADLs      Review of Systems  Constitutional: Negative for fever and chills.  Respiratory: Positive for shortness of breath. Negative for cough.   Cardiovascular: Negative for chest pain.  Gastrointestinal: Positive for abdominal pain. Negative for nausea, vomiting and blood in stool.  Genitourinary: Negative for dysuria and hematuria.  Musculoskeletal: Positive for back pain.  Neurological: Negative for headaches.    Vital Signs: BP 150/70 mmHg  Pulse 84  Temp(Src) 97.8 F (36.6 C) (Oral)  Resp 18  SpO2 97%  Physical Exam  Constitutional: She is oriented to person, place, and time. She  appears well-developed and well-nourished.  Cardiovascular: Normal rate and regular rhythm.   Pulmonary/Chest: Effort normal and breath sounds normal.  Abdominal: Soft. Bowel sounds are normal. There is tenderness.  Musculoskeletal: Normal range of motion. She exhibits no edema.  Neurological: She is alert and oriented to person, place, and time.    Mallampati Score:     Imaging: Mr Liver W Wo Contrast  08/28/2015  CLINICAL DATA:  Right upper quadrant abdominal pain over the past 2 months. Solid-appearing hypoechoic liver lesions on ultrasound. EXAM: MRI ABDOMEN WITHOUT AND WITH CONTRAST TECHNIQUE: Multiplanar multisequence MR imaging of the abdomen was performed both before and after the administration of intravenous contrast. CONTRAST:  76mL MULTIHANCE GADOBENATE DIMEGLUMINE 529 MG/ML IV SOLN COMPARISON:  08/27/2015 FINDINGS: Lower chest:  Unremarkable Hepatobiliary: Scattered hepatic masses are identified, with enhancing margins and hypoenhancing center is compatible with internal necrosis. Overall enhancement pattern compatible with metastatic disease. Index lesion in segment 2 of the liver measures 1.8 by 1.2 cm on image 12 series 3. An index lesion at the junction of segments 7 and 8 measures 2.1 by 2.0 cm on image 14 series 3. A lesion posteriorly in segment 4b measures 1.9 by 1.7 cm on image 15 series 3. Note is made of some accentuated T1 signal peripherally in the right hepatic lobe marginal to a right hepatic lobe mass, as shown on image 33 series 7, likely related to altered perfusion in this small segment due to the mass. Pancreas: There is a mass in the pancreas with the following characteristics. Size: 6.2 by 2.4 by 2.6 cm Location: Tail, partially extending into the pancreatic body where there is a slightly hyper enhancing portion. Characterization: Mixed, with cystic components which may represent dilated portions of the dorsal pancreatic duct along the pancreatic tail Enhancement:  Very hypoenhancing especially on early images, with some slightly hyper enhancing component more centrally along the pancreatic body is for example on image 43 series 1102. Other Characteristics: No obvious adipose tissue. Questionable connectivity to the dorsal pancreatic duct. Local extent of mass: No compelling findings of gastric, splenic, or adrenal invasion. Vascular Involvement: Accentuated vascularity surrounding the mass likely from collateral vessels or tumor vascularity. Indistinct enhancing tissue around portions of the celiac trunk for example on image 42 of series 1102, which could be from local invasion or indistinctly marginated nodal spread. Some of this is present along the upper margin of the SMA but without a dominant SMA perivascular involvement. Variant hepatic artery anatomy: The dominant arterial blood supply to the liver appears to be from the celiac trunk in the expected fashion. There is potentially a tiny vessel coming off of the SMA around image 44 of series 1101 but I have difficulty following this beyond about 1 cm from  the SMA and I am skeptical that this is extending to the liver. Bile Duct Involvement: No extrahepatic biliary involvement. Variant biliary anatomy: No Adjacent Nodes: Indistinctly marginated gastrohepatic window conglomerate nodal mass approximately 1.5 by 3.5 cm on image 36 series 1105. As noted above, there is indistinctly marginated enhancing tissue around the celiac trunk measuring approximately 4.0 by 2.1 cm which may represent local spread or nodal disease. Omental/Peritoneal Disease: Not identified Distant Metastases: Liver Spleen: Absent Adrenals/Urinary Tract: Unremarkable Stomach/Bowel: Prominent stool throughout the colon favors constipation. Vascular/Lymphatic: As noted above, there indistinct lymph nodes in the gastrohepatic ligament and around the celiac trunk and peripancreatic region posteriorly. The somewhat in developed the celiac trunk and extend  along a small portion of the dorsal margin of the SMA. Accentuated left upper quadrant vascularity potentially from collateral flow or tumor vascularity in the vicinity of the pancreatic tail mass. Other: No supplemental non-categorized findings. Musculoskeletal: Lumbar spondylosis and degenerative disc disease with mild levoconvex lumbar scoliosis, as shown on image 14 series 10. IMPRESSION: 1. Large pancreatic tail mass compatible with pancreatic adenocarcinoma, with metastatic disease to the liver, and celiac chain and gastrohepatic ligament adenopathy. The celiac chain adenopathy is indistinct and at least partially surrounds the celiac artery, and is also tangential to the dorsal margin of the SMA. 2.  Prominent stool throughout the colon favors constipation. Electronically Signed   By: Van Clines M.D.   On: 08/28/2015 08:25   US Abdomen Limited Ruq  08/27/2015  CLINICAL DATA:  Right upper quadrant pain for 2 months EXAM: US ABDOMEN LIMITED - RIGHT UPPER QUADRANT COMPARISON:  None. FINDINGS: Gallbladder: No gallstones or wall thickening visualized. No sonographic Murphy sign noted by sonographer. Common bile duct: Diameter: Normal caliber, 4 mm Liver: Multiple hypoechoic areas within the liver which do not appear to be cystic and are concerning for solid lesions. Index left hepatic lesion measures 1.9 cm. Index right hepatic lesion measures up to 2.5 cm. Largest lesion anteriorly in the liver, likely right hepatic lobe measures 4.8 cm. IMPRESSION: Numerous hypoechoic lesions within the liver concerning for metastatic disease. Recommend further evaluation with contrast-enhanced CT or MRI. Electronically Signed   By: Rolm Baptise M.D.   On: 08/27/2015 12:23    Labs:  CBC:  Recent Labs  09/14/14 1150 07/19/15 0801 09/09/15 0956  WBC  --  6.2 7.6  HGB 14.3  --  11.6  HCT  --  39.2 35.4  PLT  --  184 201    COAGS:  Recent Labs  09/09/15 0956  INR 1.00*    BMP:  Recent Labs   07/19/15 0801 09/09/15 0957  NA 138 139  K 5.1 4.3  CL 99  --   CO2 23 26  GLUCOSE 99 103  BUN 18 18.8  CALCIUM 10.1 9.8  CREATININE 0.74 0.8  GFRNONAA 85  --   GFRAA 98  --     LIVER FUNCTION TESTS:  Recent Labs  07/19/15 0801 09/09/15 0957  BILITOT 0.3 0.31  AST 31 27  ALT 32 37  ALKPHOS 119* 147  PROT 6.4 6.9  ALBUMIN 3.9 3.4*    TUMOR MARKERS: No results for input(s): AFPTM, CEA, CA199, CHROMGRNA in the last 8760 hours.  Assessment and Plan:  65 y.o. female with 2-3 month history of abdominal pain and subsequent imaging revealing pancreatic tail mass, adenopathy and liver lesions. She presents today for US guided liver lesion biopsy for further evaluation.Risks and benefits discussed with the patient/daughter including, but  not limited to bleeding, infection, damage to adjacent structures or low yield requiring additional tests.All of the patient's questions were answered, patient is agreeable to proceed.Consent signed and in chart.    Thank you for this interesting consult.  I greatly enjoyed meeting Wendy Palmer and look forward to participating in their care.  A copy of this report was sent to the requesting provider on this date.  Signed: D. Rowe Robert 09/09/2015, 11:50 AM   I spent a total of 15 minutes in face to face in clinical consultation, greater than 50% of which was counseling/coordinating care for US guided liver lesion biopsy

## 2015-09-10 ENCOUNTER — Ambulatory Visit (HOSPITAL_COMMUNITY): Payer: Medicare Other

## 2015-09-10 LAB — CANCER ANTIGEN 19-9: CA 19-9: 4 U/mL (ref 0–35)

## 2015-09-11 ENCOUNTER — Other Ambulatory Visit: Payer: Self-pay | Admitting: Radiology

## 2015-09-11 ENCOUNTER — Ambulatory Visit (HOSPITAL_BASED_OUTPATIENT_CLINIC_OR_DEPARTMENT_OTHER): Payer: PPO | Admitting: Oncology

## 2015-09-11 ENCOUNTER — Other Ambulatory Visit: Payer: Self-pay | Admitting: *Deleted

## 2015-09-11 ENCOUNTER — Other Ambulatory Visit: Payer: PPO

## 2015-09-11 ENCOUNTER — Telehealth: Payer: Self-pay | Admitting: *Deleted

## 2015-09-11 ENCOUNTER — Encounter: Payer: Self-pay | Admitting: *Deleted

## 2015-09-11 VITALS — BP 162/66 | HR 89 | Temp 97.9°F | Resp 18 | Wt 132.7 lb

## 2015-09-11 DIAGNOSIS — C252 Malignant neoplasm of tail of pancreas: Secondary | ICD-10-CM

## 2015-09-11 DIAGNOSIS — K5900 Constipation, unspecified: Secondary | ICD-10-CM | POA: Diagnosis not present

## 2015-09-11 DIAGNOSIS — C787 Secondary malignant neoplasm of liver and intrahepatic bile duct: Secondary | ICD-10-CM | POA: Diagnosis not present

## 2015-09-11 DIAGNOSIS — G893 Neoplasm related pain (acute) (chronic): Secondary | ICD-10-CM | POA: Diagnosis not present

## 2015-09-11 MED ORDER — ONDANSETRON HCL 8 MG PO TABS: 8.0000 mg | ORAL_TABLET | Freq: Three times a day (TID) | ORAL | Status: DC | PRN

## 2015-09-11 NOTE — Progress Notes (Addendum)
[facility] OFFICE PROGRESS NOTE   Diagnosis: Pancreas cancer  INTERVAL HISTORY:   Wendy Palmer returns as scheduled. She continues to have abdomen and back pain. She has not tried hydrocodone. She also has discomfort in the subxiphoid region. She underwent an ultrasound-guided biopsy of a right liver lesion on 09/10/2015. The pathology confirmed adenocarcinoma.  She had staging CTs on the same day. This confirmed a pancreas body/tail mass, encasement of the celiac axis, and numerous hepatic metastases. Gastrohepatic ligament lymphadenopathy and innumerable tiny pulmonary nodules were noted.    Objective:  Vital signs in last 24 hours:  Blood pressure 162/66, pulse 89, temperature 97.9 F (36.6 C), temperature source Oral, resp. rate 18, weight 132 lb 11.2 oz (60.192 kg), SpO2 98 %.   Resp: Clear bilaterally Cardio: Regular rate and rhythm GI: No hepatomegaly, no mass, nontender Vascular: No leg edema Skin: Biopsy site at the right upper abdomen without evidence of infection or bleeding     Lab Results:  Lab Results  Component Value Date   WBC 7.6 09/09/2015   HGB 11.6 09/09/2015   HCT 35.4 09/09/2015   MCV 85.9 09/09/2015   PLT 201 09/09/2015   NEUTROABS 5.3 09/09/2015    Imaging:  Ct Chest W Contrast  09/10/2015  CLINICAL DATA:  65 year old female with epigastric pain for the past 2 months. Fatigue in weakness. Nausea. New diagnosis of pancreatic cancer. EXAM: CT CHEST, ABDOMEN, AND PELVIS WITH CONTRAST TECHNIQUE: Multidetector CT imaging of the chest, abdomen and pelvis was performed following the standard protocol during bolus administration of intravenous contrast. CONTRAST:  129m OMNIPAQUE IOHEXOL 300 MG/ML  SOLN COMPARISON:  MRI of the abdomen 08/27/2015. FINDINGS: CT CHEST FINDINGS Mediastinum/Lymph Nodes: Heart size is normal. There is no significant pericardial fluid, thickening or pericardial calcification. Borderline enlarged mediastinal and  hilar lymph nodes are nonspecific, measuring up to 9 mm in short axis. Esophagus is unremarkable in appearance. No axillary lymphadenopathy. Lungs/Pleura: Focal areas of architectural distortion with mild cylindrical bronchiectasis in the inferior segment of the lingula and medial segment of the right middle lobe, most compatible with areas of chronic post infectious or inflammatory scarring. No acute consolidative airspace disease. No pleural effusions. Innumerable small pulmonary nodules scattered throughout the lungs bilaterally, the majority of which are 2-3 mm in size. Musculoskeletal/Soft Tissues: There are no aggressive appearing lytic or blastic lesions noted in the visualized portions of the skeleton. Small amount of gas in the subcutaneous fat of the lower right chest wall (image 48 of series 2). CT ABDOMEN AND PELVIS FINDINGS Hepatobiliary: Multiple hypovascular hepatic lesions are again noted, similar in number, but generally increased in size compared to the prior examination, including the largest index lesion at the junction of segments 7 and 8 in the right lobe of the liver (image 55 of series 2), which currently measures 2.2 x 2.5 cm (previously 2.1 x 2.0 cm). No definite new hepatic lesions. No intra or extrahepatic biliary ductal dilatation. Gallbladder is normal in appearance. Pancreas: Again noted is a large hypovascular mass in the distal body and tail of the pancreas, which currently measures approximately 3.2 x 6.2 x 3.1 cm (images 59 of series 2 and coronal image 64 of series 602). There is surrounding soft tissue stranding, presumably reflective of local tumoral invasion and surrounding inflammatory changes. This extends toward the left splenic hilum, without definite evidence of direct invasion. There is also abnormal soft tissue which extends from the mass into the adjacent portions of the retroperitoneum,  presumably a combination of direct extension and adjacent lymphadenopathy. This  soft tissue is encasing the celiac axis, proximal right common hepatic artery and splenic artery, and comes in close proximity to the superior mesenteric artery without definite direct invasion. This mass also completely occludes the splenic vein, but is separate from the superior mesenteric vein, splenoportal confluence and portal vein at this time. Spleen: Unremarkable. Adrenals/Urinary Tract: Bilateral adrenal glands and bilateral kidneys are normal in appearance. No hydroureteronephrosis. Urinary bladder is normal in appearance. Stomach/Bowel: Normal appearance of the stomach. No pathologic dilatation of small bowel or colon. Numerous colonic diverticulae are noted, without surrounding inflammatory changes to suggest an acute diverticulitis at this time. Normal appendix. Vascular/Lymphatic: Atherosclerosis throughout the abdominal and pelvic vasculature, without evidence of aneurysm or dissection. Vascular findings pertinent to pancreatic lesion, as detailed above. In addition to the abnormal retroperitoneal soft tissue throughout the celiac axis which presumably in part represents malignant lymphadenopathy, there are also enlarged lymph nodes in the gastrohepatic ligament, measuring up to 10 mm in short axis. Reproductive: 13 mm avidly enhancing lesion in the uterine fundus, presumably represents a small fibroid. Ovaries are unremarkable in appearance. Other: Trace volume of ascites.  No pneumoperitoneum. Musculoskeletal: There are no aggressive appearing lytic or blastic lesions noted in the visualized portions of the skeleton. IMPRESSION: 1. Interval progression of disease, as evidenced by slight interval enlargement of the mass in the distal body and tail of the pancreas, increasing surrounding lymphadenopathy and vascular involvement (detailed above) including complete occlusion of the splenic vein and encasement of the celiac axis, splenic artery and proximal common hepatic artery, increasing gastrohepatic  ligament lymphadenopathy, and slight interval increase in size of numerous hepatic metastases. 2. In addition, there are also innumerable tiny pulmonary nodules in the lungs, which are nonspecific (generally only 2-3 mm in size). Attention on follow-up studies to exclude pulmonary metastasis is recommended. 3. Small volume of ascites. 4. Small amount of gas in the subcutaneous fat of the lower right anterior chest wall (image 48 of series 2). This is of uncertain etiology and significance, but could be iatrogenic patient has received to recent subcutaneous injection in this region. 5. Additional incidental findings, as above. Electronically Signed   By: Vinnie Langton M.D.   On: 09/10/2015 08:36   Ct Abdomen Pelvis W Contrast  09/10/2015  CLINICAL DATA:  65 year old female with epigastric pain for the past 2 months. Fatigue in weakness. Nausea. New diagnosis of pancreatic cancer. EXAM: CT CHEST, ABDOMEN, AND PELVIS WITH CONTRAST TECHNIQUE: Multidetector CT imaging of the chest, abdomen and pelvis was performed following the standard protocol during bolus administration of intravenous contrast. CONTRAST:  175m OMNIPAQUE IOHEXOL 300 MG/ML  SOLN COMPARISON:  MRI of the abdomen 08/27/2015. FINDINGS: CT CHEST FINDINGS Mediastinum/Lymph Nodes: Heart size is normal. There is no significant pericardial fluid, thickening or pericardial calcification. Borderline enlarged mediastinal and hilar lymph nodes are nonspecific, measuring up to 9 mm in short axis. Esophagus is unremarkable in appearance. No axillary lymphadenopathy. Lungs/Pleura: Focal areas of architectural distortion with mild cylindrical bronchiectasis in the inferior segment of the lingula and medial segment of the right middle lobe, most compatible with areas of chronic post infectious or inflammatory scarring. No acute consolidative airspace disease. No pleural effusions. Innumerable small pulmonary nodules scattered throughout the lungs bilaterally, the  majority of which are 2-3 mm in size. Musculoskeletal/Soft Tissues: There are no aggressive appearing lytic or blastic lesions noted in the visualized portions of the skeleton. Small amount of gas  in the subcutaneous fat of the lower right chest wall (image 48 of series 2). CT ABDOMEN AND PELVIS FINDINGS Hepatobiliary: Multiple hypovascular hepatic lesions are again noted, similar in number, but generally increased in size compared to the prior examination, including the largest index lesion at the junction of segments 7 and 8 in the right lobe of the liver (image 55 of series 2), which currently measures 2.2 x 2.5 cm (previously 2.1 x 2.0 cm). No definite new hepatic lesions. No intra or extrahepatic biliary ductal dilatation. Gallbladder is normal in appearance. Pancreas: Again noted is a large hypovascular mass in the distal body and tail of the pancreas, which currently measures approximately 3.2 x 6.2 x 3.1 cm (images 59 of series 2 and coronal image 64 of series 602). There is surrounding soft tissue stranding, presumably reflective of local tumoral invasion and surrounding inflammatory changes. This extends toward the left splenic hilum, without definite evidence of direct invasion. There is also abnormal soft tissue which extends from the mass into the adjacent portions of the retroperitoneum, presumably a combination of direct extension and adjacent lymphadenopathy. This soft tissue is encasing the celiac axis, proximal right common hepatic artery and splenic artery, and comes in close proximity to the superior mesenteric artery without definite direct invasion. This mass also completely occludes the splenic vein, but is separate from the superior mesenteric vein, splenoportal confluence and portal vein at this time. Spleen: Unremarkable. Adrenals/Urinary Tract: Bilateral adrenal glands and bilateral kidneys are normal in appearance. No hydroureteronephrosis. Urinary bladder is normal in appearance.  Stomach/Bowel: Normal appearance of the stomach. No pathologic dilatation of small bowel or colon. Numerous colonic diverticulae are noted, without surrounding inflammatory changes to suggest an acute diverticulitis at this time. Normal appendix. Vascular/Lymphatic: Atherosclerosis throughout the abdominal and pelvic vasculature, without evidence of aneurysm or dissection. Vascular findings pertinent to pancreatic lesion, as detailed above. In addition to the abnormal retroperitoneal soft tissue throughout the celiac axis which presumably in part represents malignant lymphadenopathy, there are also enlarged lymph nodes in the gastrohepatic ligament, measuring up to 10 mm in short axis. Reproductive: 13 mm avidly enhancing lesion in the uterine fundus, presumably represents a small fibroid. Ovaries are unremarkable in appearance. Other: Trace volume of ascites.  No pneumoperitoneum. Musculoskeletal: There are no aggressive appearing lytic or blastic lesions noted in the visualized portions of the skeleton. IMPRESSION: 1. Interval progression of disease, as evidenced by slight interval enlargement of the mass in the distal body and tail of the pancreas, increasing surrounding lymphadenopathy and vascular involvement (detailed above) including complete occlusion of the splenic vein and encasement of the celiac axis, splenic artery and proximal common hepatic artery, increasing gastrohepatic ligament lymphadenopathy, and slight interval increase in size of numerous hepatic metastases. 2. In addition, there are also innumerable tiny pulmonary nodules in the lungs, which are nonspecific (generally only 2-3 mm in size). Attention on follow-up studies to exclude pulmonary metastasis is recommended. 3. Small volume of ascites. 4. Small amount of gas in the subcutaneous fat of the lower right anterior chest wall (image 48 of series 2). This is of uncertain etiology and significance, but could be iatrogenic patient has  received to recent subcutaneous injection in this region. 5. Additional incidental findings, as above. Electronically Signed   By: Vinnie Langton M.D.   On: 09/10/2015 08:36   US Biopsy  09/10/2015  CLINICAL DATA:  Liver lesions.  Pancreatic mass. EXAM: ULTRASOUND-GUIDED BIOPSY OF A RIGHT LOBE LIVER LESION.  CORE. MEDICATIONS AND  MEDICAL HISTORY: Versed Three mg, Fentanyl 50 mcg. Additional Medications: Non. ANESTHESIA/SEDATION: Moderate sedation time: 9 minutes PROCEDURE: The procedure, risks, benefits, and alternatives were explained to the patient. Questions regarding the procedure were encouraged and answered. The patient understands and consents to the procedure. The right flank was prepped with Betadine in a sterile fashion, and a sterile drape was applied covering the operative field. A sterile gown and sterile gloves were used for the procedure. Under sonographic guidance, an 17 gauge guide needle was advanced into the right lobe liver lesion. Subsequently four 18 gauge core biopsies were obtained. Gel-Foam slurry was injected into the needle tract. The guide needle was removed. Final imaging was performed. Patient tolerated the procedure well without complication. Vital sign monitoring by nursing staff during the procedure will continue as patient is in the special procedures unit for post procedure observation. FINDINGS: The images document guide needle placement within the right lobe liver lesion. Post biopsy images demonstrate no hemorrhage. COMPLICATIONS: None. IMPRESSION: Successful ultrasound-guided core biopsy of a right lobe liver lesion. Electronically Signed   By: Marybelle Killings M.D.   On: 09/10/2015 08:12    Medications: I have reviewed the patient's current medications.  Assessment/Plan: 1. Pancreas cancer  MRI abdomen 08/27/2015 revealed a pancreas mass, adenopathy, and liver metastases  CTs 09/10/2015 with pancreas body/tail mass, liver metastases, lymphadenopathy, innumerable  tiny pulmonary nodules  Ultrasound-guided biopsy of a right liver lesion 09/10/2015 confirmed adenocarcinoma  2. Pain secondary to #1  3. Constipation-likely secondary to narcotics   Disposition:  Wendy Palmer has been diagnosed with metastatic pancreas cancer. I discussed treatment options with her again today. She understands no therapy will be curative. I recommend treatment with systemic chemotherapy. We discussed standard treatment with gemcitabine/Abraxane and FOLFIRINOX. She appears to be a good candidate for FOLFIRINOX chemotherapy. She met with a research nurse today and was offered enrollment on the SWOG S1313 study which randomizes patients to FOLFIRINOX vs FOLFIRINOX/ PEG hyaluronidase.  She would like to proceed with FOLFIRINOX chemotherapy and is interested in enrolling on the SWOG study. I reviewed the potential toxicities associated with the FOLFIRINOX regimen including the chance for nausea/vomiting, mucositis, diarrhea, alopecia, and hematologic toxicity. We discussed the hyperpigmentation, some sensitivity, rash, and hand/foot syndrome associated with 5-fluorouracil. We reviewed the allergic reaction various types of neuropathy seen with oxaliplatin. We discussed the diarrhea and abdominal pain seen with irinotecan.  Wendy Palmer will attend a chemotherapy teaching class today.  She is scheduled for placement of a Port-A-Cath on 09/13/2015.  A first cycle of chemotherapy is scheduled for 09/18/2015. She understands she will be prescribed prophylactic Lovenox if she is randomized to the experimental arm on the clinical trial.  She will return for an office visit and cycle 2 chemotherapy on 10/02/2015.  We will see her in the interim as needed.   Her ECOG performance status is 1 today.  Approximately 40 minutes were spent with the patient today. The majority of the time was used for counseling and coordination of care.  Betsy Coder, MD  09/11/2015  1:08 PM

## 2015-09-11 NOTE — Telephone Encounter (Signed)
Called patient with appointments and i have sent Wendy Palmer and email to help with 1/18 chemo

## 2015-09-12 ENCOUNTER — Other Ambulatory Visit: Payer: Self-pay | Admitting: Radiology

## 2015-09-12 ENCOUNTER — Encounter: Payer: Self-pay | Admitting: *Deleted

## 2015-09-12 ENCOUNTER — Telehealth: Payer: Self-pay | Admitting: Oncology

## 2015-09-12 ENCOUNTER — Telehealth: Payer: Self-pay | Admitting: *Deleted

## 2015-09-12 NOTE — Telephone Encounter (Signed)
[id number]: Return call from Roland granting permission to send her records to [facility] as her daughter requested. Expressed "I feel like a traitor". Reassured her this is a very reasonable thing to do-this is a serious disease and she needs to do what feels right to her. Expressed appreciation for understanding.

## 2015-09-12 NOTE — Telephone Encounter (Signed)
Faxed records requested for second opinion to [facility] [id number] release id

## 2015-09-12 NOTE — Telephone Encounter (Signed)
Per staff message and POF I have scheduled appts. Advised scheduler of appts. JMW  

## 2015-09-12 NOTE — Telephone Encounter (Signed)
Oncology Nurse Navigator Documentation  Oncology Nurse Navigator Flowsheets 09/12/2015  Navigator Location [facility]  Navigator Encounter Type Telephone  Telephone Incoming Call;Outgoing Call  Abnormal Finding Date 08/27/2015  Confirmed Diagnosis Date 09/09/2015  Patient Visit Type -  Treatment Phase -  Barriers/Navigation Needs Coordination of Care--records to [facility] for 2nd opinion  Education -  Interventions Coordination of Care-notified radiology to [facility] scans on CD and HIM to send records  Referrals -  Coordination of Care Appts--Family has initiated the consult and is making the appointment  Education Method -  Support Groups/Services -  Acuity Level 2  Time Spent with Patient 30  Call on 1/11 from daughter, Esmond Camper requesting records/CD be sent to [facility] att: Mliss Sax for 2nd opinion. Contact [phone] and fax [phone] [facility]:[address]  [place] [address] Called and left VM for patient at 0800 today: Requesting she return call to navigator to confirm she is on board with her medical information being sent to [facility], since she was not the one that called.

## 2015-09-13 ENCOUNTER — Ambulatory Visit (HOSPITAL_COMMUNITY)
Admission: RE | Admit: 2015-09-13 | Discharge: 2015-09-13 | Disposition: A | Discharge status: Home / Self Care | Payer: PPO | Source: Ambulatory Visit | Attending: Oncology | Admitting: Oncology

## 2015-09-13 ENCOUNTER — Other Ambulatory Visit: Payer: Self-pay | Admitting: Oncology

## 2015-09-13 ENCOUNTER — Telehealth: Payer: Self-pay | Admitting: *Deleted

## 2015-09-13 ENCOUNTER — Other Ambulatory Visit: Payer: Self-pay | Admitting: *Deleted

## 2015-09-13 ENCOUNTER — Encounter: Payer: PPO | Admitting: *Deleted

## 2015-09-13 ENCOUNTER — Encounter: Payer: Self-pay | Admitting: *Deleted

## 2015-09-13 DIAGNOSIS — C252 Malignant neoplasm of tail of pancreas: Secondary | ICD-10-CM

## 2015-09-13 DIAGNOSIS — C787 Secondary malignant neoplasm of liver and intrahepatic bile duct: Secondary | ICD-10-CM | POA: Insufficient documentation

## 2015-09-13 DIAGNOSIS — Z7982 Long term (current) use of aspirin: Secondary | ICD-10-CM | POA: Diagnosis not present

## 2015-09-13 DIAGNOSIS — C259 Malignant neoplasm of pancreas, unspecified: Secondary | ICD-10-CM | POA: Diagnosis not present

## 2015-09-13 LAB — PROTIME-INR
INR: 1 (ref 0.00–1.49)
Prothrombin Time: 13.4 seconds (ref 11.6–15.2)

## 2015-09-13 LAB — BASIC METABOLIC PANEL
Anion gap: 5 (ref 5–15)
BUN: 18 mg/dL (ref 6–20)
CO2: 28 mmol/L (ref 22–32)
CREATININE: 0.63 mg/dL (ref 0.44–1.00)
Calcium: 9.2 mg/dL (ref 8.9–10.3)
Chloride: 104 mmol/L (ref 101–111)
GFR calc Af Amer: >60 mL/min (ref >60)
Glucose, Bld: 123 mg/dL — ABNORMAL HIGH (ref 65–99)
Potassium: 4.4 mmol/L (ref 3.5–5.1)
SODIUM: 137 mmol/L (ref 135–145)

## 2015-09-13 LAB — CBC
HCT: 34.5 % — ABNORMAL LOW (ref 36.0–46.0)
Hemoglobin: 11 g/dL — ABNORMAL LOW (ref 12.0–15.0)
MCH: 27.8 pg (ref 26.0–34.0)
MCHC: 31.9 g/dL (ref 30.0–36.0)
MCV: 87.3 fL (ref 78.0–100.0)
PLATELETS: 221 10*3/uL (ref 150–400)
RBC: 3.95 MIL/uL (ref 3.87–5.11)
RDW: 13.8 % (ref 11.5–15.5)
WBC: 5.8 10*3/uL (ref 4.0–10.5)

## 2015-09-13 LAB — APTT: APTT: 38 s — AB (ref 24–37)

## 2015-09-13 MED ORDER — FENTANYL CITRATE (PF) 100 MCG/2ML IJ SOLN
INTRAMUSCULAR | Status: AC
  Filled 2015-09-13: qty 4

## 2015-09-13 MED ORDER — LIDOCAINE HCL 1 % IJ SOLN
INTRAMUSCULAR | Status: AC
  Filled 2015-09-13: qty 20

## 2015-09-13 MED ORDER — CEFAZOLIN SODIUM-DEXTROSE 2-3 GM-% IV SOLR
2.0000 g | INTRAVENOUS | Status: AC
  Administered 2015-09-13: 2 g via INTRAVENOUS

## 2015-09-13 MED ORDER — CEFAZOLIN SODIUM-DEXTROSE 2-3 GM-% IV SOLR
INTRAVENOUS | Status: AC
  Administered 2015-09-13: 2 g via INTRAVENOUS
  Filled 2015-09-13: qty 50

## 2015-09-13 MED ORDER — MIDAZOLAM HCL 2 MG/2ML IJ SOLN
INTRAMUSCULAR | Status: AC | PRN
  Administered 2015-09-13: 1 mg via INTRAVENOUS
  Administered 2015-09-13: 0.5 mg via INTRAVENOUS
  Administered 2015-09-13: 1 mg via INTRAVENOUS

## 2015-09-13 MED ORDER — HEPARIN SOD (PORK) LOCK FLUSH 100 UNIT/ML IV SOLN
INTRAVENOUS | Status: DC | PRN
  Administered 2015-09-13: 500 [IU]

## 2015-09-13 MED ORDER — MIDAZOLAM HCL 2 MG/2ML IJ SOLN
INTRAMUSCULAR | Status: AC
  Filled 2015-09-13: qty 6

## 2015-09-13 MED ORDER — SODIUM CHLORIDE 0.9 % IV SOLN: Freq: Once | INTRAVENOUS | Status: DC

## 2015-09-13 MED ORDER — HEPARIN SOD (PORK) LOCK FLUSH 100 UNIT/ML IV SOLN
INTRAVENOUS | Status: AC
  Filled 2015-09-13: qty 5

## 2015-09-13 MED ORDER — HYDROMORPHONE HCL 4 MG PO TABS: 4.0000 mg | ORAL_TABLET | ORAL | Status: DC | PRN

## 2015-09-13 MED ORDER — FENTANYL CITRATE (PF) 100 MCG/2ML IJ SOLN
INTRAMUSCULAR | Status: AC | PRN
  Administered 2015-09-13: 50 ug via INTRAVENOUS

## 2015-09-13 MED ORDER — LIDOCAINE-EPINEPHRINE 2 %-1:100000 IJ SOLN
INTRAMUSCULAR | Status: AC
  Filled 2015-09-13: qty 1

## 2015-09-13 NOTE — Procedures (Signed)
Successful RT IJ POWER PORT Tip svc/ra No comp Stable Ready for use EBL 0

## 2015-09-13 NOTE — Telephone Encounter (Signed)
Per desk RN and POf I have rescheduled appts. Gina to call patient with appts

## 2015-09-13 NOTE — Telephone Encounter (Signed)
Call from pt's family member reporting Oxycodone has not been effective for her pain. Rates substernal and back pain at 4/5 after taking 2 Oxycodone. Requesting a different medication. Reviewed with Dr. Benay Spice: Order received for Dilaudid 4 mg Q 4hours PRN. Rx will be left in prescription book for pick up. Pt is having port placed today. Dilaudid instructions reviewed with Shanon Brow, he understands this is a lot stronger than oxycodone. She is to begin with one tab Q 4 hours, will titrate up or down PRN.

## 2015-09-13 NOTE — Telephone Encounter (Signed)
Call from [facility], spoke with Margaretha Sheffield: Requesting clarification for Zofran approval. Info provided. Zofran will be approved, Margaretha Sheffield will notify pharmacy.

## 2015-09-13 NOTE — Discharge Instructions (Signed)
Implanted Port Home Guide °An implanted port is a type of central line that is placed under the skin. Central lines are used to provide IV access when treatment or nutrition needs to be given through a person's veins. Implanted ports are used for long-term IV access. An implanted port may be placed because:  °· You need IV medicine that would be irritating to the small veins in your hands or arms.   °· You need long-term IV medicines, such as antibiotics.   °· You need IV nutrition for a long period.   °· You need frequent blood draws for lab tests.   °· You need dialysis.   °Implanted ports are usually placed in the chest area, but they can also be placed in the upper arm, the abdomen, or the leg. An implanted port has two main parts:  °· Reservoir. The reservoir is round and will appear as a small, raised area under your skin. The reservoir is the part where a needle is inserted to give medicines or draw blood.   °· Catheter. The catheter is a thin, flexible tube that extends from the reservoir. The catheter is placed into a large vein. Medicine that is inserted into the reservoir goes into the catheter and then into the vein.   °HOW WILL I CARE FOR MY INCISION SITE? °Do not get the incision site wet. Bathe or shower as directed by your health care provider.  °HOW IS MY PORT ACCESSED? °Special steps must be taken to access the port:  °· Before the port is accessed, a numbing cream can be placed on the skin. This helps numb the skin over the port site.   °· Your health care provider uses a sterile technique to access the port. °· Your health care provider must put on a mask and sterile gloves. °· The skin over your port is cleaned carefully with an antiseptic and allowed to dry. °· The port is gently pinched between sterile gloves, and a needle is inserted into the port. °· Only "non-coring" port needles should be used to access the port. Once the port is accessed, a blood return should be checked. This helps  ensure that the port is in the vein and is not clogged.   °· If your port needs to remain accessed for a constant infusion, a clear (transparent) bandage will be placed over the needle site. The bandage and needle will need to be changed every week, or as directed by your health care provider.   °· Keep the bandage covering the needle clean and dry. Do not get it wet. Follow your health care provider's instructions on how to take a shower or bath while the port is accessed.   °· If your port does not need to stay accessed, no bandage is needed over the port.   °WHAT IS FLUSHING? °Flushing helps keep the port from getting clogged. Follow your health care provider's instructions on how and when to flush the port. Ports are usually flushed with saline solution or a medicine called heparin. The need for flushing will depend on how the port is used.  °· If the port is used for intermittent medicines or blood draws, the port will need to be flushed:   °· After medicines have been given.   °· After blood has been drawn.   °· As part of routine maintenance.   °· If a constant infusion is running, the port may not need to be flushed.   °HOW LONG WILL MY PORT STAY IMPLANTED? °The port can stay in for as long as your health care   provider thinks it is needed. When it is time for the port to come out, surgery will be done to remove it. The procedure is similar to the one performed when the port was put in.  °WHEN SHOULD I SEEK IMMEDIATE MEDICAL CARE? °When you have an implanted port, you should seek immediate medical care if:  °· You notice a bad smell coming from the incision site.   °· You have swelling, redness, or drainage at the incision site.   °· You have more swelling or pain at the port site or the surrounding area.   °· You have a fever that is not controlled with medicine. °  °This information is not intended to replace advice given to you by your health care provider. Make sure you discuss any questions you have with  your health care provider. °  °Document Released: 08/17/2005 Document Revised: 06/07/2013 Document Reviewed: 04/24/2013 °[employer] Interactive Patient Education ©2016 [employer] °Implanted Port Insertion, Care After °Refer to this sheet in the next few weeks. These instructions provide you with information on caring for yourself after your procedure. Your health care provider may also give you more specific instructions. Your treatment has been planned according to current medical practices, but problems sometimes occur. Call your health care provider if you have any problems or questions after your procedure. °WHAT TO EXPECT AFTER THE PROCEDURE °After your procedure, it is typical to have the following:  °· Discomfort at the port insertion site. Ice packs to the area will help. °· Bruising on the skin over the port. This will subside in 3-4 days. °HOME CARE INSTRUCTIONS °· After your port is placed, you will get a manufacturer's information card. The card has information about your port. Keep this card with you at all times.   °· Know what kind of port you have. There are many types of ports available.   °· Wear a medical alert bracelet in case of an emergency. This can help alert health care workers that you have a port.   °· The port can stay in for as long as your health care provider believes it is necessary.   °· A home health care nurse may give medicines and take care of the port.   °· You or a family member can get special training and directions for giving medicine and taking care of the port at home.   °SEEK MEDICAL CARE IF:  °· Your port does not flush or you are unable to get a blood return.   °· You have a fever or chills. °SEEK IMMEDIATE MEDICAL CARE IF: °· You have new fluid or pus coming from your incision.   °· You notice a bad smell coming from your incision site.   °· You have swelling, pain, or more redness at the incision or port site.   °· You have chest pain or shortness of breath. °  °This  information is not intended to replace advice given to you by your health care provider. Make sure you discuss any questions you have with your health care provider. °  °Document Released: 06/07/2013 Document Revised: 08/22/2013 Document Reviewed: 06/07/2013 °[employer] Interactive Patient Education ©2016 [employer] °Moderate Conscious Sedation, Adult, Care After °Refer to this sheet in the next few weeks. These instructions provide you with information on caring for yourself after your procedure. Your health care provider may also give you more specific instructions. Your treatment has been planned according to current medical practices, but problems sometimes occur. Call your health care provider if you have any problems or questions after your   procedure. °WHAT TO EXPECT AFTER THE PROCEDURE  °After your procedure: °· You may feel sleepy, clumsy, and have poor balance for several hours. °· Vomiting may occur if you eat too soon after the procedure. °HOME CARE INSTRUCTIONS °· Do not participate in any activities where you could become injured for at least 24 hours. Do not: °¨ Drive. °¨ Swim. °¨ Ride a bicycle. °¨ Operate heavy machinery. °¨ Cook. °¨ Use power tools. °¨ Climb ladders. °¨ Work from a high place. °· Do not make important decisions or sign legal documents until you are improved. °· If you vomit, drink water, juice, or soup when you can drink without vomiting. Make sure you have little or no nausea before eating solid foods. °· Only take over-the-counter or prescription medicines for pain, discomfort, or fever as directed by your health care provider. °· Make sure you and your family fully understand everything about the medicines given to you, including what side effects may occur. °· You should not drink alcohol, take sleeping pills, or take medicines that cause drowsiness for at least 24 hours. °· If you smoke, do not smoke without supervision. °· If you are feeling better, you may resume normal  activities 24 hours after you were sedated. °· Keep all appointments with your health care provider. °SEEK MEDICAL CARE IF: °· Your skin is pale or bluish in color. °· You continue to feel nauseous or vomit. °· Your pain is getting worse and is not helped by medicine. °· You have bleeding or swelling. °· You are still sleepy or feeling clumsy after 24 hours. °SEEK IMMEDIATE MEDICAL CARE IF: °· You develop a rash. °· You have difficulty breathing. °· You develop any type of allergic problem. °· You have a fever. °MAKE SURE YOU: °· Understand these instructions. °· Will watch your condition. °· Will get help right away if you are not doing well or get worse. °  °This information is not intended to replace advice given to you by your health care provider. Make sure you discuss any questions you have with your health care provider. °  °Document Released: 06/07/2013 Document Revised: 09/07/2014 Document Reviewed: 06/07/2013 °[employer] Interactive Patient Education ©2016 [employer] ° °

## 2015-09-13 NOTE — Progress Notes (Signed)
Patient ID: Wendy Palmer, female   DOB: 29-Sep-1950, 65 y.o.   MRN: [id number]    Referring Physician(s): Betsy Coder B  Chief Complaint:  "I'm here for a port a cath"  Subjective: Patient familiar to IR service from recent liver lesion biopsy on 09/09/2015. She has known metastatic pancreatic cancer and presents today for Port-A-Cath placement for chemotherapy. She currently denies fevers, chills, headache, chest pain, dyspnea, cough, nausea, vomiting or abnormal bleeding. She does have intermittent abdominal and back pain.   Allergies: Macrobid and Ace inhibitors  Medications: Prior to Admission medications   Medication Sig Start Date End Date Taking? Authorizing Provider  amLODipine (NORVASC) 2.5 MG tablet TAKE 1 TABLET (2.5 MG TOTAL) BY MOUTH DAILY. 07/09/15   Chipper Herb, MD  aspirin 81 MG tablet Take 81 mg by mouth daily. Reported on 09/11/2015    Historical Provider, MD  calcium carbonate (TUMS - DOSED IN MG ELEMENTAL CALCIUM) 500 MG chewable tablet Chew 1 tablet by mouth daily as needed for indigestion or heartburn.    Historical Provider, MD  Cholecalciferol (VITAMIN D) 1000 UNITS capsule Take 1,000 Units by mouth daily.    Historical Provider, MD  Cyanocobalamin (B-12 PO) Take by mouth daily.    Historical Provider, MD  docusate sodium (COLACE) 100 MG capsule Take 100 mg by mouth 2 (two) times daily. 09/05/15   Ladell Pier, MD  famotidine (PEPCID) 20 MG tablet One at bedtime 08/19/15   Tanda Rockers, MD  HYDROcodone-acetaminophen (NORCO/VICODIN) 5-325 MG tablet Take 1 tablet 4 times daily as needed 08/30/15   Mary-Margaret Hassell Done, FNP  imipramine (TOFRANIL) 50 MG tablet Take 2 tablets (100 mg total) by mouth at bedtime. 07/09/15   Chipper Herb, MD  LORazepam (ATIVAN) 0.5 MG tablet Take 1 tablet (0.5 mg total) by mouth 3 (three) times daily as needed for anxiety. 08/28/15   Chipper Herb, MD  Multiple Vitamin (MULTIVITAMIN) tablet Take 1 tablet by mouth daily. Reported on  09/11/2015    Historical Provider, MD  ondansetron (ZOFRAN) 8 MG tablet Take 1 tablet (8 mg total) by mouth every 8 (eight) hours as needed for nausea or vomiting. 09/11/15   Ladell Pier, MD  pantoprazole (PROTONIX) 40 MG tablet Take 1 tablet (40 mg total) by mouth daily. Take 30-60 min before first meal of the day Patient not taking: Reported on 09/11/2015 08/19/15   Tanda Rockers, MD  polyethylene glycol [facility] / Floria Raveling) packet Take 17 g by mouth daily. 09/05/15   Ladell Pier, MD     Vital Signs: BP 134/61 mmHg  Pulse 88  Temp(Src) 98 F (36.7 C) (Oral)  Resp 16  SpO2 99%  Physical Exam patient awake, alert. Chest clear to auscultation bilaterally. Heart with regular rate and rhythm. Abdomen soft, positive bowel sounds, nontender at this time to palpation, extremities with no edema  Imaging: Ct Chest W Contrast  09/10/2015  CLINICAL DATA:  65 year old female with epigastric pain for the past 2 months. Fatigue in weakness. Nausea. New diagnosis of pancreatic cancer. EXAM: CT CHEST, ABDOMEN, AND PELVIS WITH CONTRAST TECHNIQUE: Multidetector CT imaging of the chest, abdomen and pelvis was performed following the standard protocol during bolus administration of intravenous contrast. CONTRAST:  171mL OMNIPAQUE IOHEXOL 300 MG/ML  SOLN COMPARISON:  MRI of the abdomen 08/27/2015. FINDINGS: CT CHEST FINDINGS Mediastinum/Lymph Nodes: Heart size is normal. There is no significant pericardial fluid, thickening or pericardial calcification. Borderline enlarged mediastinal and hilar lymph nodes are nonspecific, measuring up  to 9 mm in short axis. Esophagus is unremarkable in appearance. No axillary lymphadenopathy. Lungs/Pleura: Focal areas of architectural distortion with mild cylindrical bronchiectasis in the inferior segment of the lingula and medial segment of the right middle lobe, most compatible with areas of chronic post infectious or inflammatory scarring. No acute consolidative airspace  disease. No pleural effusions. Innumerable small pulmonary nodules scattered throughout the lungs bilaterally, the majority of which are 2-3 mm in size. Musculoskeletal/Soft Tissues: There are no aggressive appearing lytic or blastic lesions noted in the visualized portions of the skeleton. Small amount of gas in the subcutaneous fat of the lower right chest wall (image 48 of series 2). CT ABDOMEN AND PELVIS FINDINGS Hepatobiliary: Multiple hypovascular hepatic lesions are again noted, similar in number, but generally increased in size compared to the prior examination, including the largest index lesion at the junction of segments 7 and 8 in the right lobe of the liver (image 55 of series 2), which currently measures 2.2 x 2.5 cm (previously 2.1 x 2.0 cm). No definite new hepatic lesions. No intra or extrahepatic biliary ductal dilatation. Gallbladder is normal in appearance. Pancreas: Again noted is a large hypovascular mass in the distal body and tail of the pancreas, which currently measures approximately 3.2 x 6.2 x 3.1 cm (images 59 of series 2 and coronal image 64 of series 602). There is surrounding soft tissue stranding, presumably reflective of local tumoral invasion and surrounding inflammatory changes. This extends toward the left splenic hilum, without definite evidence of direct invasion. There is also abnormal soft tissue which extends from the mass into the adjacent portions of the retroperitoneum, presumably a combination of direct extension and adjacent lymphadenopathy. This soft tissue is encasing the celiac axis, proximal right common hepatic artery and splenic artery, and comes in close proximity to the superior mesenteric artery without definite direct invasion. This mass also completely occludes the splenic vein, but is separate from the superior mesenteric vein, splenoportal confluence and portal vein at this time. Spleen: Unremarkable. Adrenals/Urinary Tract: Bilateral adrenal glands and  bilateral kidneys are normal in appearance. No hydroureteronephrosis. Urinary bladder is normal in appearance. Stomach/Bowel: Normal appearance of the stomach. No pathologic dilatation of small bowel or colon. Numerous colonic diverticulae are noted, without surrounding inflammatory changes to suggest an acute diverticulitis at this time. Normal appendix. Vascular/Lymphatic: Atherosclerosis throughout the abdominal and pelvic vasculature, without evidence of aneurysm or dissection. Vascular findings pertinent to pancreatic lesion, as detailed above. In addition to the abnormal retroperitoneal soft tissue throughout the celiac axis which presumably in part represents malignant lymphadenopathy, there are also enlarged lymph nodes in the gastrohepatic ligament, measuring up to 10 mm in short axis. Reproductive: 13 mm avidly enhancing lesion in the uterine fundus, presumably represents a small fibroid. Ovaries are unremarkable in appearance. Other: Trace volume of ascites.  No pneumoperitoneum. Musculoskeletal: There are no aggressive appearing lytic or blastic lesions noted in the visualized portions of the skeleton. IMPRESSION: 1. Interval progression of disease, as evidenced by slight interval enlargement of the mass in the distal body and tail of the pancreas, increasing surrounding lymphadenopathy and vascular involvement (detailed above) including complete occlusion of the splenic vein and encasement of the celiac axis, splenic artery and proximal common hepatic artery, increasing gastrohepatic ligament lymphadenopathy, and slight interval increase in size of numerous hepatic metastases. 2. In addition, there are also innumerable tiny pulmonary nodules in the lungs, which are nonspecific (generally only 2-3 mm in size). Attention on follow-up studies to exclude  pulmonary metastasis is recommended. 3. Small volume of ascites. 4. Small amount of gas in the subcutaneous fat of the lower right anterior chest wall  (image 48 of series 2). This is of uncertain etiology and significance, but could be iatrogenic patient has received to recent subcutaneous injection in this region. 5. Additional incidental findings, as above. Electronically Signed   By: Vinnie Langton M.D.   On: 09/10/2015 08:36   Ct Abdomen Pelvis W Contrast  09/10/2015  CLINICAL DATA:  65 year old female with epigastric pain for the past 2 months. Fatigue in weakness. Nausea. New diagnosis of pancreatic cancer. EXAM: CT CHEST, ABDOMEN, AND PELVIS WITH CONTRAST TECHNIQUE: Multidetector CT imaging of the chest, abdomen and pelvis was performed following the standard protocol during bolus administration of intravenous contrast. CONTRAST:  145mL OMNIPAQUE IOHEXOL 300 MG/ML  SOLN COMPARISON:  MRI of the abdomen 08/27/2015. FINDINGS: CT CHEST FINDINGS Mediastinum/Lymph Nodes: Heart size is normal. There is no significant pericardial fluid, thickening or pericardial calcification. Borderline enlarged mediastinal and hilar lymph nodes are nonspecific, measuring up to 9 mm in short axis. Esophagus is unremarkable in appearance. No axillary lymphadenopathy. Lungs/Pleura: Focal areas of architectural distortion with mild cylindrical bronchiectasis in the inferior segment of the lingula and medial segment of the right middle lobe, most compatible with areas of chronic post infectious or inflammatory scarring. No acute consolidative airspace disease. No pleural effusions. Innumerable small pulmonary nodules scattered throughout the lungs bilaterally, the majority of which are 2-3 mm in size. Musculoskeletal/Soft Tissues: There are no aggressive appearing lytic or blastic lesions noted in the visualized portions of the skeleton. Small amount of gas in the subcutaneous fat of the lower right chest wall (image 48 of series 2). CT ABDOMEN AND PELVIS FINDINGS Hepatobiliary: Multiple hypovascular hepatic lesions are again noted, similar in number, but generally increased in  size compared to the prior examination, including the largest index lesion at the junction of segments 7 and 8 in the right lobe of the liver (image 55 of series 2), which currently measures 2.2 x 2.5 cm (previously 2.1 x 2.0 cm). No definite new hepatic lesions. No intra or extrahepatic biliary ductal dilatation. Gallbladder is normal in appearance. Pancreas: Again noted is a large hypovascular mass in the distal body and tail of the pancreas, which currently measures approximately 3.2 x 6.2 x 3.1 cm (images 59 of series 2 and coronal image 64 of series 602). There is surrounding soft tissue stranding, presumably reflective of local tumoral invasion and surrounding inflammatory changes. This extends toward the left splenic hilum, without definite evidence of direct invasion. There is also abnormal soft tissue which extends from the mass into the adjacent portions of the retroperitoneum, presumably a combination of direct extension and adjacent lymphadenopathy. This soft tissue is encasing the celiac axis, proximal right common hepatic artery and splenic artery, and comes in close proximity to the superior mesenteric artery without definite direct invasion. This mass also completely occludes the splenic vein, but is separate from the superior mesenteric vein, splenoportal confluence and portal vein at this time. Spleen: Unremarkable. Adrenals/Urinary Tract: Bilateral adrenal glands and bilateral kidneys are normal in appearance. No hydroureteronephrosis. Urinary bladder is normal in appearance. Stomach/Bowel: Normal appearance of the stomach. No pathologic dilatation of small bowel or colon. Numerous colonic diverticulae are noted, without surrounding inflammatory changes to suggest an acute diverticulitis at this time. Normal appendix. Vascular/Lymphatic: Atherosclerosis throughout the abdominal and pelvic vasculature, without evidence of aneurysm or dissection. Vascular findings pertinent to pancreatic lesion,  as  detailed above. In addition to the abnormal retroperitoneal soft tissue throughout the celiac axis which presumably in part represents malignant lymphadenopathy, there are also enlarged lymph nodes in the gastrohepatic ligament, measuring up to 10 mm in short axis. Reproductive: 13 mm avidly enhancing lesion in the uterine fundus, presumably represents a small fibroid. Ovaries are unremarkable in appearance. Other: Trace volume of ascites.  No pneumoperitoneum. Musculoskeletal: There are no aggressive appearing lytic or blastic lesions noted in the visualized portions of the skeleton. IMPRESSION: 1. Interval progression of disease, as evidenced by slight interval enlargement of the mass in the distal body and tail of the pancreas, increasing surrounding lymphadenopathy and vascular involvement (detailed above) including complete occlusion of the splenic vein and encasement of the celiac axis, splenic artery and proximal common hepatic artery, increasing gastrohepatic ligament lymphadenopathy, and slight interval increase in size of numerous hepatic metastases. 2. In addition, there are also innumerable tiny pulmonary nodules in the lungs, which are nonspecific (generally only 2-3 mm in size). Attention on follow-up studies to exclude pulmonary metastasis is recommended. 3. Small volume of ascites. 4. Small amount of gas in the subcutaneous fat of the lower right anterior chest wall (image 48 of series 2). This is of uncertain etiology and significance, but could be iatrogenic patient has received to recent subcutaneous injection in this region. 5. Additional incidental findings, as above. Electronically Signed   By: Vinnie Langton M.D.   On: 09/10/2015 08:36   US Biopsy  09/10/2015  CLINICAL DATA:  Liver lesions.  Pancreatic mass. EXAM: ULTRASOUND-GUIDED BIOPSY OF A RIGHT LOBE LIVER LESION.  CORE. MEDICATIONS AND MEDICAL HISTORY: Versed Three mg, Fentanyl 50 mcg. Additional Medications: Non.  ANESTHESIA/SEDATION: Moderate sedation time: 9 minutes PROCEDURE: The procedure, risks, benefits, and alternatives were explained to the patient. Questions regarding the procedure were encouraged and answered. The patient understands and consents to the procedure. The right flank was prepped with Betadine in a sterile fashion, and a sterile drape was applied covering the operative field. A sterile gown and sterile gloves were used for the procedure. Under sonographic guidance, an 17 gauge guide needle was advanced into the right lobe liver lesion. Subsequently four 18 gauge core biopsies were obtained. Gel-Foam slurry was injected into the needle tract. The guide needle was removed. Final imaging was performed. Patient tolerated the procedure well without complication. Vital sign monitoring by nursing staff during the procedure will continue as patient is in the special procedures unit for post procedure observation. FINDINGS: The images document guide needle placement within the right lobe liver lesion. Post biopsy images demonstrate no hemorrhage. COMPLICATIONS: None. IMPRESSION: Successful ultrasound-guided core biopsy of a right lobe liver lesion. Electronically Signed   By: Marybelle Killings M.D.   On: 09/10/2015 08:12    Labs:  CBC:  Recent Labs  09/14/14 1150 07/19/15 0801 09/09/15 0956  WBC  --  6.2 7.6  HGB 14.3  --  11.6  HCT  --  39.2 35.4  PLT  --  184 201    COAGS:  Recent Labs  09/09/15 0956  INR 1.00*    BMP:  Recent Labs  07/19/15 0801 09/09/15 0957  NA 138 139  K 5.1 4.3  CL 99  --   CO2 23 26  GLUCOSE 99 103  BUN 18 18.8  CALCIUM 10.1 9.8  CREATININE 0.74 0.8  GFRNONAA 85  --   GFRAA 98  --     LIVER FUNCTION TESTS:  Recent Labs  07/19/15 0801 09/09/15 0957  BILITOT 0.3 0.31  AST 31 27  ALT 32 37  ALKPHOS 119* 147  PROT 6.4 6.9  ALBUMIN 3.9 3.4*    Assessment and Plan: Patient with history of recently diagnosed metastatic pancreatic cancer. Plan  today is for Port-A-Cath placement for chemotherapy.Risks and benefits discussed with the patient including, but not limited to bleeding, infection, pneumothorax, or fibrin sheath development and need for additional procedures.All of the patient's questions were answered, patient is agreeable to proceed.Consent signed and in chart.     Electronically Signed: D. Rowe Robert 09/13/2015, 1:39 PM   I spent a total of 15 minutes at the the patient's bedside AND on the patient's hospital floor or unit, greater than 50% of which was counseling/coordinating care for Port-A-Cath placement

## 2015-09-16 ENCOUNTER — Other Ambulatory Visit: Payer: Self-pay | Admitting: *Deleted

## 2015-09-17 ENCOUNTER — Encounter (HOSPITAL_COMMUNITY): Payer: Medicare Other

## 2015-09-17 ENCOUNTER — Encounter: Payer: PPO | Admitting: *Deleted

## 2015-09-17 ENCOUNTER — Ambulatory Visit: Payer: PPO

## 2015-09-17 ENCOUNTER — Telehealth: Payer: Self-pay | Admitting: *Deleted

## 2015-09-17 NOTE — Telephone Encounter (Signed)
  Oncology Nurse Navigator Documentation  Navigator Location: [facility] (09/17/15 1535) Navigator Encounter Type: Telephone (09/17/15 1535) Telephone: Incoming Call;Outgoing Call (09/17/15 1535) : Wendy Palmer left VM asking to move her nutritionist appointment from am to pm on 09/18/15 to avoid missing too much work. Called her back and made her aware there was not afternoon opening on 1/18, but could move her to 1/19 at 4 pm. Call back if she wishes to make that change.

## 2015-09-17 NOTE — Telephone Encounter (Signed)
Patient called back to confirm she prefers to come on 1/19 at 4 pm. Made change and left VM for Marcellus Scott in research with the schedule change.

## 2015-09-18 ENCOUNTER — Encounter: Payer: PPO | Admitting: *Deleted

## 2015-09-18 ENCOUNTER — Ambulatory Visit: Payer: PPO

## 2015-09-18 ENCOUNTER — Encounter: Payer: PPO | Admitting: Nutrition

## 2015-09-18 ENCOUNTER — Encounter: Payer: Self-pay | Admitting: *Deleted

## 2015-09-18 NOTE — Progress Notes (Signed)
Oncology Nurse Navigator Documentation  Oncology Nurse Navigator Flowsheets 09/18/2015  Navigator Location [facility]  Navigator Encounter Type Other  Telephone -  Abnormal Finding Date -  Confirmed Diagnosis Date -  Patient Visit Type Other-visit with research nurse  Treatment Phase -  Barriers/Navigation Needs Family concerns  Education Pain/ Symptom Management--constipation  Interventions Education Method  Referrals -  Coordination of Care -  Education Method Verbal--try 1/2 bottle of magnesium citrate tonight and repeat in am if no BM; continue Miralax daily and colace bid  Support Groups/Services -  Acuity -  Time Spent with Patient 15  Will request collaborative nurse escribe her EMLA cream. Will await her decision on going on study to order the Lovenox and Prednisone.

## 2015-09-19 ENCOUNTER — Encounter: Payer: PPO | Admitting: Nutrition

## 2015-09-19 ENCOUNTER — Other Ambulatory Visit: Payer: Self-pay | Admitting: *Deleted

## 2015-09-19 DIAGNOSIS — Z95828 Presence of other vascular implants and grafts: Secondary | ICD-10-CM

## 2015-09-19 MED ORDER — LIDOCAINE-PRILOCAINE 2.5-2.5 % EX CREA: TOPICAL_CREAM | CUTANEOUS | Status: DC

## 2015-09-20 ENCOUNTER — Other Ambulatory Visit: Payer: Self-pay | Admitting: Oncology

## 2015-09-23 ENCOUNTER — Encounter: Payer: Self-pay | Admitting: Oncology

## 2015-09-23 ENCOUNTER — Other Ambulatory Visit: Payer: Self-pay | Admitting: *Deleted

## 2015-09-23 DIAGNOSIS — C252 Malignant neoplasm of tail of pancreas: Secondary | ICD-10-CM

## 2015-09-23 NOTE — Progress Notes (Signed)
I sent to [employer] for prior auth for ondansetron.

## 2015-09-24 ENCOUNTER — Ambulatory Visit (HOSPITAL_BASED_OUTPATIENT_CLINIC_OR_DEPARTMENT_OTHER): Payer: PPO | Admitting: Oncology

## 2015-09-24 ENCOUNTER — Telehealth: Payer: Self-pay | Admitting: Oncology

## 2015-09-24 ENCOUNTER — Other Ambulatory Visit: Payer: Self-pay | Admitting: *Deleted

## 2015-09-24 ENCOUNTER — Ambulatory Visit: Payer: PPO

## 2015-09-24 ENCOUNTER — Telehealth: Payer: Self-pay | Admitting: *Deleted

## 2015-09-24 VITALS — BP 134/62 | HR 92 | Temp 98.8°F | Resp 18 | Ht 62.0 in | Wt 132.4 lb

## 2015-09-24 DIAGNOSIS — C252 Malignant neoplasm of tail of pancreas: Secondary | ICD-10-CM

## 2015-09-24 DIAGNOSIS — C787 Secondary malignant neoplasm of liver and intrahepatic bile duct: Secondary | ICD-10-CM | POA: Diagnosis not present

## 2015-09-24 NOTE — Progress Notes (Signed)
09/24/2015 1343  Late entry On 09/18/15 @ 1645 the patient signed  The S1313 consent form version  revision #[id number] dated 05/30/15 approved by [facility] on 09/10/2015. Marcellus Scott, RN

## 2015-09-24 NOTE — Telephone Encounter (Signed)
Talked with and scheduled this patient’s appointment(s) while patient was here in our office.       [facility]. °

## 2015-09-24 NOTE — Telephone Encounter (Signed)
per pof to add lab prior to inf-cld pt and adv pt to be here for 11:45 am for labs and inf @ 12:30

## 2015-09-24 NOTE — Progress Notes (Signed)
  [facility] OFFICE PROGRESS NOTE   Diagnosis: Pancreas cancer  INTERVAL HISTORY:   Ms. Knepp returns as scheduled. She saw Dr. Earnestine Mealing at [facility] on 09/20/2015 for a second opinion. She has decided to proceed with enrollment on the SWOG study. She continues to have abdomen and back pain. She has been working. Dilaudid relieves the pain at night.   Objective:  Vital signs in last 24 hours:  Blood pressure 134/62, pulse 92, temperature 98.8 F (37.1 C), temperature source Oral, resp. rate 18, height 5\' 2"  (1.575 m), weight 132 lb 6.4 oz (60.056 kg), SpO2 98 %.  Resp: Lungs clear bilaterally Cardio: Regular rate and rhythm GI: No hepatomegaly, no mass, nontender Vascular: No leg edema   Portacath/PICC-without erythema  Lab Results:  Lab Results  Component Value Date   WBC 5.8 09/13/2015   HGB 11.0* 09/13/2015   HCT 34.5* 09/13/2015   MCV 87.3 09/13/2015   PLT 221 09/13/2015   NEUTROABS 5.3 09/09/2015     Medications: I have reviewed the patient's current medications.  Assessment/Plan: 1. Pancreas cancer  MRI abdomen 08/27/2015 revealed a pancreas mass, adenopathy, and liver metastases  CTs 09/10/2015 with pancreas body/tail mass, liver metastases, lymphadenopathy, innumerable tiny pulmonary nodules  Ultrasound-guided biopsy of a right liver lesion 09/10/2015 confirmed adenocarcinoma  Enrollment on the SWOG S1313 study, randomized to the standard FOLFIRINOX arm  Cycle 1 FOLFIRINOX 09/25/2015  2. Pain secondary to #1  3. Constipation-likely secondary to narcotics    Disposition:  Ms. Weisman appears stable. She has enrolled on the SWOG S1313 study. She has been randomized to the FOLFIRINOX only arm of this study. She has attended a chemotherapy teaching class. She will receive Neulasta support. We reviewed the potential toxicities associated with Neulasta. Ms. Marconi agrees to proceed.  A first cycle of FOLFIRINOX is scheduled for  09/25/2015. She will return for an office visit and cycle 2 on 10/09/2015.  Betsy Coder, MD  09/24/2015  9:48 AM

## 2015-09-24 NOTE — Telephone Encounter (Signed)
Per staff message and POF I have scheduled appts. Advised scheduler of appts. JMW  

## 2015-09-25 ENCOUNTER — Ambulatory Visit: Payer: Medicare Other | Admitting: Nutrition

## 2015-09-25 ENCOUNTER — Other Ambulatory Visit (HOSPITAL_BASED_OUTPATIENT_CLINIC_OR_DEPARTMENT_OTHER): Payer: Medicare Other

## 2015-09-25 ENCOUNTER — Encounter: Payer: Self-pay | Admitting: *Deleted

## 2015-09-25 ENCOUNTER — Ambulatory Visit (HOSPITAL_BASED_OUTPATIENT_CLINIC_OR_DEPARTMENT_OTHER): Payer: Medicare Other

## 2015-09-25 VITALS — BP 137/68 | HR 80 | Temp 97.4°F

## 2015-09-25 DIAGNOSIS — C252 Malignant neoplasm of tail of pancreas: Secondary | ICD-10-CM

## 2015-09-25 DIAGNOSIS — Z006 Encounter for examination for normal comparison and control in clinical research program: Secondary | ICD-10-CM

## 2015-09-25 DIAGNOSIS — Z5112 Encounter for antineoplastic immunotherapy: Secondary | ICD-10-CM | POA: Diagnosis not present

## 2015-09-25 DIAGNOSIS — C787 Secondary malignant neoplasm of liver and intrahepatic bile duct: Secondary | ICD-10-CM

## 2015-09-25 LAB — COMPREHENSIVE METABOLIC PANEL
ALT: 34 U/L (ref 0–55)
ANION GAP: 6 meq/L (ref 3–11)
AST: 29 U/L (ref 5–34)
Albumin: 3.1 g/dL — ABNORMAL LOW (ref 3.5–5.0)
Alkaline Phosphatase: 153 U/L — ABNORMAL HIGH (ref 40–150)
BUN: 10.6 mg/dL (ref 7.0–26.0)
CALCIUM: 9.5 mg/dL (ref 8.4–10.4)
CHLORIDE: 103 meq/L (ref 98–109)
CO2: 29 meq/L (ref 22–29)
Creatinine: 0.8 mg/dL (ref 0.6–1.1)
EGFR: 74 mL/min/{1.73_m2} — AB (ref >90)
Glucose: 181 mg/dl — ABNORMAL HIGH (ref 70–140)
POTASSIUM: 4.6 meq/L (ref 3.5–5.1)
Sodium: 138 mEq/L (ref 136–145)
Total Bilirubin: 0.31 mg/dL (ref 0.20–1.20)
Total Protein: 6.3 g/dL — ABNORMAL LOW (ref 6.4–8.3)

## 2015-09-25 MED ORDER — ATROPINE SULFATE 1 MG/ML IJ SOLN
0.5000 mg | Freq: Once | INTRAMUSCULAR | Status: AC | PRN
  Administered 2015-09-25: 0.5 mg via INTRAVENOUS

## 2015-09-25 MED ORDER — FLUOROURACIL CHEMO INJECTION 5 GM/100ML
2400.0000 mg/m2 | INTRAVENOUS | Status: DC
  Administered 2015-09-25: 3900 mg via INTRAVENOUS
  Filled 2015-09-25: qty 78

## 2015-09-25 MED ORDER — ATROPINE SULFATE 1 MG/ML IJ SOLN
INTRAMUSCULAR | Status: AC
  Filled 2015-09-25: qty 1

## 2015-09-25 MED ORDER — OXALIPLATIN CHEMO INJECTION 100 MG/20ML
85.0000 mg/m2 | Freq: Once | INTRAVENOUS | Status: AC
  Administered 2015-09-25: 140 mg via INTRAVENOUS
  Filled 2015-09-25: qty 20

## 2015-09-25 MED ORDER — PALONOSETRON HCL INJECTION 0.25 MG/5ML
INTRAVENOUS | Status: AC
  Filled 2015-09-25: qty 5

## 2015-09-25 MED ORDER — DEXTROSE 5 % IV SOLN
Freq: Once | INTRAVENOUS | Status: AC
Start: 2015-09-25 — End: 2015-09-25
  Administered 2015-09-25: 13:00:00 via INTRAVENOUS

## 2015-09-25 MED ORDER — IRINOTECAN HCL CHEMO INJECTION 100 MG/5ML
180.0000 mg/m2 | Freq: Once | INTRAVENOUS | Status: AC
  Administered 2015-09-25: 292 mg via INTRAVENOUS
  Filled 2015-09-25: qty 14.6

## 2015-09-25 MED ORDER — LEUCOVORIN CALCIUM INJECTION 350 MG
400.0000 mg/m2 | Freq: Once | INTRAVENOUS | Status: AC
  Administered 2015-09-25: 648 mg via INTRAVENOUS
  Filled 2015-09-25: qty 32.4

## 2015-09-25 MED ORDER — PALONOSETRON HCL INJECTION 0.25 MG/5ML
0.2500 mg | Freq: Once | INTRAVENOUS | Status: AC
  Administered 2015-09-25: 0.25 mg via INTRAVENOUS

## 2015-09-25 MED ORDER — SODIUM CHLORIDE 0.9 % IV SOLN
Freq: Once | INTRAVENOUS | Status: AC
  Administered 2015-09-25: 13:00:00 via INTRAVENOUS
  Filled 2015-09-25: qty 5

## 2015-09-25 NOTE — Progress Notes (Signed)
09/25/2015 1225  [id number] Cycle 1, day 1 The patient returns to the clinic accompanied by her friend, Mr. Arville Lime.  She stated she is ready and willing to begin chemotherapy per protocol [id number].  She denies any changes in her condition since she was here on 09/24/15.  Her lab results (CMET)  from today were shown to Dr. Benay Spice and he approved.  Per Dr. Benay Spice, may begin treatment. This RN confirmed correct doses of chemotherapy agents and pre-meds with pharmacist, B. Perrson.  This RN reviewed the treatment plan directions and provided a sign with directions to Sabino Gasser, RN.  She was without any questions and verbalized understanding of infusion instructions for all drugs. The patient understands to contact the clinic at [phone] 24/7 for concerning side effects she may experience.   She denied further questions regarding potential side effects.  She confirmed her treatment schedule.  She is going to be seen by the dietician and GI navigator during her treatment today.

## 2015-09-25 NOTE — Patient Instructions (Signed)
[facility] Discharge Instructions for Patients Receiving Chemotherapy  Today you received the following chemotherapy agents Oxaliplatin/Irinotecan/Leucovorin/5 FU To help prevent nausea and vomiting after your treatment, we encourage you to take your nausea medication as prescribed.  If you develop nausea and vomiting that is not controlled by your nausea medication, call the clinic.   BELOW ARE SYMPTOMS THAT SHOULD BE REPORTED IMMEDIATELY:  *FEVER GREATER THAN 100.5 F  *CHILLS WITH OR WITHOUT FEVER  NAUSEA AND VOMITING THAT IS NOT CONTROLLED WITH YOUR NAUSEA MEDICATION  *UNUSUAL SHORTNESS OF BREATH  *UNUSUAL BRUISING OR BLEEDING  TENDERNESS IN MOUTH AND THROAT WITH OR WITHOUT PRESENCE OF ULCERS  *URINARY PROBLEMS  *BOWEL PROBLEMS  UNUSUAL RASH Items with * indicate a potential emergency and should be followed up as soon as possible.  Feel free to call the clinic you have any questions or concerns. The clinic phone number is [phone].  Please show the CHEMO ALERT CARD at check-in to the Emergency Department and triage nurse.   

## 2015-09-25 NOTE — Progress Notes (Signed)
OK to use CBC results from 09/13/15 per Dr. Benay Spice.  No need for additional CBC today.

## 2015-09-25 NOTE — Progress Notes (Signed)
Oncology Nurse Navigator Documentation  Oncology Nurse Navigator Flowsheets 09/25/2015  Navigator Location [facility]  Navigator Encounter Type Treatment  Telephone -  Abnormal Finding Date -  Confirmed Diagnosis Date -  Treatment Initiated Date 09/25/2015  Patient Visit Type MedOnc  Treatment Phase First Chemo Tx--FOLFIRINOX #1  Barriers/Navigation Needs Family concerns  Education Coping with Diagnosis/ Prognosis;Pain/ Symptom Management  Interventions Education Method  Referrals -  Coordination of Care -  Education Method Verbal  Support Groups/Services -  Acuity Level 2  Time Spent with Patient 30  Met w/patient and her brother during her chemo infusion. She is pleased w/how well the [place] access went. Inquired of nurse questions about her mortality and if nurse was aware of any states that legally assist a patient to end their life if that time came. She says she does not want to linger for a prolonged amount of time. Explained to her that she would most likely be in some type of chemo treatment indefinitely-may have some periods of brief remission, but the cancer will need to be treated as long as it is working and her quality of life is good. Informed her if the time came when it was no longer helping or she could not tolerate it any longer, Dr. Benay Spice would refer her to Hospice and her needs will be taken care of-he will not allow her to be in pain. She has a port, and if necessary she could be on a pain drip/infusion. She could stay at home as long as she wishes-she tells me that she does not care if she dies at home or in a hospice home. Encouraged her to think positive, pray and remember that she must continue to live through the treatment-her life does not stop just because she is going through chemo. Take up a new hobby or get back to reading again (she said she enjoys to read). Enjoy her relationships and give herself permission to let go of things that are not important. Provided  her with cookbook "Eating Well Through Cancer"--she said she plans on getting Ronalee Belts to cook for her.

## 2015-09-25 NOTE — Progress Notes (Signed)
65 year old female diagnosed with cancer of the pancreas.  She is a patient of Dr. Julieanne Manson.  Past medical history includes diverticulosis, depression, hypertension, and fracture of her patella.  Medications include vitamin D, vitamin B12, Colace, Pepcid, Ativan, multivitamin, Zofran, Protonix, and MiraLAX.  Labs include glucose 123 January 13.  Height: 62 inches. Weight: 132.4 pounds January 24. Usual body weight: 138 pounds November 2016. BMI: 24.21.  Patient is receiving chemotherapy today. Noted patient has enrolled in [facility] and will be receiving FOLFIRINOX.  Today's patient's first chemotherapy. She currently denies nutrition impact symptoms.  Nutrition diagnosis: Food and nutrition related knowledge deficit related to new diagnosis of pancreas cancer as evidenced by no prior need for nutrition related information.  Intervention: Educated patient to consume small frequent meals and snacks utilizing high protein high calorie foods to promote weight maintenance. Encouraged patient to consume nutrition supplements, shakes, smoothies as desired. Reviewed importance of increased hydration. Brief education provided on nausea and vomiting. Provided fact sheets on increasing calories and protein, nausea, vomiting, along with my contact information. Questions were answered and teach back method was used  Monitoring, evaluation, goals: Patient will tolerate increased calories and protein to promote weight maintenance.  Next visit: Wednesday, February 8, during infusion.  **Disclaimer: This note was dictated with voice recognition software. Similar sounding words can inadvertently be transcribed and this note may contain transcription errors which may not have been corrected upon publication of note.**

## 2015-09-26 ENCOUNTER — Telehealth: Payer: Self-pay | Admitting: *Deleted

## 2015-09-26 NOTE — Telephone Encounter (Signed)
Called pt, she reports she feels good. Mild nausea this AM, resolved without antiemetic. Pt denies diarrhea. Imodium instructions reviewed. 5FU continuous infusion running, pt denies any issues with pump. Instructed pt to call office as needed for side effect management. She voiced understanding.

## 2015-09-27 ENCOUNTER — Ambulatory Visit: Payer: PPO

## 2015-09-27 ENCOUNTER — Ambulatory Visit (HOSPITAL_BASED_OUTPATIENT_CLINIC_OR_DEPARTMENT_OTHER): Payer: Medicare Other

## 2015-09-27 ENCOUNTER — Encounter: Payer: Self-pay | Admitting: *Deleted

## 2015-09-27 VITALS — BP 133/54 | HR 79 | Temp 97.5°F | Resp 18

## 2015-09-27 DIAGNOSIS — C252 Malignant neoplasm of tail of pancreas: Secondary | ICD-10-CM

## 2015-09-27 DIAGNOSIS — Z5189 Encounter for other specified aftercare: Secondary | ICD-10-CM | POA: Diagnosis not present

## 2015-09-27 DIAGNOSIS — Z006 Encounter for examination for normal comparison and control in clinical research program: Secondary | ICD-10-CM | POA: Diagnosis not present

## 2015-09-27 MED ORDER — HEPARIN SOD (PORK) LOCK FLUSH 100 UNIT/ML IV SOLN
500.0000 [IU] | Freq: Once | INTRAVENOUS | Status: AC | PRN
  Administered 2015-09-27: 500 [IU]
  Filled 2015-09-27: qty 5

## 2015-09-27 MED ORDER — SODIUM CHLORIDE 0.9 % IJ SOLN
10.0000 mL | INTRAMUSCULAR | Status: DC | PRN
Start: 2015-09-27 — End: 2015-09-27
  Administered 2015-09-27: 10 mL
  Filled 2015-09-27: qty 10

## 2015-09-27 MED ORDER — PEGFILGRASTIM INJECTION 6 MG/0.6ML ~~LOC~~
6.0000 mg | PREFILLED_SYRINGE | Freq: Once | SUBCUTANEOUS | Status: AC
  Administered 2015-09-27: 6 mg via SUBCUTANEOUS
  Filled 2015-09-27: qty 0.6

## 2015-09-27 NOTE — Patient Instructions (Signed)

## 2015-09-27 NOTE — Progress Notes (Signed)
Neulasta injection given by flush nurse after home pump disconnected

## 2015-09-27 NOTE — Progress Notes (Signed)
09/27/2015 1540 The patient is here for pump DC and injection.  She reports having had minimal  side effects.  She reports feeling of cold sensation and forgot about drinking ice cold drinks when she got home on 09/25/15.  She denies diarrhea.  She has had minimal nausea.  She denies bleeding or other complaints.  She was reminded to call the main number if side effects occur and are not controlled.  She was reminded not to take any medications that are not on her medication list without consulting with MD first. She denied any questions.  Her friend, Mr. Arville Lime, was with her and stated she had a lot of support and help with driving, meals, etc.  He stated she had been very good about  eating and drinking.  The patient denied any further questions. This RN thanked the patient for her participation on the study.

## 2015-09-30 ENCOUNTER — Telehealth: Payer: Self-pay | Admitting: *Deleted

## 2015-09-30 NOTE — Telephone Encounter (Signed)
  Oncology Nurse Navigator Documentation  Navigator Location: [facility] (09/30/15 [id number]) Navigator Encounter Type: Telephone (09/30/15 [id number]) Telephone: Incoming Call;Symptom Mgt (09/30/15 [id number]): reporting constipation. No BM in few days. Says "I'm afraid to take a laxative for fear I can't get to the bathroom in time". Inquired if she was still on her Miralax and Colace and she said no.           Treatment Phase: Treatment (09/30/15 0905):S/P 1st cycle of FOLFIRINOX Barriers/Navigation Needs: Family concerns (09/30/15 [id number])  Interventions: Education Method (09/30/15 [id number])  Instructed to get back on her Miralax and take it 1-2/day every day, not just till she has a BM. Also needs to get back on her stool softener starting with 2/day. Push fluids, ambulate, try prunes or warm prune juice, hot tea or raisin bran cereal. Will ask collaborative nurse to follow up on her tomorrow.   Education Method: Verbal;Teach-back (09/30/15 [id number])                Time Spent with Patient: 15 (09/30/15 0905)

## 2015-10-02 ENCOUNTER — Ambulatory Visit: Payer: PPO | Admitting: Nurse Practitioner

## 2015-10-02 ENCOUNTER — Ambulatory Visit: Payer: PPO

## 2015-10-02 ENCOUNTER — Other Ambulatory Visit: Payer: PPO

## 2015-10-03 ENCOUNTER — Other Ambulatory Visit: Payer: Self-pay | Admitting: *Deleted

## 2015-10-03 MED ORDER — HYDROMORPHONE HCL 4 MG PO TABS: 4.0000 mg | ORAL_TABLET | ORAL | Status: DC | PRN

## 2015-10-03 NOTE — Telephone Encounter (Signed)
Message from [place], Triage R: Pt is requesting refill on Dilaudid.  1240: Left message on voicemail informing pt Rx is ready for pick up. Reminded pt to resume Miralax daily and stool softener twice daily.

## 2015-10-04 ENCOUNTER — Telehealth: Payer: Self-pay | Admitting: *Deleted

## 2015-10-04 NOTE — Telephone Encounter (Signed)
10/04/2015 1220  SWOG [id number] study Spoke with the patient by phone.  She stated she has been tired and has not done a whole lot this week.  She denied diarrhea or constipation.  She stated she has had some nausea, but no emesis.  She verbalized understanding to take nausea medication as needed. She confirmed that she has been eating and drinking.  She denied other complaints and thanked this RN for the call.

## 2015-10-06 ENCOUNTER — Other Ambulatory Visit: Payer: Self-pay | Admitting: Oncology

## 2015-10-08 ENCOUNTER — Ambulatory Visit: Payer: PPO

## 2015-10-08 ENCOUNTER — Other Ambulatory Visit: Payer: PPO

## 2015-10-08 ENCOUNTER — Ambulatory Visit: Payer: PPO | Admitting: Nurse Practitioner

## 2015-10-09 ENCOUNTER — Encounter: Payer: Self-pay | Admitting: *Deleted

## 2015-10-09 ENCOUNTER — Ambulatory Visit (HOSPITAL_BASED_OUTPATIENT_CLINIC_OR_DEPARTMENT_OTHER): Payer: Medicare Other

## 2015-10-09 ENCOUNTER — Telehealth: Payer: Self-pay | Admitting: *Deleted

## 2015-10-09 ENCOUNTER — Ambulatory Visit: Payer: PPO | Admitting: Oncology

## 2015-10-09 ENCOUNTER — Ambulatory Visit (HOSPITAL_BASED_OUTPATIENT_CLINIC_OR_DEPARTMENT_OTHER): Payer: Medicare Other | Admitting: Nurse Practitioner

## 2015-10-09 ENCOUNTER — Ambulatory Visit: Payer: PPO | Admitting: Nutrition

## 2015-10-09 ENCOUNTER — Telehealth: Payer: Self-pay | Admitting: Oncology

## 2015-10-09 ENCOUNTER — Other Ambulatory Visit (HOSPITAL_BASED_OUTPATIENT_CLINIC_OR_DEPARTMENT_OTHER): Payer: Medicare Other

## 2015-10-09 ENCOUNTER — Other Ambulatory Visit: Payer: Self-pay | Admitting: Nurse Practitioner

## 2015-10-09 VITALS — BP 126/61 | HR 99 | Temp 98.0°F | Resp 18 | Wt 128.7 lb

## 2015-10-09 DIAGNOSIS — Z5111 Encounter for antineoplastic chemotherapy: Secondary | ICD-10-CM

## 2015-10-09 DIAGNOSIS — C252 Malignant neoplasm of tail of pancreas: Secondary | ICD-10-CM

## 2015-10-09 DIAGNOSIS — C787 Secondary malignant neoplasm of liver and intrahepatic bile duct: Secondary | ICD-10-CM | POA: Diagnosis not present

## 2015-10-09 DIAGNOSIS — Z006 Encounter for examination for normal comparison and control in clinical research program: Secondary | ICD-10-CM | POA: Diagnosis not present

## 2015-10-09 DIAGNOSIS — K8689 Other specified diseases of pancreas: Secondary | ICD-10-CM | POA: Diagnosis not present

## 2015-10-09 LAB — COMPREHENSIVE METABOLIC PANEL
ALBUMIN: 3 g/dL — AB (ref 3.5–5.0)
ALK PHOS: 200 U/L — AB (ref 40–150)
ALT: 35 U/L (ref 0–55)
ANION GAP: 8 meq/L (ref 3–11)
AST: 25 U/L (ref 5–34)
BUN: 10.3 mg/dL (ref 7.0–26.0)
CO2: 28 meq/L (ref 22–29)
CREATININE: 0.8 mg/dL (ref 0.6–1.1)
Calcium: 9.2 mg/dL (ref 8.4–10.4)
Chloride: 103 mEq/L (ref 98–109)
EGFR: 79 mL/min/{1.73_m2} — ABNORMAL LOW (ref >90)
Glucose: 143 mg/dl — ABNORMAL HIGH (ref 70–140)
Potassium: 4.9 mEq/L (ref 3.5–5.1)
Sodium: 139 mEq/L (ref 136–145)
TOTAL PROTEIN: 6 g/dL — AB (ref 6.4–8.3)

## 2015-10-09 LAB — CBC WITH DIFFERENTIAL/PLATELET
BASO%: 0.5 % (ref 0.0–2.0)
BASOS ABS: 0.1 10*3/uL (ref 0.0–0.1)
EOS%: 3.5 % (ref 0.0–7.0)
Eosinophils Absolute: 0.3 10*3/uL (ref 0.0–0.5)
HEMATOCRIT: 35.5 % (ref 34.8–46.6)
HGB: 11.1 g/dL — ABNORMAL LOW (ref 11.6–15.9)
LYMPH#: 1.7 10*3/uL (ref 0.9–3.3)
LYMPH%: 17.4 % (ref 14.0–49.7)
MCH: 26.9 pg (ref 25.1–34.0)
MCHC: 31.3 g/dL — AB (ref 31.5–36.0)
MCV: 85.9 fL (ref 79.5–101.0)
MONO#: 0.7 10*3/uL (ref 0.1–0.9)
MONO%: 7.1 % (ref 0.0–14.0)
NEUT#: 7.1 10*3/uL — ABNORMAL HIGH (ref 1.5–6.5)
NEUT%: 71.5 % (ref 38.4–76.8)
PLATELETS: 149 10*3/uL (ref 145–400)
RBC: 4.13 10*6/uL (ref 3.70–5.45)
RDW: 14.9 % — ABNORMAL HIGH (ref 11.2–14.5)
WBC: 9.9 10*3/uL (ref 3.9–10.3)

## 2015-10-09 MED ORDER — DEXTROSE 5 % IV SOLN
Freq: Once | INTRAVENOUS | Status: AC
  Administered 2015-10-09: 13:00:00 via INTRAVENOUS

## 2015-10-09 MED ORDER — ATROPINE SULFATE 1 MG/ML IJ SOLN
INTRAMUSCULAR | Status: AC
  Filled 2015-10-09: qty 1

## 2015-10-09 MED ORDER — SODIUM CHLORIDE 0.9 % IV SOLN
2400.0000 mg/m2 | INTRAVENOUS | Status: DC
  Administered 2015-10-09: 3900 mg via INTRAVENOUS
  Filled 2015-10-09: qty 78

## 2015-10-09 MED ORDER — DEXTROSE 5 % IV SOLN
400.0000 mg/m2 | Freq: Once | INTRAVENOUS | Status: AC
  Administered 2015-10-09: 648 mg via INTRAVENOUS
  Filled 2015-10-09: qty 32.4

## 2015-10-09 MED ORDER — ATROPINE SULFATE 1 MG/ML IJ SOLN
0.5000 mg | Freq: Once | INTRAMUSCULAR | Status: AC | PRN
  Administered 2015-10-09: 0.5 mg via INTRAVENOUS

## 2015-10-09 MED ORDER — PANCRELIPASE (LIP-PROT-AMYL) 36000-114000 UNITS PO CPEP
36000.0000 [IU] | ORAL_CAPSULE | Freq: Three times a day (TID) | ORAL | Status: DC
Start: 2015-10-09 — End: 2015-11-06

## 2015-10-09 MED ORDER — IRINOTECAN HCL CHEMO INJECTION 100 MG/5ML
180.0000 mg/m2 | Freq: Once | INTRAVENOUS | Status: AC
  Administered 2015-10-09: 292 mg via INTRAVENOUS
  Filled 2015-10-09: qty 4.87

## 2015-10-09 MED ORDER — PALONOSETRON HCL INJECTION 0.25 MG/5ML
INTRAVENOUS | Status: AC
  Filled 2015-10-09: qty 5

## 2015-10-09 MED ORDER — OXALIPLATIN CHEMO INJECTION 100 MG/20ML
85.0000 mg/m2 | Freq: Once | INTRAVENOUS | Status: AC
  Administered 2015-10-09: 140 mg via INTRAVENOUS
  Filled 2015-10-09: qty 20

## 2015-10-09 MED ORDER — FOSAPREPITANT DIMEGLUMINE INJECTION 150 MG
Freq: Once | INTRAVENOUS | Status: AC
  Administered 2015-10-09: 13:00:00 via INTRAVENOUS
  Filled 2015-10-09: qty 5

## 2015-10-09 MED ORDER — PALONOSETRON HCL INJECTION 0.25 MG/5ML
0.2500 mg | Freq: Once | INTRAVENOUS | Status: AC
  Administered 2015-10-09: 0.25 mg via INTRAVENOUS

## 2015-10-09 NOTE — Progress Notes (Addendum)
  [place] OFFICE PROGRESS NOTE   Diagnosis:  Pancreas cancer  INTERVAL HISTORY:   Ms. Bjorge returns as scheduled. She completed cycle 1 FOLFIRINOX 09/25/2015. She has persistent low level nausea. She does not feel the nausea worsened following chemotherapy. No vomiting. No mouth sores. She is having loose stools about 30 minutes after eating 2-3 times a day. She had no significant problem with cold sensitivity. No persistent neuropathy symptoms. Pain is well-controlled with a combination of Vicodin during the day and Dilaudid at nighttime.  Objective:  Vital signs in last 24 hours:  Blood pressure 126/61, pulse 99, temperature 98 F (36.7 C), temperature source Oral, resp. rate 18, weight 128 lb 12 oz (58.4 kg), SpO2 100 %.    HEENT: No thrush or ulcers. Resp: Lungs clear bilaterally. Cardio: Regular rate and rhythm. GI: Abdomen soft and nontender. No hepatomegaly. No mass. Vascular: No leg edema. Neuro: Vibratory sense intact over the fingertips per tuning fork exam.  Skin: No rash. Port-A-Cath without erythema.    Lab Results:  Lab Results  Component Value Date   WBC 9.9 10/09/2015   HGB 11.1* 10/09/2015   HCT 35.5 10/09/2015   MCV 85.9 10/09/2015   PLT 149 10/09/2015   NEUTROABS 7.1* 10/09/2015    Imaging:  No results found.  Medications: I have reviewed the patient's current medications.  Assessment/Plan: 1. Pancreas cancer  MRI abdomen 08/27/2015 revealed a pancreas mass, adenopathy, and liver metastases  CTs 09/10/2015 with pancreas body/tail mass, liver metastases, lymphadenopathy, innumerable tiny pulmonary nodules  Ultrasound-guided biopsy of a right liver lesion 09/10/2015 confirmed adenocarcinoma  Enrollment on the SWOG S1313 study, randomized to the standard FOLFIRINOX arm  Cycle 1 FOLFIRINOX 09/25/2015  Cycle 2 FOLFIRINOX 10/09/2015  2. Pain secondary to #1  3. Constipation-likely secondary to narcotics  4.    Loose  stools after eating, likely due to pancreatic insufficiency. She will begin pancreatic enzyme replacement.   Disposition: Ms. Kornbluth appears stable. She has completed 1 cycle of FOLFIRINOX. Overall she tolerated the first cycle well. Plan to proceed with cycle 2 today as scheduled.  The loose stools after eating are likely related to pancreatic insufficiency. We will prescribe pancreatic enzyme replacement.  She will return for a follow-up visit and cycle 3 FOLFIRINOX in 2 weeks. She will contact the office in the interim with any problems. ECOG performance status measured at 1.  Patient seen with Dr. Benay Spice.    Ned Card ANP/GNP-BC   10/09/2015  11:52 AM  This was a shared visit with Ned Card. Ms. Maietta tolerated the FOLFIRINOX well. The plan is to proceed with cycle 2 today. She will begin a trial of pancreatic enzyme replacement.  Julieanne Manson, M.D.

## 2015-10-09 NOTE — Progress Notes (Signed)
10/09/2015 1235 [id number] Cycle 2 The patient arrives to clinic accompanied by her brother.  She stated she is starting to feel better with more energy and that she noticed it earlier in the week.  She stated she felt more fatigue the last 2 weeks.  She stated the fatigue was relieved with rest.  She denied bleeding, mucositis, fever, or skin issues over the past 2 weeks.  She reported having nausea off and on over the last 2 weeks, no vomiting.  She stated she did not really use any anti-nausea medication at home.  She complained of "loose/soft" stools over the last 2 weeks and no more than 2-3 per day.  She was taking Miralax to prevent constipation each day, never took any loperamide.  She said the bowel movements would come on after she ate a meal.  She stated she has been eating and drinking as instructed over the last 2 weeks.  She is due to be seen by the dietician today.  The patient was asking if she could take a supplement called synespalum dulcificum to stimulate her taste.  This RN reminded patient not to take any additional supplements without MD and study approval.  This RN will contact the study coordinator at [facility] for an answer as it is not addressed in the protocol. The patient was seen and examined by CL. Marcello Moores, NP.  She was seen by Dr. Benay Spice.  Based on lab results review and PAN, the patient was cleared to continue on to cycle 2 treatment.  The patient denied any questions.   Treatment instruction infusion sign was reviewed with and given to Elray Buba, Therapist, sports.  She was without any questions.

## 2015-10-09 NOTE — Telephone Encounter (Signed)
Per staff message and POF I have scheduled appts. Advised scheduler of appts. JMW  

## 2015-10-09 NOTE — Progress Notes (Signed)
Attempted nutrition follow-up with patient during chemotherapy. Patient was not interested in talking about nutrition today. Patient reports she has not felt very well. Weight decreased and documented as 128.75 pounds February 8 decreased from 132.4 pounds January 24. Patient complains of taste alterations and wonders about taking a product called "miracle fruit."  Nutrition diagnosis: Food and nutrition related knowledge deficit continues.  Intervention: Provided support and encouragement for patient to continue strategies for adequate oral intake. Provided information about miracle fruit but cautioned patient against beginning this product until she received approval from the doctor.  Patient is in the SWOG study. Patient assured me she would not take product until she receives physician approval.  Marcellus Scott, RN, is aware.  Monitoring, evaluation, goals: Patient will tolerate adequate calories and protein to minimize further weight loss.  Next visit: Wednesday, February 22, during infusion.  **Disclaimer: This note was dictated with voice recognition software. Similar sounding words can inadvertently be transcribed and this note may contain transcription errors which may not have been corrected upon publication of note.**

## 2015-10-09 NOTE — Telephone Encounter (Signed)
10/09/2015 1123 Left voice message with patient regarding today's appointment.  Checking on the patient to make sure she is okay and requested to call back if there are any questions regarding today's appointment.  Contact name and number were left in voice message.

## 2015-10-09 NOTE — Telephone Encounter (Signed)
Pt confirmed labs/ov per 02/08 POF, gave pt AVS and Calendar... KJ, sent msg to add chemo °

## 2015-10-09 NOTE — Patient Instructions (Signed)
[place] Discharge Instructions for Patients Receiving Chemotherapy  Today you received the following chemotherapy agents :  Oxaliplatin, Camptosar, Leucovorin, 5 FU.  To help prevent nausea and vomiting after your treatment, we encourage you to take your nausea medication as prescribed.   If you develop nausea and vomiting that is not controlled by your nausea medication, call the clinic.   BELOW ARE SYMPTOMS THAT SHOULD BE REPORTED IMMEDIATELY:  *FEVER GREATER THAN 100.5 F  *CHILLS WITH OR WITHOUT FEVER  NAUSEA AND VOMITING THAT IS NOT CONTROLLED WITH YOUR NAUSEA MEDICATION  *UNUSUAL SHORTNESS OF BREATH  *UNUSUAL BRUISING OR BLEEDING  TENDERNESS IN MOUTH AND THROAT WITH OR WITHOUT PRESENCE OF ULCERS  *URINARY PROBLEMS  *BOWEL PROBLEMS  UNUSUAL RASH Items with * indicate a potential emergency and should be followed up as soon as possible.  Feel free to call the clinic you have any questions or concerns. The clinic phone number is [phone].  Please show the [place] at check-in to the Emergency Department and triage nurse.

## 2015-10-09 NOTE — Progress Notes (Signed)
Oncology Nurse Navigator Documentation  Oncology Nurse Navigator Flowsheets 10/09/2015  Navigator Location [facility]  Navigator Encounter Type Treatment  Telephone -  Abnormal Finding Date -  Confirmed Diagnosis Date -  Treatment Initiated Date -  Patient Visit Type MedOnc  Treatment Phase Treatment #2 FOLFIRINOX  Barriers/Navigation Needs Education  Education Coping with Diagnosis/ Prognosis--  Interventions Medication assitance--Provided with co pay discount card for [place] and PAP contact info if card does not work  Referrals -  Coordination of Care -  Education Method Verbal  Support Groups/Services -  Acuity -  Time Spent with Patient 15  Asking RN how long she has to live and if she can be cured? Does not recall this discussion w/MD (it was discussed in prior visit). Reminded her that her cancer is Stage IV and chemo is not curative at this time-it is to control her cancer. Our goals are for tumor regression and then control of cancer. She will need to be in some type of treatment indefinitely. Our hope is to give her years as opposed to months (without tx). Reminded her every person is different and each will respond to chemo differently.  Encouraged her to continue to stay active and enjoy life-get out and do things w/her family and friends-short day trips and then rest. Discussed it would be reasonable to start having conversation w/her children about what her last wishes are in the future and discuss plans and make sure all her affairs are in order. This can also take a big stress off her to diminish that worry.

## 2015-10-10 ENCOUNTER — Telehealth: Payer: Self-pay

## 2015-10-10 NOTE — Telephone Encounter (Signed)
Pt daughter requested MD email addresses - states her mother "lost their cards".  Writer advised her she could email the MD directly via [place] and provided instructions to initiate the MyChart account.  She voiced understanding.

## 2015-10-11 ENCOUNTER — Ambulatory Visit (HOSPITAL_BASED_OUTPATIENT_CLINIC_OR_DEPARTMENT_OTHER): Payer: Medicare Other

## 2015-10-11 ENCOUNTER — Ambulatory Visit: Payer: PPO

## 2015-10-11 ENCOUNTER — Telehealth: Payer: Self-pay | Admitting: *Deleted

## 2015-10-11 VITALS — BP 161/82 | HR 106 | Temp 98.2°F | Resp 18

## 2015-10-11 DIAGNOSIS — Z006 Encounter for examination for normal comparison and control in clinical research program: Secondary | ICD-10-CM | POA: Diagnosis not present

## 2015-10-11 DIAGNOSIS — C252 Malignant neoplasm of tail of pancreas: Secondary | ICD-10-CM | POA: Diagnosis present

## 2015-10-11 DIAGNOSIS — Z5189 Encounter for other specified aftercare: Secondary | ICD-10-CM

## 2015-10-11 MED ORDER — SODIUM CHLORIDE 0.9 % IJ SOLN
10.0000 mL | INTRAMUSCULAR | Status: DC | PRN
  Administered 2015-10-11: 10 mL
  Filled 2015-10-11: qty 10

## 2015-10-11 MED ORDER — HEPARIN SOD (PORK) LOCK FLUSH 100 UNIT/ML IV SOLN
500.0000 [IU] | Freq: Once | INTRAVENOUS | Status: AC | PRN
  Administered 2015-10-11: 500 [IU]
  Filled 2015-10-11: qty 5

## 2015-10-11 MED ORDER — PEGFILGRASTIM INJECTION 6 MG/0.6ML ~~LOC~~
6.0000 mg | PREFILLED_SYRINGE | Freq: Once | SUBCUTANEOUS | Status: AC
  Administered 2015-10-11: 6 mg via SUBCUTANEOUS
  Filled 2015-10-11: qty 0.6

## 2015-10-11 NOTE — Patient Instructions (Signed)

## 2015-10-11 NOTE — Progress Notes (Signed)
Neulasta injection given by infusion nurse 

## 2015-10-11 NOTE — Telephone Encounter (Signed)
10/11/2015 1005 Spoke with the patient by phone.  She asked questions about her schedule.  She understands to come for her pump DC today and will then receive her Neulasta injection.  This RN reviewed the patient's schedule and she verbalized understanding. This RN also spoke with Dr. Benay Spice prior to this phone call. The patient had inquired about taking the fruit or supplement called synespalum dulcificum.  Dr. Benay Spice said he would not advise the patient to take it since it is not FDA approved and due to the unknown, in terms of medication interactions.  The patient was informed of above.  She verbalized understanding and said she would not take the synespalum dulcificum. This RN thanked the patient for her participation on the study and reminded her to call if she has any needs or questions.

## 2015-10-14 ENCOUNTER — Telehealth: Payer: Self-pay | Admitting: *Deleted

## 2015-10-14 NOTE — Telephone Encounter (Signed)
Received fax from on call service- pt's brother called to report she was depressed, had no energy and wouldn't get out of bed.  Called pt, left message on voicemail for her to call office to discuss symptoms.

## 2015-10-16 ENCOUNTER — Telehealth: Payer: Self-pay | Admitting: *Deleted

## 2015-10-16 NOTE — Telephone Encounter (Signed)
  Oncology Nurse Navigator Documentation  Navigator Location: [facility] (10/16/15 1333) Navigator Encounter Type: Telephone (10/16/15 1333) Telephone: Incoming Call;Outgoing Call;Patient Update (10/16/15 1333)  Daughter, Kensey asking for navigator email to send questions she has to Dr. Benay Spice to review. She wants to make MD aware that there is a trial at [facility] that Yani may be eligible for. Trial with CPI-613. Made note that she has not discussed this with Breslyn yet "so I won't get her hopes up". Provided her navigator email address and instructed her not to put her mom's name on the email to promote her health information security. Made research nurse, Marcellus Scott aware of the call also.

## 2015-10-17 ENCOUNTER — Encounter: Payer: Self-pay | Admitting: Oncology

## 2015-10-17 NOTE — Progress Notes (Signed)
Per envision we need new insurance information., tried to get ondansetron approved and eligibility can't be determined.

## 2015-10-18 ENCOUNTER — Telehealth: Payer: Self-pay | Admitting: *Deleted

## 2015-10-18 NOTE — Telephone Encounter (Signed)
Called to ask for RN's email Denny Peon has multiple questions for Dr. Benay Spice and would like him to call her back to discuss. Informed her to email questions, but do not put her mother's name on the email-I will know who it is about. Attached is her question list and this was forwarded to MD.  [facility] you are well - here are my questions  1. Dr. Benay Spice said he would write a note for mom for disability. Can that be done and given to mom next Wednesday?  2. We may be able to get into the CPI-613 clinical trial. Mom is being discussed at [facility] where it's being administered (Dr. Tia Masker). My friend happened to be an Solicitor in [employer] who is behind the trial. Let us know what you think about this trial for mom.   They also have discussed Gen Abraxin for her if she doesn't tolorate the Folfirinox well. I think she's started to do much better in folfirinox (besides the 2/3 bad days)  3. Can mom still be monitored by Dr. Benay Spice? She likes him (and you!) at lot!   4. What should we have on hand (scans etc) should we get into the trial. I imagine we will have to act quickly.   5. Mom has about 2 or 3 days bad days and gets better. She still remains weak the entire time - is that normal? We're trying to get her to take walks to keep her strength up.   6. Does she have just 2 treatments left before her scans?   7. When will she have a break from chemo and how long will the break last?  8. If Tumor shrinks enough, would she be a candidate for radiation or surgery?  9. Mom has some anxiety/depression. I have been pushing her to take her Lorazapam, but she's hesitant. I think it helps her when she takes it. Do you think it's a good idea to have her take that on tough days?  10. BBI-608 is also another trial I've looked into. The folfirinox has to fail to be eligible... but good to keep on the back burner. What do you think about this one?  11. I have a 80 month old. What  precautions should I take having him around mom?  Mom's back pain has lessened greatly... such good news. I hope this means the tumors are shrinking.   I apologize for the emailed questions. I couldn't  really make the Chemo treatments so far. I'm flying back and forth from [facility] with an infant and it's better for me to come after the treatment to take care of her at home. I think my sister will be there next time!  Thanks! Esmond Camper

## 2015-10-18 NOTE — Telephone Encounter (Signed)
error 

## 2015-10-18 NOTE — Telephone Encounter (Signed)
Tried to call patient's daughter Esmond Camper.  Reached her at [phone].  Tried to discuss answers to an e-mail [name] sent Navigator but interrupted.  "Those answers are not important.  Need to talk with Dr. Benay Spice today."   Expressed to Elisha Ponder is currently in a trial.  Answer to Navigator's request is 'no trial until after FOLFIRINOX'.              "This is not for her to start trial today but to determine if she can be a candidate for it.  He needs to let them know she is tolerating FOLFIRINOX well or per HIPAA law can I."  This is important and we need a call today."    Will notify of second request for call today.

## 2015-10-18 NOTE — Telephone Encounter (Signed)
Patient's daughter Makaylie Almaras called asking for Dr. Benay Spice to "speak with [place] about her mom participating in trial [id number].  Dr. Hassell Done is the provider's name.  A drug by [employer] would be given in combination with the FOLFIRINOX.  They need to know how she is tolerating the FOLFIRINOX.  We've talked to him about this but today the doctor is going out of the country is why this is time sensitive.  Mom wants to participate in this trial." Esmond Camper can be reached at [phone].   Will notify Dr. Benay Spice

## 2015-10-18 NOTE — Telephone Encounter (Signed)
10/18/2015 1300 Received a message from Dr. Gearldine Shown nurse, Amy,  that the patient's daughter, Esmond Camper, called and stated her mother said she was not on a clinical trial and that she wanted her mother to go on a clinical trial at [facility].  This RN called the patient.  She stated her daughter, Esmond Camper, wants her to go on a trial at [facility] since she "did not get in the trial" here.  This RN reminded the patient that she was still in the trial here, even though she did not get the investigational agent.  The patient replied that she knew this and just did not think she was "really" on a study since she did not get study drug.  This RN confirmed she was still on study. The patient stated that her daughter, Esmond Camper, was really pushing her to go on a trial at [facility].  This RN reminded the patient that she has the right to withdraw from the [id number] study at any time and that her participation is totally voluntary.  The patient stated "I know that" and that her daughter is the one encouraging this. This RN asked the patient if she had any further questions and she replied "no".  She thanked this Therapist, sports for calling. Marcellus Scott, RN  10/18/2015 1315 Received a phone call from the patient's daughter, Esmond Camper.  She stated she wanted her mother to go in a clinical trial at [facility].  She said her mother was in agreement to this.   The daughter  requested that Dr. Benay Spice contact a doctor named Dr. Loree Fee about the study at [facility].  This RN  gave Dr. Benay Spice the message and  the contact information as the patient's daughter requested.  Marcellus Scott, RN   10/18/2015 1330 The patient's daughter, Esmond Camper,  called this RN again and asked if Dr.Sherrill  was given the information.  The daughter was informed that Dr. Benay Spice was given the information and would discuss further at patient's next visit scheduled for 10/23/15.  The daughter thanked this Therapist, sports and was without further questions. Marcellus Scott, RN

## 2015-10-20 ENCOUNTER — Other Ambulatory Visit: Payer: Self-pay | Admitting: Oncology

## 2015-10-23 ENCOUNTER — Ambulatory Visit (HOSPITAL_BASED_OUTPATIENT_CLINIC_OR_DEPARTMENT_OTHER): Payer: Medicare Other

## 2015-10-23 ENCOUNTER — Telehealth: Payer: Self-pay | Admitting: *Deleted

## 2015-10-23 ENCOUNTER — Ambulatory Visit: Payer: PPO | Admitting: Nutrition

## 2015-10-23 ENCOUNTER — Encounter: Payer: Self-pay | Admitting: *Deleted

## 2015-10-23 ENCOUNTER — Other Ambulatory Visit (HOSPITAL_BASED_OUTPATIENT_CLINIC_OR_DEPARTMENT_OTHER): Payer: Medicare Other

## 2015-10-23 ENCOUNTER — Ambulatory Visit (HOSPITAL_BASED_OUTPATIENT_CLINIC_OR_DEPARTMENT_OTHER): Payer: Medicare Other | Admitting: Oncology

## 2015-10-23 ENCOUNTER — Telehealth: Payer: Self-pay | Admitting: Oncology

## 2015-10-23 VITALS — BP 134/67 | HR 102 | Temp 97.4°F | Resp 16 | Ht 62.0 in | Wt 125.7 lb

## 2015-10-23 DIAGNOSIS — Z006 Encounter for examination for normal comparison and control in clinical research program: Secondary | ICD-10-CM

## 2015-10-23 DIAGNOSIS — C787 Secondary malignant neoplasm of liver and intrahepatic bile duct: Secondary | ICD-10-CM

## 2015-10-23 DIAGNOSIS — C252 Malignant neoplasm of tail of pancreas: Secondary | ICD-10-CM

## 2015-10-23 DIAGNOSIS — Z5111 Encounter for antineoplastic chemotherapy: Secondary | ICD-10-CM

## 2015-10-23 DIAGNOSIS — G893 Neoplasm related pain (acute) (chronic): Secondary | ICD-10-CM

## 2015-10-23 LAB — COMPREHENSIVE METABOLIC PANEL
ALT: 33 U/L (ref 0–55)
ANION GAP: 10 meq/L (ref 3–11)
AST: 28 U/L (ref 5–34)
Albumin: 3.2 g/dL — ABNORMAL LOW (ref 3.5–5.0)
Alkaline Phosphatase: 212 U/L — ABNORMAL HIGH (ref 40–150)
BUN: 10 mg/dL (ref 7.0–26.0)
CO2: 27 meq/L (ref 22–29)
CREATININE: 0.8 mg/dL (ref 0.6–1.1)
Calcium: 9.6 mg/dL (ref 8.4–10.4)
Chloride: 103 mEq/L (ref 98–109)
EGFR: 75 mL/min/{1.73_m2} — ABNORMAL LOW (ref >90)
Glucose: 129 mg/dl (ref 70–140)
Potassium: 4.6 mEq/L (ref 3.5–5.1)
Sodium: 141 mEq/L (ref 136–145)
TOTAL PROTEIN: 6.4 g/dL (ref 6.4–8.3)

## 2015-10-23 LAB — CBC WITH DIFFERENTIAL/PLATELET
BASO%: 1.1 % (ref 0.0–2.0)
Basophils Absolute: 0.1 10*3/uL (ref 0.0–0.1)
EOS%: 3.9 % (ref 0.0–7.0)
Eosinophils Absolute: 0.4 10*3/uL (ref 0.0–0.5)
HEMATOCRIT: 35.4 % (ref 34.8–46.6)
HGB: 11.3 g/dL — ABNORMAL LOW (ref 11.6–15.9)
LYMPH#: 2.1 10*3/uL (ref 0.9–3.3)
LYMPH%: 21.8 % (ref 14.0–49.7)
MCH: 27.2 pg (ref 25.1–34.0)
MCHC: 31.9 g/dL (ref 31.5–36.0)
MCV: 85.3 fL (ref 79.5–101.0)
MONO#: 0.9 10*3/uL (ref 0.1–0.9)
MONO%: 8.7 % (ref 0.0–14.0)
NEUT%: 64.5 % (ref 38.4–76.8)
NEUTROS ABS: 6.4 10*3/uL (ref 1.5–6.5)
PLATELETS: 159 10*3/uL (ref 145–400)
RBC: 4.15 10*6/uL (ref 3.70–5.45)
RDW: 15.7 % — ABNORMAL HIGH (ref 11.2–14.5)
WBC: 9.9 10*3/uL (ref 3.9–10.3)

## 2015-10-23 MED ORDER — SODIUM CHLORIDE 0.9 % IV SOLN
2400.0000 mg/m2 | INTRAVENOUS | Status: DC
  Administered 2015-10-23: 3900 mg via INTRAVENOUS
  Filled 2015-10-23: qty 78

## 2015-10-23 MED ORDER — SODIUM CHLORIDE 0.9 % IV SOLN
Freq: Once | INTRAVENOUS | Status: AC
  Administered 2015-10-23: 12:00:00 via INTRAVENOUS
  Filled 2015-10-23: qty 5

## 2015-10-23 MED ORDER — PROCHLORPERAZINE MALEATE 10 MG PO TABS: 10.0000 mg | ORAL_TABLET | Freq: Four times a day (QID) | ORAL | Status: DC | PRN

## 2015-10-23 MED ORDER — OXALIPLATIN CHEMO INJECTION 100 MG/20ML
85.0000 mg/m2 | Freq: Once | INTRAVENOUS | Status: AC
  Administered 2015-10-23: 140 mg via INTRAVENOUS
  Filled 2015-10-23: qty 28

## 2015-10-23 MED ORDER — IRINOTECAN HCL CHEMO INJECTION 100 MG/5ML
180.0000 mg/m2 | Freq: Once | INTRAVENOUS | Status: AC
  Administered 2015-10-23: 292 mg via INTRAVENOUS
  Filled 2015-10-23: qty 4.87

## 2015-10-23 MED ORDER — DEXTROSE 5 % IV SOLN
Freq: Once | INTRAVENOUS | Status: AC
  Administered 2015-10-23: 11:00:00 via INTRAVENOUS

## 2015-10-23 MED ORDER — LEUCOVORIN CALCIUM INJECTION 350 MG
400.0000 mg/m2 | Freq: Once | INTRAVENOUS | Status: AC
  Administered 2015-10-23: 648 mg via INTRAVENOUS
  Filled 2015-10-23: qty 32.4

## 2015-10-23 MED ORDER — PALONOSETRON HCL INJECTION 0.25 MG/5ML
0.2500 mg | Freq: Once | INTRAVENOUS | Status: AC
  Administered 2015-10-23: 0.25 mg via INTRAVENOUS

## 2015-10-23 MED ORDER — PALONOSETRON HCL INJECTION 0.25 MG/5ML
INTRAVENOUS | Status: AC
  Filled 2015-10-23: qty 5

## 2015-10-23 MED ORDER — LIDOCAINE-PRILOCAINE 2.5-2.5 % EX CREA
TOPICAL_CREAM | CUTANEOUS | Status: AC
  Filled 2015-10-23: qty 5

## 2015-10-23 MED ORDER — ATROPINE SULFATE 1 MG/ML IJ SOLN
0.5000 mg | Freq: Once | INTRAMUSCULAR | Status: AC | PRN
  Administered 2015-10-23: 0.5 mg via INTRAVENOUS

## 2015-10-23 MED ORDER — ATROPINE SULFATE 1 MG/ML IJ SOLN
INTRAMUSCULAR | Status: AC
  Filled 2015-10-23: qty 1

## 2015-10-23 NOTE — Progress Notes (Signed)
Oncology Nurse Navigator Documentation  Oncology Nurse Navigator Flowsheets 10/23/2015  Navigator Location [facility]  Navigator Encounter Type Treatment  Telephone -  Abnormal Finding Date 08/27/2015  Confirmed Diagnosis Date 09/09/2015  Treatment Initiated Date -  Patient Visit Type MedOnc  Treatment Phase Active Tx-FOLFIRINOX #3  Barriers/Navigation Needs Family concerns  Education Concerns with Finances/ Eligibility--Disability  Interventions Other--Provided contact # and website to apply for SS Disability  Referrals -  Coordination of Care -  Education Method Verbal;Written  Support Groups/Services GI Support Group;Other--made her aware of counseling services should she wish to pursue this  Acuity -  Time Spent with Patient 30  Supportive listening as she describes her anxiety and fear of the future. Says she has couple of cries/day. She does get out and interacts with people. Sleeping OK and eating OK. Does not feel she requires intervention at this time. Trying to focus on not worrying.

## 2015-10-23 NOTE — Progress Notes (Signed)
10/23/2015 1115  [id number] Cycle 3, day 1 The patient returns to clinic accompanied by a neighbor who is a good friend.  She stated that she feels a lot better today.  She said the treatments are tough and she starts to feel better just in time to come back in for more.  The patient reports having small amounts of diarrhea from 10/10/15 through 10/12/15.  She stated she had to alternate Imodium and Miralax.  She denies diarrhea or constipation today.  She said she has had a decreased appetite, but has made herself eat.  She has grade 1 (5%) weight loss from baseline weight (done on 09/24/15).  She is due to be seen today by dietician. She denies mouth sores on the inside of her mouth, but has a "healing" sore on her lower lip.  She reports it is much better today. She reports her pain is much improved and that she is only taking pain pills at night  and an occasional one during the day.  She reports fatigue after her treatments, but much improved today.  She denies any numbness or tingling in hands and feet.  She denies breathing changes.  She said that she has not taken any new medications that are not listed on her medication sheet. Dr. Benay Spice was in to examine the patient.  He reviewed the current study with the patient and answered questions the patient and her daughter (who was conference called in on the phone) had about other potential studies.  Their questions were answered.  The patient acknowledged that she wants to remain on the study here at [facility] under Dr. Gearldine Shown care.  She denied any further questions. Based on PE and lab results review, the patient was cleared by MD to proceed with treatment. Dosages (based on weight changes from today to baseline) were confirmed with B. Perrson, Pharm. D and required no dose changes.  Infusion sign with instructions was given to Arna Snipe, RN.  She was without questions.

## 2015-10-23 NOTE — Progress Notes (Signed)
  Wendy Palmer OFFICE PROGRESS NOTE   Diagnosis: Pancreas cancer  INTERVAL HISTORY:   She returns as scheduled. She completed another cycle FOLFIRINOX 10/09/2015. She reports tolerating the chemotherapy well. Mild cold sensitivity following chemotherapy. No neuropathy symptoms today. No diarrhea. Good appetite. The abdomen and back pain have improved. She now takes Dilaudid only in the evening. She did not start the pancreatic enzyme replacement.  Objective:  Vital signs in last 24 hours:  Blood pressure 134/67, pulse 102, temperature 97.4 F (36.3 C), temperature source Oral, resp. rate 16, height 5\' 2"  (1.575 m), weight 125 lb 11.2 oz (57.017 kg), SpO2 98 %.    HEENT: Oral cavity without thrush or ulcers, healing ulcer at the lower outer lip Resp: Lungs clear bilaterally Cardio: Regular rate and rhythm GI: No mass, no hepatomegaly, nontender Vascular: No leg edema   Portacath/PICC-without erythema  Lab Results:  Lab Results  Component Value Date   WBC 9.9 10/23/2015   HGB 11.3* 10/23/2015   HCT 35.4 10/23/2015   MCV 85.3 10/23/2015   PLT 159 10/23/2015   NEUTROABS 6.4 10/23/2015      Medications: I have reviewed the patient's current medications.  Assessment/Plan: 1. Pancreas cancer  MRI abdomen 08/27/2015 revealed a pancreas mass, adenopathy, and liver metastases  CTs 09/10/2015 with pancreas body/tail mass, liver metastases, lymphadenopathy, innumerable tiny pulmonary nodules  Ultrasound-guided biopsy of a right liver lesion 09/10/2015 confirmed adenocarcinoma  Enrollment on the SWOG S1313 study, randomized to the standard FOLFIRINOX arm  Cycle 1 FOLFIRINOX 09/25/2015  Cycle 2 FOLFIRINOX 10/09/2015  Cycle 3 FOLFIRINOX 10/23/2015  2. Pain secondary to #1, improved  3. History of Constipation-likely secondary to narcotics   Disposition:  Wendy Palmer has completed 2 cycles of FOLFIRINOX on the SWOG study. She has tolerated  chemotherapy well and her pain is improved. The plan is to proceed with cycle 3 FOLFIRINOX today. She will undergo restaging CT scans after cycle 4.  I discussed the treatment plan with her daughter by telephone today. She has been in communication with a [place] regarding an agent used in combination of FOLFIRINOX on an early phase clinical trial at [facility]. The clinical trial has closed. I contacted the medical liaison from the [place] further request of the patient and her daughter on 10/18/2015. I explained to Wendy Palmer that she would need to come off of the SWOG study in order to receive the experimental agent via "compassionate use ". This drug has not been proven beneficial in a randomized study. She is comfortable continuing the FOLFIRINOX per the SWOG study. We will consider clinical trial options if she develops disease progression in the future.  Wendy Palmer will return for an office visit and chemotherapy in 2 weeks.  Her ECOG performance status is measured at 1 today.  Betsy Coder, MD  10/23/2015  4:50 PM

## 2015-10-23 NOTE — Progress Notes (Signed)
10/23/2015 1045  S1313 Cycle 3, day 1 The patient returns to clinic today accompanied by a friend.  She reports feeling well today.  She stated that

## 2015-10-23 NOTE — Telephone Encounter (Signed)
Pt confirmed labs/ov per 02/22 POF, gave pt AVS and Calendar...Marland KitchenMarland KitchenCherylann Banas, sent msg to add chemo and gave pt barium

## 2015-10-23 NOTE — Progress Notes (Signed)
Nutrition follow-up completed with patient infusion for cancer of the pancreas. Patient reports nausea is not controlled. States she tried Compazine, but did not like the way it made her feel She is forcing herself to eat but really has no appetite. Weight decreased and documented as 125.7 pounds February 22, down from 128.75 pounds February 8.  Nutrition diagnosis: Food and nutrition related knowledge deficit continues.  Intervention: Educated patient about the importance of finding a nausea medication that controls her nausea.   Will ask nursing to get involved in evaluation of current nausea medication and seek out M.D. for prescriptions as needed. Enforced importance of increased hydration. Recommended patient drink a milk shake or Carnation breakfast essentials twice a day Questions were answered.  Teach back method used.  Monitoring, evaluation, goals: Patient has not been able to consume adequate calories and protein, and has seen 3 pound weight loss.  Next visit: Wednesday, March 8, during infusion.  **Disclaimer: This note was dictated with voice recognition software. Similar sounding words can inadvertently be transcribed and this note may contain transcription errors which may not have been corrected upon publication of note.**

## 2015-10-23 NOTE — Patient Instructions (Signed)
[name] Discharge Instructions for Patients Receiving Chemotherapy  Today you received the following chemotherapy agents :  Oxaliplatin, Camptosar, Leucovorin, 5 FU.  To help prevent nausea and vomiting after your treatment, we encourage you to take your nausea medication as prescribed.   If you develop nausea and vomiting that is not controlled by your nausea medication, call the clinic.   BELOW ARE SYMPTOMS THAT SHOULD BE REPORTED IMMEDIATELY:  *FEVER GREATER THAN 100.5 F  *CHILLS WITH OR WITHOUT FEVER  NAUSEA AND VOMITING THAT IS NOT CONTROLLED WITH YOUR NAUSEA MEDICATION  *UNUSUAL SHORTNESS OF BREATH  *UNUSUAL BRUISING OR BLEEDING  TENDERNESS IN MOUTH AND THROAT WITH OR WITHOUT PRESENCE OF ULCERS  *URINARY PROBLEMS  *BOWEL PROBLEMS  UNUSUAL RASH Items with * indicate a potential emergency and should be followed up as soon as possible.  Feel free to call the clinic you have any questions or concerns. The clinic phone number is [phone].  Please show the [name] at check-in to the Emergency Department and triage nurse.

## 2015-10-23 NOTE — Progress Notes (Signed)
Discharge medication reviewed with patient. Patient verbalized understanding of Prochlorperazine prescription and was instructed to call clinic with concerns.

## 2015-10-23 NOTE — Telephone Encounter (Signed)
Per staff message and POF I have scheduled appts. Advised scheduler of appts. JMW  

## 2015-10-24 ENCOUNTER — Telehealth: Payer: Self-pay | Admitting: *Deleted

## 2015-10-24 LAB — CANCER ANTIGEN 19-9: CA 19-9: 5 U/mL (ref 0–35)

## 2015-10-24 NOTE — Telephone Encounter (Signed)
10/24/2015 1112 Patient called asking what the prescription for Creon was for and that she did not pick it up yesterday due to it being so expensive.  This RN reminded the patient that it had been prescribed a few weeks ago when she reported having loose stools after eating meals and that MD thought it could possibly be related to needing enzyme replacement.  This RN reminded the patient of her discussion with the MD yesterday and that he was aware she had not started Creon.  Dr. Benay Spice had told her that if she was not having any further loose stools after meals or trouble digesting food that it was okay not to start it.  The patient remembered the conversation after being reminded.  She was also reminded that if she starts taking Creon, she was given a discount voucher from GI Navigator. The patient also reported that the Compazine helped her nausea last night and that she slept well and felt good today. The patient denied further questions and thanked this Therapist, sports.

## 2015-10-25 ENCOUNTER — Encounter: Payer: Self-pay | Admitting: *Deleted

## 2015-10-25 ENCOUNTER — Ambulatory Visit: Payer: PPO

## 2015-10-25 ENCOUNTER — Ambulatory Visit (HOSPITAL_BASED_OUTPATIENT_CLINIC_OR_DEPARTMENT_OTHER): Payer: Medicare Other

## 2015-10-25 VITALS — BP 126/65 | HR 99 | Temp 98.5°F | Resp 16

## 2015-10-25 DIAGNOSIS — C252 Malignant neoplasm of tail of pancreas: Secondary | ICD-10-CM

## 2015-10-25 DIAGNOSIS — Z006 Encounter for examination for normal comparison and control in clinical research program: Secondary | ICD-10-CM

## 2015-10-25 MED ORDER — SODIUM CHLORIDE 0.9 % IJ SOLN
10.0000 mL | INTRAMUSCULAR | Status: DC | PRN
  Administered 2015-10-25: 10 mL
  Filled 2015-10-25: qty 10

## 2015-10-25 MED ORDER — PEGFILGRASTIM INJECTION 6 MG/0.6ML ~~LOC~~
6.0000 mg | PREFILLED_SYRINGE | Freq: Once | SUBCUTANEOUS | Status: AC
  Administered 2015-10-25: 6 mg via SUBCUTANEOUS
  Filled 2015-10-25: qty 0.6

## 2015-10-25 MED ORDER — HEPARIN SOD (PORK) LOCK FLUSH 100 UNIT/ML IV SOLN
500.0000 [IU] | Freq: Once | INTRAVENOUS | Status: AC | PRN
  Administered 2015-10-25: 500 [IU]
  Filled 2015-10-25: qty 5

## 2015-10-25 NOTE — Progress Notes (Signed)
Neulasta injection given by flush nurse after home infusion pump disconnected. 

## 2015-10-25 NOTE — Patient Instructions (Signed)

## 2015-10-25 NOTE — Progress Notes (Signed)
10/25/2015 1600 The patient came in for her pump DC and injection.  She was accompanied by her friend, Shanon Brow.  The patient was informed that Dr.Sherrill wanted to speak with her regarding e-mail inquiries the patient's daughter, Esmond Camper, had sent him regarding [id number].  Dr. Benay Spice spoke with the patient.  The patient stated she was unaware that her daughter had emailed him.  The patient stated that she was comfortable staying on the S1313 trial.  Dr. Benay Spice explained to the patient that he was always thinking about future trials.  He recommenced continuing current trial until re-staging is done to assess for response.  He also reminded her that participation on a clinical trial is voluntary and that she may withdraw at any time.  She confirmed she did not want to change her current therapy.  Dr. Benay Spice said he would be glad to discuss with her daughter by phone or conference call in at next visit.  The patient verbalized understanding and had no questions.

## 2015-10-29 ENCOUNTER — Other Ambulatory Visit: Payer: Self-pay | Admitting: *Deleted

## 2015-10-29 DIAGNOSIS — C252 Malignant neoplasm of tail of pancreas: Secondary | ICD-10-CM

## 2015-10-30 NOTE — Progress Notes (Signed)
Counseling Intern Note: Pt called today requesting counseling services, encouraged by Merceda Elks, RN. Pt scheduled to meet with counseling intern Vaughan Sine on 11/01/15.   Vaughan Sine Counseling Intern [phone]

## 2015-11-01 NOTE — Progress Notes (Signed)
Counseling Intern Note: Met with Pt for intake counseling session.  Pt presented as well groomed and oriented x4 with clear speech and good eye contact. Pt occasionally lost train of thought. Completed intake paperwork and informed consent. Explored presenting concerns including facing mortality.  Pt presents as very insightful and aware of her internal experience. Counseling intern provided supportive environment for exploring emotions. Took biopsychosocial history and collaborated on direction for future counseling sessions.   Next counseling session scheduled for 3/8 in the infusion room.

## 2015-11-03 ENCOUNTER — Other Ambulatory Visit: Payer: Self-pay | Admitting: Oncology

## 2015-11-06 ENCOUNTER — Encounter: Payer: Self-pay | Admitting: *Deleted

## 2015-11-06 ENCOUNTER — Other Ambulatory Visit (HOSPITAL_BASED_OUTPATIENT_CLINIC_OR_DEPARTMENT_OTHER): Payer: Medicare Other

## 2015-11-06 ENCOUNTER — Telehealth: Payer: Self-pay | Admitting: Oncology

## 2015-11-06 ENCOUNTER — Ambulatory Visit (HOSPITAL_BASED_OUTPATIENT_CLINIC_OR_DEPARTMENT_OTHER): Payer: Medicare Other | Admitting: Oncology

## 2015-11-06 ENCOUNTER — Ambulatory Visit: Payer: PPO | Admitting: Nutrition

## 2015-11-06 ENCOUNTER — Ambulatory Visit (HOSPITAL_BASED_OUTPATIENT_CLINIC_OR_DEPARTMENT_OTHER): Payer: Medicare Other

## 2015-11-06 VITALS — BP 128/58 | HR 104 | Temp 98.0°F | Resp 18 | Ht 62.0 in | Wt 123.1 lb

## 2015-11-06 DIAGNOSIS — Z006 Encounter for examination for normal comparison and control in clinical research program: Secondary | ICD-10-CM

## 2015-11-06 DIAGNOSIS — C787 Secondary malignant neoplasm of liver and intrahepatic bile duct: Secondary | ICD-10-CM

## 2015-11-06 DIAGNOSIS — Z5111 Encounter for antineoplastic chemotherapy: Secondary | ICD-10-CM

## 2015-11-06 DIAGNOSIS — C252 Malignant neoplasm of tail of pancreas: Secondary | ICD-10-CM | POA: Diagnosis present

## 2015-11-06 LAB — CBC WITH DIFFERENTIAL/PLATELET
BASO%: 0.4 % (ref 0.0–2.0)
Basophils Absolute: 0.1 10*3/uL (ref 0.0–0.1)
EOS%: 2.1 % (ref 0.0–7.0)
Eosinophils Absolute: 0.3 10*3/uL (ref 0.0–0.5)
HCT: 34.5 % — ABNORMAL LOW (ref 34.8–46.6)
HEMOGLOBIN: 11 g/dL — AB (ref 11.6–15.9)
LYMPH%: 20.9 % (ref 14.0–49.7)
MCH: 27.8 pg (ref 25.1–34.0)
MCHC: 31.9 g/dL (ref 31.5–36.0)
MCV: 87.1 fL (ref 79.5–101.0)
MONO#: 0.9 10*3/uL (ref 0.1–0.9)
MONO%: 7.6 % (ref 0.0–14.0)
NEUT%: 69 % (ref 38.4–76.8)
NEUTROS ABS: 8.5 10*3/uL — AB (ref 1.5–6.5)
Platelets: 169 10*3/uL (ref 145–400)
RBC: 3.96 10*6/uL (ref 3.70–5.45)
RDW: 17.4 % — AB (ref 11.2–14.5)
WBC: 12.4 10*3/uL — AB (ref 3.9–10.3)
lymph#: 2.6 10*3/uL (ref 0.9–3.3)

## 2015-11-06 LAB — COMPREHENSIVE METABOLIC PANEL
ALBUMIN: 3.1 g/dL — AB (ref 3.5–5.0)
ALK PHOS: 211 U/L — AB (ref 40–150)
ALT: 28 U/L (ref 0–55)
AST: 23 U/L (ref 5–34)
Anion Gap: 9 mEq/L (ref 3–11)
BUN: 9.4 mg/dL (ref 7.0–26.0)
CO2: 28 mEq/L (ref 22–29)
Calcium: 9.6 mg/dL (ref 8.4–10.4)
Chloride: 104 mEq/L (ref 98–109)
Creatinine: 0.8 mg/dL (ref 0.6–1.1)
EGFR: 80 mL/min/{1.73_m2} — ABNORMAL LOW (ref >90)
GLUCOSE: 115 mg/dL (ref 70–140)
Potassium: 4.8 mEq/L (ref 3.5–5.1)
SODIUM: 141 meq/L (ref 136–145)
TOTAL PROTEIN: 6.2 g/dL — AB (ref 6.4–8.3)

## 2015-11-06 MED ORDER — PALONOSETRON HCL INJECTION 0.25 MG/5ML
INTRAVENOUS | Status: AC
  Filled 2015-11-06: qty 5

## 2015-11-06 MED ORDER — LORAZEPAM 0.5 MG PO TABS: 0.5000 mg | ORAL_TABLET | Freq: Three times a day (TID) | ORAL | Status: DC | PRN

## 2015-11-06 MED ORDER — ATROPINE SULFATE 1 MG/ML IJ SOLN
INTRAMUSCULAR | Status: AC
  Filled 2015-11-06: qty 1

## 2015-11-06 MED ORDER — HYDROMORPHONE HCL 4 MG PO TABS: 4.0000 mg | ORAL_TABLET | ORAL | Status: DC | PRN

## 2015-11-06 MED ORDER — DEXTROSE 5 % IV SOLN
Freq: Once | INTRAVENOUS | Status: AC
  Administered 2015-11-06: 12:00:00 via INTRAVENOUS

## 2015-11-06 MED ORDER — HYDROCODONE-ACETAMINOPHEN 5-325 MG PO TABS: 1.0000 | ORAL_TABLET | Freq: Four times a day (QID) | ORAL | Status: DC | PRN

## 2015-11-06 MED ORDER — IRINOTECAN HCL CHEMO INJECTION 100 MG/5ML
180.0000 mg/m2 | Freq: Once | INTRAVENOUS | Status: AC
  Administered 2015-11-06: 292 mg via INTRAVENOUS
  Filled 2015-11-06: qty 4.87

## 2015-11-06 MED ORDER — SODIUM CHLORIDE 0.9 % IV SOLN
2400.0000 mg/m2 | INTRAVENOUS | Status: DC
  Administered 2015-11-06: 3900 mg via INTRAVENOUS
  Filled 2015-11-06: qty 78

## 2015-11-06 MED ORDER — OXALIPLATIN CHEMO INJECTION 100 MG/20ML
85.0000 mg/m2 | Freq: Once | INTRAVENOUS | Status: AC
  Administered 2015-11-06: 140 mg via INTRAVENOUS
  Filled 2015-11-06: qty 8

## 2015-11-06 MED ORDER — SODIUM CHLORIDE 0.9 % IV SOLN
Freq: Once | INTRAVENOUS | Status: AC
  Administered 2015-11-06: 12:00:00 via INTRAVENOUS
  Filled 2015-11-06: qty 5

## 2015-11-06 MED ORDER — ATROPINE SULFATE 1 MG/ML IJ SOLN
0.5000 mg | Freq: Once | INTRAMUSCULAR | Status: AC | PRN
  Administered 2015-11-06: 0.5 mg via INTRAVENOUS

## 2015-11-06 MED ORDER — PALONOSETRON HCL INJECTION 0.25 MG/5ML
0.2500 mg | Freq: Once | INTRAVENOUS | Status: AC
  Administered 2015-11-06: 0.25 mg via INTRAVENOUS

## 2015-11-06 MED ORDER — LEUCOVORIN CALCIUM INJECTION 350 MG
400.0000 mg/m2 | Freq: Once | INTRAMUSCULAR | Status: AC
  Administered 2015-11-06: 648 mg via INTRAVENOUS
  Filled 2015-11-06: qty 32.4

## 2015-11-06 NOTE — Progress Notes (Signed)
Nutrition follow-up completed with patient during infusion for cancer of the pancreas. Patient reports she has been eating better and has done well over the past week. She feels discouraged her weight has decreased to 123.1 pounds from 125.7 pounds February 22. Patient is drinking one ensure or Carnation breakfast daily between meals.  Nutrition diagnosis: Food and nutrition related knowledge deficit continues.  Intervention: Patient was educated to increase ensure to Ensure Plus or ensure Enlive twice a day between meals. Educated patient to focus on high-calorie, high-protein foods. Teach back method used.  Monitoring, evaluation, goals: Patient will tolerate increased calories and protein to minimize further weight loss.  Next visit: Wednesday, March 22, during infusion.  **Disclaimer: This note was dictated with voice recognition software. Similar sounding words can inadvertently be transcribed and this note may contain transcription errors which may not have been corrected upon publication of note.**

## 2015-11-06 NOTE — Progress Notes (Signed)
Oncology Nurse Navigator Documentation  Oncology Nurse Navigator Flowsheets 11/06/2015  Navigator Location [facility]  Navigator Encounter Type Treatment;Follow-up Appt  Telephone -  Abnormal Finding Date -  Confirmed Diagnosis Date -  Treatment Initiated Date -  Patient Visit Type MedOnc  Treatment Phase Active Tx--FOLFIRINOX #4/5 before scans  Barriers/Navigation Needs Financial;Family concerns  Education Other;Concerns with Finances/ Eligibility  Interventions Other--provided patient with contact phone # and website for her SSD application  Coordination of Care -  Education Method Written  Support Groups/Services GI Support Group;Other--phone # to call for free table massage  Acuity -  Time Spent with Patient 30  Met w/patient and her daughters today; daughter Esmond Camper had several questions for MD, especially related to compassionate use of BBI-608 drug. MD answered all questions. Paulina said her appointment with the counselor went well. Is interested in massage if OK. She was attempting to ask MD questions as daughters were driving conversation-advocated for [place] to allow her to ask her questions. Tells RN that she already receives social security for her deceased husband. Still encouraged her to apply for herself to be sure that she receives the maximum benefit she is eligible for.

## 2015-11-06 NOTE — Telephone Encounter (Signed)
per pof to sch pt appt-gave pt copy of avs-appts already sch

## 2015-11-06 NOTE — Progress Notes (Signed)
Counseling Intern Note: Met with Pt in infusion room. Provided supportive atmosphere for client exploration.  Pt expressed a desire to feel hopeful but also wants to emotionally prepare herself for the possibility that tx is unsuccessful.  Pt expressed a desire to reach acceptance of whatever her prognosis turns out to be. Discussed emotional processing and possible directions for future sessions.   FU: Will meet with Pt in infusion room again on 2/22  Vaughan Sine Counseling Intern [phone]

## 2015-11-06 NOTE — Patient Instructions (Signed)
[place] Discharge Instructions for Patients Receiving Chemotherapy  Today you received the following chemotherapy agents Oxaliplatin/5 Fu/ Leucovorin/ Irinotecan To help prevent nausea and vomiting after your treatment, we encourage you to take your nausea medication as prescribed.  If you develop nausea and vomiting that is not controlled by your nausea medication, call the clinic.   BELOW ARE SYMPTOMS THAT SHOULD BE REPORTED IMMEDIATELY:  *FEVER GREATER THAN 100.5 F  *CHILLS WITH OR WITHOUT FEVER  NAUSEA AND VOMITING THAT IS NOT CONTROLLED WITH YOUR NAUSEA MEDICATION  *UNUSUAL SHORTNESS OF BREATH  *UNUSUAL BRUISING OR BLEEDING  TENDERNESS IN MOUTH AND THROAT WITH OR WITHOUT PRESENCE OF ULCERS  *URINARY PROBLEMS  *BOWEL PROBLEMS  UNUSUAL RASH Items with * indicate a potential emergency and should be followed up as soon as possible.  Feel free to call the clinic you have any questions or concerns. The clinic phone number is [phone].  Please show the [place] at check-in to the Emergency Department and triage nurse.

## 2015-11-06 NOTE — Progress Notes (Signed)
  [place] OFFICE PROGRESS NOTE   Diagnosis: Pancreas cancer  INTERVAL HISTORY:   Wendy Palmer returns as scheduled. She completed another cycle FOLFIRINOX 10/23/2015. She reports fatigue lasting 3-4 days following chemotherapy. She had soreness of the tongue without discrete ulcers. The blister at the lower lip has healed. No neuropathy symptoms. In general her pain is improved, though she has noted increased pain for the past few days. She takes hydrocodone during the day and Dilaudid at night.  Objective:  Vital signs in last 24 hours:  Blood pressure 128/58, pulse 104, temperature 98 F (36.7 C), temperature source Oral, resp. rate 18, height 5\' 2"  (1.575 m), weight 123 lb 1.6 oz (55.838 kg), SpO2 97 %.    HEENT: No thrush or ulcers Resp: Lungs clear bilaterally Cardio: Regular rate and rhythm GI: No hepatomegaly, nontender Vascular: No leg edema Neuro: Very mild decrease in vibratory sense at the fingertips bilaterally  Skin: Palms without erythema   Portacath/PICC-without erythema  Lab Results:  Lab Results  Component Value Date   WBC 12.4* 11/06/2015   HGB 11.0* 11/06/2015   HCT 34.5* 11/06/2015   MCV 87.1 11/06/2015   PLT 169 11/06/2015   NEUTROABS 8.5* 11/06/2015     Medications: I have reviewed the patient's current medications.  Assessment/Plan: 1. Pancreas cancer  MRI abdomen 08/27/2015 revealed a pancreas mass, adenopathy, and liver metastases  CTs 09/10/2015 with pancreas body/tail mass, liver metastases, lymphadenopathy, innumerable tiny pulmonary nodules  Ultrasound-guided biopsy of a right liver lesion 09/10/2015 confirmed adenocarcinoma  Enrollment on the SWOG S1313 study, randomized to the standard FOLFIRINOX arm  Cycle 1 FOLFIRINOX 09/25/2015  Cycle 2 FOLFIRINOX 10/09/2015  Cycle 3 FOLFIRINOX 10/23/2015  Cycle 4 FOLFIRINOX 11/05/2015  2. Pain secondary to #1, improved  3. History of Constipation-likely secondary to  narcotics    Disposition:  Wendy Palmer appears stable. She continues to tolerate the chemotherapy well. The plan is to proceed with cycle 4 FOLFIRINOX today. She will undergo a restaging CT prior to an office visit in 2 weeks.  I had a long discussion with Wendy Palmer and her daughters regarding treatment options. She is interested in treatment with CPI-613, an experimental agent tested in a phase II study with FOLFIRINOX. I explained the data with this agent is preliminary and she could not receive this agent while on the current clinical trial. They are interested in obtaining compassionate use with this drug. We explained a compassionate use program would need to be established by the pharmaceutical company and we would then need to obtain approval for use here. I agreed to check into the availability of the CPI-613 agent, but we will not administer this drug here while she is on the SWOG study. We would need to have a more complete understanding of the toxicity profile of this agent and background scientific data.  I do not recommend discontinuing the SWOG study if the restaging CT confirms a clinical response.  She will return for an office visit in 2 weeks.  Betsy Coder, MD  11/06/2015  10:43 AM

## 2015-11-06 NOTE — Progress Notes (Signed)
11/06/2015 1130 Cycle 4, day 1 S1313 The patient returns today accompanied by both of her daughters.  The patient reports feeling well today and that she walked a mile yesterday. She reports intermittent fatigue especially after she receives treatment.   She complains of mouth soreness, but has no sores in her mouth.  She denies pain when eating and reports her appetite is okay, despite losing a few pounds.  She said she has intermittent nausea that occurs  for several days after treatment and then improves.  She reports it was constant at baseline and is better.  She has had no emesis.   The patient denies diarrhea or constipation.   She had neither after this last cycle of chemotherapy, but continues to take a stool softer for prevention of constipation..  She denies abdominal pain or cramps,  breathing problems, swelling, bleeding, or other complaints.  Dr. Benay Spice was in to assess the patient.  He discussed the patient's current treatment with her daughters and that he recommends continuing on the current S1313 clinical trial.  He discussed re-staging scans and treatment based upon those results, scan due 11/19/15.  The patient's daughters were strongly encouraging their mother to consider a compassionate use program using the drug CPI-613 and coming off current trial.  Dr. Benay Spice said he would look into the availability of CPI-613 and reminded them that it is not approved by the FDA.  He reiterated to all of them that the current therapy the patient is receiving is the best known and approved at this time and that, in his clinical opinion, the patient is responding.  All of their questions were answered and the patient and her daughters denied further questions. The patient expressed a desire to continue on current protocol treatment. Based on physical exam and lab results review by Dr. Benay Spice, the patient was cleared to continue with treatment.  The treatment dosages were confirmed with pharmacist,  Mosetta Pigeon.  Infusion instruction sign given to Etta Grandchild, RN.  She was without questions.

## 2015-11-08 ENCOUNTER — Ambulatory Visit (HOSPITAL_BASED_OUTPATIENT_CLINIC_OR_DEPARTMENT_OTHER): Payer: Medicare Other

## 2015-11-08 ENCOUNTER — Ambulatory Visit: Payer: Medicare Other

## 2015-11-08 ENCOUNTER — Ambulatory Visit: Payer: PPO

## 2015-11-08 DIAGNOSIS — Z006 Encounter for examination for normal comparison and control in clinical research program: Secondary | ICD-10-CM | POA: Diagnosis not present

## 2015-11-08 DIAGNOSIS — C252 Malignant neoplasm of tail of pancreas: Secondary | ICD-10-CM

## 2015-11-08 MED ORDER — HEPARIN SOD (PORK) LOCK FLUSH 100 UNIT/ML IV SOLN
500.0000 [IU] | Freq: Once | INTRAVENOUS | Status: AC
  Administered 2015-11-08: 500 [IU] via INTRAVENOUS
  Filled 2015-11-08: qty 5

## 2015-11-08 MED ORDER — SODIUM CHLORIDE 0.9% FLUSH
10.0000 mL | INTRAVENOUS | Status: DC | PRN
  Administered 2015-11-08: 10 mL via INTRAVENOUS
  Filled 2015-11-08: qty 10

## 2015-11-08 MED ORDER — PEGFILGRASTIM INJECTION 6 MG/0.6ML ~~LOC~~
6.0000 mg | PREFILLED_SYRINGE | Freq: Once | SUBCUTANEOUS | Status: AC
  Administered 2015-11-08: 6 mg via SUBCUTANEOUS
  Filled 2015-11-08: qty 0.6

## 2015-11-08 NOTE — Patient Instructions (Signed)

## 2015-11-13 ENCOUNTER — Encounter: Payer: Self-pay | Admitting: *Deleted

## 2015-11-16 ENCOUNTER — Other Ambulatory Visit: Payer: Self-pay | Admitting: Oncology

## 2015-11-19 ENCOUNTER — Encounter (HOSPITAL_COMMUNITY): Payer: Self-pay

## 2015-11-19 ENCOUNTER — Other Ambulatory Visit: Payer: PPO

## 2015-11-19 ENCOUNTER — Ambulatory Visit (HOSPITAL_COMMUNITY)
Admission: RE | Admit: 2015-11-19 | Discharge: 2015-11-19 | Disposition: A | Discharge status: Home / Self Care | Payer: Medicare Other | Source: Ambulatory Visit | Attending: Oncology | Admitting: Oncology

## 2015-11-19 DIAGNOSIS — C787 Secondary malignant neoplasm of liver and intrahepatic bile duct: Secondary | ICD-10-CM | POA: Diagnosis not present

## 2015-11-19 DIAGNOSIS — R918 Other nonspecific abnormal finding of lung field: Secondary | ICD-10-CM | POA: Insufficient documentation

## 2015-11-19 DIAGNOSIS — R188 Other ascites: Secondary | ICD-10-CM | POA: Diagnosis not present

## 2015-11-19 DIAGNOSIS — C252 Malignant neoplasm of tail of pancreas: Secondary | ICD-10-CM | POA: Insufficient documentation

## 2015-11-19 DIAGNOSIS — R590 Localized enlarged lymph nodes: Secondary | ICD-10-CM | POA: Insufficient documentation

## 2015-11-19 MED ORDER — IOPAMIDOL (ISOVUE-300) INJECTION 61%
100.0000 mL | Freq: Once | INTRAVENOUS | Status: AC | PRN
  Administered 2015-11-19: 100 mL via INTRAVENOUS

## 2015-11-20 ENCOUNTER — Ambulatory Visit (HOSPITAL_BASED_OUTPATIENT_CLINIC_OR_DEPARTMENT_OTHER): Payer: Medicare Other | Admitting: Nurse Practitioner

## 2015-11-20 ENCOUNTER — Encounter: Payer: Self-pay | Admitting: *Deleted

## 2015-11-20 ENCOUNTER — Ambulatory Visit: Payer: Medicare Other | Admitting: Nutrition

## 2015-11-20 ENCOUNTER — Telehealth: Payer: Self-pay | Admitting: Oncology

## 2015-11-20 ENCOUNTER — Encounter: Payer: Self-pay | Admitting: Nutrition

## 2015-11-20 ENCOUNTER — Other Ambulatory Visit (HOSPITAL_BASED_OUTPATIENT_CLINIC_OR_DEPARTMENT_OTHER): Payer: Medicare Other

## 2015-11-20 ENCOUNTER — Ambulatory Visit (HOSPITAL_BASED_OUTPATIENT_CLINIC_OR_DEPARTMENT_OTHER): Payer: Medicare Other

## 2015-11-20 ENCOUNTER — Ambulatory Visit: Payer: Medicare Other

## 2015-11-20 VITALS — BP 140/58 | HR 98 | Temp 98.5°F | Resp 18

## 2015-11-20 VITALS — BP 119/55 | HR 103 | Temp 98.5°F | Resp 18 | Ht 62.0 in | Wt 121.4 lb

## 2015-11-20 DIAGNOSIS — Z5111 Encounter for antineoplastic chemotherapy: Secondary | ICD-10-CM

## 2015-11-20 DIAGNOSIS — C252 Malignant neoplasm of tail of pancreas: Secondary | ICD-10-CM

## 2015-11-20 DIAGNOSIS — C787 Secondary malignant neoplasm of liver and intrahepatic bile duct: Secondary | ICD-10-CM

## 2015-11-20 DIAGNOSIS — Z006 Encounter for examination for normal comparison and control in clinical research program: Secondary | ICD-10-CM

## 2015-11-20 DIAGNOSIS — Z95828 Presence of other vascular implants and grafts: Secondary | ICD-10-CM

## 2015-11-20 LAB — COMPREHENSIVE METABOLIC PANEL
ALT: 25 U/L (ref 0–55)
ANION GAP: 7 meq/L (ref 3–11)
AST: 26 U/L (ref 5–34)
Albumin: 2.8 g/dL — ABNORMAL LOW (ref 3.5–5.0)
Alkaline Phosphatase: 197 U/L — ABNORMAL HIGH (ref 40–150)
BUN: 10.3 mg/dL (ref 7.0–26.0)
CHLORIDE: 103 meq/L (ref 98–109)
CO2: 29 meq/L (ref 22–29)
CREATININE: 0.7 mg/dL (ref 0.6–1.1)
Calcium: 8.7 mg/dL (ref 8.4–10.4)
EGFR: 84 mL/min/{1.73_m2} — ABNORMAL LOW (ref >90)
Glucose: 148 mg/dl — ABNORMAL HIGH (ref 70–140)
Potassium: 4 mEq/L (ref 3.5–5.1)
Sodium: 138 mEq/L (ref 136–145)
TOTAL PROTEIN: 5.6 g/dL — AB (ref 6.4–8.3)
Total Bilirubin: <0.3 mg/dL (ref 0.20–1.20)

## 2015-11-20 LAB — CBC WITH DIFFERENTIAL/PLATELET
BASO%: 0.2 % (ref 0.0–2.0)
Basophils Absolute: 0 10*3/uL (ref 0.0–0.1)
EOS%: 3.6 % (ref 0.0–7.0)
Eosinophils Absolute: 0.3 10*3/uL (ref 0.0–0.5)
HEMATOCRIT: 32.1 % — AB (ref 34.8–46.6)
HGB: 10.1 g/dL — ABNORMAL LOW (ref 11.6–15.9)
LYMPH#: 1.9 10*3/uL (ref 0.9–3.3)
LYMPH%: 19.8 % (ref 14.0–49.7)
MCH: 27.7 pg (ref 25.1–34.0)
MCHC: 31.5 g/dL (ref 31.5–36.0)
MCV: 88.2 fL (ref 79.5–101.0)
MONO#: 1 10*3/uL — AB (ref 0.1–0.9)
MONO%: 10.4 % (ref 0.0–14.0)
NEUT%: 66 % (ref 38.4–76.8)
NEUTROS ABS: 6.2 10*3/uL (ref 1.5–6.5)
PLATELETS: 133 10*3/uL — AB (ref 145–400)
RBC: 3.64 10*6/uL — AB (ref 3.70–5.45)
RDW: 19.7 % — ABNORMAL HIGH (ref 11.2–14.5)
WBC: 9.3 10*3/uL (ref 3.9–10.3)

## 2015-11-20 MED ORDER — FLUOROURACIL CHEMO INJECTION 5 GM/100ML
2400.0000 mg/m2 | INTRAVENOUS | Status: DC
  Administered 2015-11-20: 3900 mg via INTRAVENOUS
  Filled 2015-11-20: qty 78

## 2015-11-20 MED ORDER — PALONOSETRON HCL INJECTION 0.25 MG/5ML
INTRAVENOUS | Status: AC
  Filled 2015-11-20: qty 5

## 2015-11-20 MED ORDER — SODIUM CHLORIDE 0.9% FLUSH
10.0000 mL | INTRAVENOUS | Status: DC | PRN
  Administered 2015-11-20: 10 mL via INTRAVENOUS
  Filled 2015-11-20: qty 10

## 2015-11-20 MED ORDER — PALONOSETRON HCL INJECTION 0.25 MG/5ML
0.2500 mg | Freq: Once | INTRAVENOUS | Status: AC
  Administered 2015-11-20: 0.25 mg via INTRAVENOUS

## 2015-11-20 MED ORDER — SODIUM CHLORIDE 0.9 % IV SOLN
Freq: Once | INTRAVENOUS | Status: AC
  Administered 2015-11-20: 11:00:00 via INTRAVENOUS

## 2015-11-20 MED ORDER — SODIUM CHLORIDE 0.9 % IV SOLN
Freq: Once | INTRAVENOUS | Status: AC
  Administered 2015-11-20: 11:00:00 via INTRAVENOUS
  Filled 2015-11-20: qty 5

## 2015-11-20 MED ORDER — LEUCOVORIN CALCIUM INJECTION 350 MG
400.0000 mg/m2 | Freq: Once | INTRAVENOUS | Status: AC
  Administered 2015-11-20: 648 mg via INTRAVENOUS
  Filled 2015-11-20: qty 32.4

## 2015-11-20 MED ORDER — DEXTROSE 5 % IV SOLN
Freq: Once | INTRAVENOUS | Status: AC
  Administered 2015-11-20: 12:00:00 via INTRAVENOUS

## 2015-11-20 MED ORDER — ATROPINE SULFATE 1 MG/ML IJ SOLN
INTRAMUSCULAR | Status: AC
  Filled 2015-11-20: qty 1

## 2015-11-20 MED ORDER — IRINOTECAN HCL CHEMO INJECTION 100 MG/5ML
180.0000 mg/m2 | Freq: Once | INTRAVENOUS | Status: AC
  Administered 2015-11-20: 292 mg via INTRAVENOUS
  Filled 2015-11-20: qty 4.87

## 2015-11-20 MED ORDER — ATROPINE SULFATE 1 MG/ML IJ SOLN
0.5000 mg | Freq: Once | INTRAMUSCULAR | Status: AC | PRN
  Administered 2015-11-20: 0.5 mg via INTRAVENOUS

## 2015-11-20 MED ORDER — OXALIPLATIN CHEMO INJECTION 100 MG/20ML
85.0000 mg/m2 | Freq: Once | INTRAVENOUS | Status: AC
  Administered 2015-11-20: 140 mg via INTRAVENOUS
  Filled 2015-11-20: qty 20

## 2015-11-20 NOTE — Telephone Encounter (Signed)
Gave and printed appt shced and avs fo rpt for March adn April

## 2015-11-20 NOTE — Progress Notes (Signed)
Patient did not show up for nutrition appointment.  She called cancer Center and reports she just forgot to come to appointment.

## 2015-11-20 NOTE — Progress Notes (Signed)
Please disregard previous note stating patient did not show up for nutrition appointment.  This was made in error.

## 2015-11-20 NOTE — Progress Notes (Signed)
11/20/2015 [id number] The CT scan from 11/19/15 was checked and compared to previous CT from 09/09/15.  The target and non-target lesions were compared. This was checked and confirmed with Kenton Kingfisher, Research Manager, on 11/19/15.  The RECIST table was given to Dr. Benay Spice to approve.  Per Dr. Benay Spice, "this is stable disease".   Marcellus Scott, RN  11/20/2015  1115 Cycle 5 , day 1 The patient returns to clinic accompanied by her daughters.  Today is her birthday.  She states she feels good today.  She reports her fatigue comes and goes.  She has been taking walks and getting out for the past several days.  She reports her tongue burns at times when eating, but that she has no sores or ulcers in her mouth.  She denies diarrhea and constipation.  She reports intermittent nausea off and on but denies any vomiting.   She denies numbness or tingling.  She reports that she has cold sensitivity for 3-4 days after her treatments.  She does not have any redness or peeling of her hands or feet. She reports her back pain is better and that she takes her pain medications as prescribed.  She denies bleeding or other problems. A physical exam was performed by Ned Card, NP.  Dr. Benay Spice came in and reviewed the CT scan results with the patient and her daughters.  He explained the report and said that she has stable disease.  He answered all of their questions.  He recommended continuing with current treatment. The patient was cleared to continue protocol treatment based on lab results review, physical exam, and CT review by Dr. Benay Spice.  The patient agreed to continue with current treatment and was very pleased with the results she was given. The patient's daughter, Esmond Camper, asked Dr. Benay Spice about the compassionate use program using the drug, [id number].  Dr. Benay Spice said he would not recommend giving the drug at this time.  He explained to her that he would be happy to speak with the physician that she had been  communicating with.  She verbalized understanding. This RN reminded the patient to call for side effects.  This RN reminded the patient to wear sunscreen.  The patient verbalized understanding. The patient will be seen by dietician today. Infusion instruction sign was given to Meribeth Mattes, RN.  This RN reviewed the  patient's current weight and drug dosages with Milus Height, Pharm. Josefine Class, RN

## 2015-11-20 NOTE — Progress Notes (Signed)
At approx 1445, pt reports"cramping in both hands, twitching and heaviness in eyes, and slurred speech".  Pt appears to be able to form full clear sentences. Infusion paused and NS started to gravity. Pt reports that this usually happens in the last thirty minutes of infusion treatments and subsides soon after infusion is complete. Selena Lesser NP and Ned Card NP aware and both agree to pause infusion for approx 15 minutes and is symptoms subside to restart infusion. At 1505 pt states that she has slight weakness in her hands but no other symptoms present at this time. Infusion restarted and pt educated to notify nurse if any symptoms return or worsen. Pt verbalizes understanding.  1520: Pt tolerating infusion well at this time, no new or worsening symptoms. Selena Lesser NP aware.  Report given to Roda Shutters. Pt in stable condition at this time.

## 2015-11-20 NOTE — Patient Instructions (Signed)

## 2015-11-20 NOTE — Progress Notes (Signed)
Counseling Intern Note: Met with Pt in infusion room. Provided therapeutic space for emotional processing. Pt reported and increased mood due to scan results and reported and increase in feelings of hope and optimism.  Discussed interpersonal relationships and sources of support.  FU: Will meet with pt again on 4/5  Vaughan Sine [phone]

## 2015-11-20 NOTE — Progress Notes (Addendum)
[facility] OFFICE PROGRESS NOTE   Diagnosis:   Pancreas cancer  INTERVAL HISTORY:   Ms. Decatur returns as scheduled. She completed cycle 4 FOLFIRINOX 11/05/2015. She has mild intermittent nausea. She notes that her tongue becomes sensitive following treatment. No ulcers in the mouth. No diarrhea. The cold sensitivity typically last 2-3 days. No persistent neuropathy symptoms. She has mild low back pain. She takes hydrocodone during the day as needed and Dilaudid mainly at bedtime. She reports her pain is significantly better as compared to pretreatment.  Objective:  Vital signs in last 24 hours:  Blood pressure 119/55, pulse 103, temperature 98.5 F (36.9 C), temperature source Oral, resp. rate 18, height 5\' 2"  (1.575 m), weight 121 lb 6.4 oz (55.067 kg), SpO2 98 %.    HEENT: No thrush or ulcers. Resp: Lungs clear bilaterally. Cardio: Regular rate and rhythm. GI: Abdomen soft and nontender. No hepatomegaly. Vascular: No leg edema. Neuro: Vibratory sense intact over the fingertips per tuning fork exam.  Skin: No rash. Port-A-Cath without erythema.    Lab Results:  Lab Results  Component Value Date   WBC 9.3 11/20/2015   HGB 10.1* 11/20/2015   HCT 32.1* 11/20/2015   MCV 88.2 11/20/2015   PLT 133* 11/20/2015   NEUTROABS 6.2 11/20/2015    Imaging:  Ct Chest W Contrast  11/19/2015  CLINICAL DATA:  65 year old female with history of pancreatic cancer. RECIST protocol. EXAM: CT CHEST, ABDOMEN, AND PELVIS WITH CONTRAST TECHNIQUE: Multidetector CT imaging of the chest, abdomen and pelvis was performed following the standard protocol during bolus administration of intravenous contrast. CONTRAST:  168mL ISOVUE-300 IOPAMIDOL (ISOVUE-300) INJECTION 61% COMPARISON:  CT the chest, abdomen and pelvis 09/09/2015. FINDINGS: RECIST 1.1 Target Lesions: 1. Hypovascular mass in the distal body and tail of the pancreas appears slightly smaller than the prior examination,  currently measuring 2.7 x 5.0 cm (previously 3.2 x 6.2 cm on prior 09/09/2015. 2. Index hypovascular lesion at junction of segments 7 and 8 in the liver is slightly smaller than the prior study, currently measuring 2.1 x 2.0 cm (image 56 of series 4), previously 2.2 x 2.5 cm. Non-target Lesions: 1. Worsening upper abdominal and retroperitoneal lymphadenopathy, most notable for a 1.3 cm short axis left para-aortic lymph node (image 60 of series 4). 2. Unchanged gastrohepatic ligament lymph node measuring 1 cm in short axis (image 56 of series 4). CT CHEST FINDINGS Mediastinum/Lymph Nodes: Heart size is normal. There is no significant pericardial fluid, thickening or pericardial calcification. Borderline enlarged right paratracheal lymph node measuring 8 mm in short axis is slightly smaller than the prior study and nonspecific. No pathologically enlarged mediastinal or hilar lymph nodes. Esophagus is unremarkable in appearance. No axillary lymphadenopathy. Right internal jugular single-lumen porta cath with tip terminating in the superior aspect of the right atrium. Lungs/Pleura: There again innumerable tiny 2-3 mm pulmonary nodules scattered throughout the lungs bilaterally, unchanged in size, number and distribution, presumably benign. No new larger suspicious appearing pulmonary nodules or masses are otherwise noted. Areas of cylindrical bronchiectasis, thickening of the peribronchovascular interstitium and associated architectural distortion with volume loss in the inferior segment of the lingula and medial segment of the right middle lobe are again noted, compatible with areas of chronic post infectious or inflammatory scarring. No acute consolidative airspace disease. No pleural effusions. Musculoskeletal/Soft Tissues: There are no aggressive appearing lytic or blastic lesions noted in the visualized portions of the skeleton. CT ABDOMEN AND PELVIS FINDINGS Hepatobiliary: Previously described index hypovascular  lesion at junction of segments 7 and 8 in the liver is slightly smaller than the prior study, currently measuring 2.1 x 2.0 cm (image 56 of series 4), previously 2.2 x 2.5 cm. The other previously noted hypovascular hepatic lesions are also smaller or have completely resolved compared to the prior examination. The best example of this is a lesion that was previously located between segments 4B and 5 (images 58 and 59 of series 2 of prior study 09/09/2015), which is now completely nonvisualized (image 60 of series 4 of today's exam). No new hepatic lesions are noted. No intra or extrahepatic biliary ductal dilatation. Gallbladder wall edema. No definite gallstones. No definite pericholecystic fluid or inflammatory changes. Pancreas: Hypovascular mass in the distal body and tail of the pancreas appears slightly smaller than the prior examination, currently measuring 2.7 x 5.0 cm (previously 3.2 x 6.2 cm on prior 09/09/2015). This mass is again intimately associated with the splenic vein which appears chronically occluded. This mass appears separate from the superior mesenteric vein and superior mesenteric artery. This is mass extends all the way to the level of the splenic hilum, without definite direct splenic invasion. The posterior wall of the mass comes in close proximity to the left adrenal gland, without definite invasion. The celiac axis and its main branches appear intimately associated with the superior aspect of the lesion, where these vessels are encased. At this time, the celiac axis, splenic vein and common hepatic artery appear patent. The left gastric artery is not confidently identified. Spleen: Unremarkable. Adrenals/Urinary Tract: Bilateral adrenal glands and bilateral kidneys are normal in appearance. No hydroureteronephrosis. Urinary bladder is normal in appearance. Stomach/Bowel: The appearance of the stomach is normal. No pathologic dilatation of small bowel or colon. A few scattered colonic  diverticulae are noted, particularly in the sigmoid colon, without definite surrounding inflammatory changes to suggest an acute diverticulitis at this time. Normal appendix. Vascular/Lymphatic: Vascular findings pertinent to the known pancreatic cancer, as discussed above. Atherosclerosis throughout the abdominal and pelvic vasculature, without definite aneurysm or dissection. Worsening upper abdominal and retroperitoneal lymphadenopathy, most notable for a 1.3 cm short axis left para-aortic lymph node (image 60 of series 4). This left para-aortic lymphadenopathy is part of multiple enlarged and borderline enlarged lymph nodes in the upper abdomen, intimately associated with the celiac axis and its branches, as discussed above. This is best visualized on images 58-60 of series 4, increased in overall bulk compared to the prior examination. Mildly enlarged gastrohepatic ligament lymph node measuring 1 cm in short axis is unchanged (image 56 of series 4). There is also a generalized haziness throughout the root of the small bowel mesentery, suggesting some lymphatic congestion. Multiple prominent but nonenlarged mesenteric lymph nodes are noted, but are nonspecific. Reproductive: Previously noted enhancing lesion in the uterine fundus is no longer identified. Uterus and ovaries are unremarkable in appearance on today's examination. Other: Trace volume of ascites predominantly in the low anatomic pelvis. No pneumoperitoneum. Musculoskeletal: There are no aggressive appearing lytic or blastic lesions noted in the visualized portions of the skeleton. IMPRESSION: 1. Today's study demonstrates a mixed response to therapy. Specifically, while the primary lesion in the distal body and tail of the pancreas is smaller than the prior study, as are the numerous hepatic metastases, there is worsening upper abdominal and retroperitoneal lymphadenopathy, as detailed above. 2. Previously described tiny pulmonary nodules are stable  in size compared to the prior examination, favored to be benign. 3. Trace volume of ascites. 4. Additional  incidental findings, as above. Electronically Signed   By: Vinnie Langton M.D.   On: 11/19/2015 12:34   Ct Abdomen Pelvis W Contrast  11/19/2015  CLINICAL DATA:  65 year old female with history of pancreatic cancer. RECIST protocol. EXAM: CT CHEST, ABDOMEN, AND PELVIS WITH CONTRAST TECHNIQUE: Multidetector CT imaging of the chest, abdomen and pelvis was performed following the standard protocol during bolus administration of intravenous contrast. CONTRAST:  136mL ISOVUE-300 IOPAMIDOL (ISOVUE-300) INJECTION 61% COMPARISON:  CT the chest, abdomen and pelvis 09/09/2015. FINDINGS: RECIST 1.1 Target Lesions: 1. Hypovascular mass in the distal body and tail of the pancreas appears slightly smaller than the prior examination, currently measuring 2.7 x 5.0 cm (previously 3.2 x 6.2 cm on prior 09/09/2015. 2. Index hypovascular lesion at junction of segments 7 and 8 in the liver is slightly smaller than the prior study, currently measuring 2.1 x 2.0 cm (image 56 of series 4), previously 2.2 x 2.5 cm. Non-target Lesions: 1. Worsening upper abdominal and retroperitoneal lymphadenopathy, most notable for a 1.3 cm short axis left para-aortic lymph node (image 60 of series 4). 2. Unchanged gastrohepatic ligament lymph node measuring 1 cm in short axis (image 56 of series 4). CT CHEST FINDINGS Mediastinum/Lymph Nodes: Heart size is normal. There is no significant pericardial fluid, thickening or pericardial calcification. Borderline enlarged right paratracheal lymph node measuring 8 mm in short axis is slightly smaller than the prior study and nonspecific. No pathologically enlarged mediastinal or hilar lymph nodes. Esophagus is unremarkable in appearance. No axillary lymphadenopathy. Right internal jugular single-lumen porta cath with tip terminating in the superior aspect of the right atrium. Lungs/Pleura: There again  innumerable tiny 2-3 mm pulmonary nodules scattered throughout the lungs bilaterally, unchanged in size, number and distribution, presumably benign. No new larger suspicious appearing pulmonary nodules or masses are otherwise noted. Areas of cylindrical bronchiectasis, thickening of the peribronchovascular interstitium and associated architectural distortion with volume loss in the inferior segment of the lingula and medial segment of the right middle lobe are again noted, compatible with areas of chronic post infectious or inflammatory scarring. No acute consolidative airspace disease. No pleural effusions. Musculoskeletal/Soft Tissues: There are no aggressive appearing lytic or blastic lesions noted in the visualized portions of the skeleton. CT ABDOMEN AND PELVIS FINDINGS Hepatobiliary: Previously described index hypovascular lesion at junction of segments 7 and 8 in the liver is slightly smaller than the prior study, currently measuring 2.1 x 2.0 cm (image 56 of series 4), previously 2.2 x 2.5 cm. The other previously noted hypovascular hepatic lesions are also smaller or have completely resolved compared to the prior examination. The best example of this is a lesion that was previously located between segments 4B and 5 (images 58 and 59 of series 2 of prior study 09/09/2015), which is now completely nonvisualized (image 60 of series 4 of today's exam). No new hepatic lesions are noted. No intra or extrahepatic biliary ductal dilatation. Gallbladder wall edema. No definite gallstones. No definite pericholecystic fluid or inflammatory changes. Pancreas: Hypovascular mass in the distal body and tail of the pancreas appears slightly smaller than the prior examination, currently measuring 2.7 x 5.0 cm (previously 3.2 x 6.2 cm on prior 09/09/2015). This mass is again intimately associated with the splenic vein which appears chronically occluded. This mass appears separate from the superior mesenteric vein and  superior mesenteric artery. This is mass extends all the way to the level of the splenic hilum, without definite direct splenic invasion. The posterior wall of the mass  comes in close proximity to the left adrenal gland, without definite invasion. The celiac axis and its main branches appear intimately associated with the superior aspect of the lesion, where these vessels are encased. At this time, the celiac axis, splenic vein and common hepatic artery appear patent. The left gastric artery is not confidently identified. Spleen: Unremarkable. Adrenals/Urinary Tract: Bilateral adrenal glands and bilateral kidneys are normal in appearance. No hydroureteronephrosis. Urinary bladder is normal in appearance. Stomach/Bowel: The appearance of the stomach is normal. No pathologic dilatation of small bowel or colon. A few scattered colonic diverticulae are noted, particularly in the sigmoid colon, without definite surrounding inflammatory changes to suggest an acute diverticulitis at this time. Normal appendix. Vascular/Lymphatic: Vascular findings pertinent to the known pancreatic cancer, as discussed above. Atherosclerosis throughout the abdominal and pelvic vasculature, without definite aneurysm or dissection. Worsening upper abdominal and retroperitoneal lymphadenopathy, most notable for a 1.3 cm short axis left para-aortic lymph node (image 60 of series 4). This left para-aortic lymphadenopathy is part of multiple enlarged and borderline enlarged lymph nodes in the upper abdomen, intimately associated with the celiac axis and its branches, as discussed above. This is best visualized on images 58-60 of series 4, increased in overall bulk compared to the prior examination. Mildly enlarged gastrohepatic ligament lymph node measuring 1 cm in short axis is unchanged (image 56 of series 4). There is also a generalized haziness throughout the root of the small bowel mesentery, suggesting some lymphatic congestion. Multiple  prominent but nonenlarged mesenteric lymph nodes are noted, but are nonspecific. Reproductive: Previously noted enhancing lesion in the uterine fundus is no longer identified. Uterus and ovaries are unremarkable in appearance on today's examination. Other: Trace volume of ascites predominantly in the low anatomic pelvis. No pneumoperitoneum. Musculoskeletal: There are no aggressive appearing lytic or blastic lesions noted in the visualized portions of the skeleton. IMPRESSION: 1. Today's study demonstrates a mixed response to therapy. Specifically, while the primary lesion in the distal body and tail of the pancreas is smaller than the prior study, as are the numerous hepatic metastases, there is worsening upper abdominal and retroperitoneal lymphadenopathy, as detailed above. 2. Previously described tiny pulmonary nodules are stable in size compared to the prior examination, favored to be benign. 3. Trace volume of ascites. 4. Additional incidental findings, as above. Electronically Signed   By: Vinnie Langton M.D.   On: 11/19/2015 12:34    Medications: I have reviewed the patient's current medications.  Assessment/Plan: 1. Pancreas cancer  MRI abdomen 08/27/2015 revealed a pancreas mass, adenopathy, and liver metastases  CTs 09/10/2015 with pancreas body/tail mass, liver metastases, lymphadenopathy, innumerable tiny pulmonary nodules  Ultrasound-guided biopsy of a right liver lesion 09/10/2015 confirmed adenocarcinoma  Enrollment on the SWOG S1313 study, randomized to the standard FOLFIRINOX arm  Cycle 1 FOLFIRINOX 09/25/2015  Cycle 2 FOLFIRINOX 10/09/2015  Cycle 3 FOLFIRINOX 10/23/2015  Cycle 4 FOLFIRINOX 11/05/2015   CT 11/19/2015- decrease in the size of liver lesions and the pancreas mass, ? Increased upper abdominal/retroperitoneal adenopathy  Cycle 5 FOLFIRINOX 11/20/2015  2. Pain secondary to #1, improved  3. History of constipation-likely secondary to  narcotics   Disposition: Ms. Meehl appears stable. She has completed 4 cycles of FOLFIRINOX. The recent restaging CT evaluation shows stable to improved disease. The pancreas mass is smaller. Liver lesions are also smaller. Dr. Benay Spice reviewed the CT images with a radiologist. The upper abdominal and retroperitoneal lymphadenopathy read as "worsening" does not appear to be definitely increased. Dr. Benay Spice  recommends proceeding with cycle 5 FOLFIRINOX today as scheduled. She will return for a follow-up visit and cycle 6 in 2 weeks. She will contact the office in the interim with any problems.  ECOG performance status measured at 1.  Patient seen with Dr. Benay Spice. 25 minutes were spent face-to-face at today's visit with the majority of that time involved in counseling/coordination of care. Dr. Benay Spice reviewed the CT images on the computer with Ms. Opitz and her daughters.  Ned Card ANP/GNP-BC   11/20/2015  10:08 AM   this was a shared visit with Ned Card. I reviewed the restaging CT scans with Ms. Lobley and her family. The liver lesions and primary pancreas mass have decreased in size. We presented her case at the GI tumor conference earlier today Lelon Frohlich could not appreciate a significant change in the peripancreatic lymphadenopathy.   I recommend continuing FOLFIRINOX. She is in agreement.   Julieanne Manson, M.D.

## 2015-11-20 NOTE — Progress Notes (Signed)
Oncology Nurse Navigator Documentation  Oncology Nurse Navigator Flowsheets 11/20/2015  Navigator Location [facility]  Navigator Encounter Type Treatment  Telephone -  Abnormal Finding Date -  Confirmed Diagnosis Date -  Treatment Initiated Date -  Patient Visit Type MedOnc  Treatment Phase Active Tx  Barriers/Navigation Needs No barriers at this time;No Questions;No Needs  Education -  Interventions None required--supportive listening  Education Method -  Support Groups/Services -  Acuity Level 1  Time Spent with Patient 15  She is still awaiting to hear from her accountant if she should file for SSD or continue to draw her husband's check. Today is her birthday and she is happy w/CT results. Is hoping she can go the entire summer without any chemotherapy. Suggested she discuss this with Dr. Benay Spice and the trial nurse.

## 2015-11-20 NOTE — Patient Instructions (Signed)
[facility] Discharge Instructions for Patients Receiving Chemotherapy  Today you received the following chemotherapy agents: Oxaliplatin, Luecovorin, Irinotecan and Adrucil   To help prevent nausea and vomiting after your treatment, we encourage you to take your nausea medication as directed.    If you develop nausea and vomiting that is not controlled by your nausea medication, call the clinic.   BELOW ARE SYMPTOMS THAT SHOULD BE REPORTED IMMEDIATELY:  *FEVER GREATER THAN 100.5 F  *CHILLS WITH OR WITHOUT FEVER  NAUSEA AND VOMITING THAT IS NOT CONTROLLED WITH YOUR NAUSEA MEDICATION  *UNUSUAL SHORTNESS OF BREATH  *UNUSUAL BRUISING OR BLEEDING  TENDERNESS IN MOUTH AND THROAT WITH OR WITHOUT PRESENCE OF ULCERS  *URINARY PROBLEMS  *BOWEL PROBLEMS  UNUSUAL RASH Items with * indicate a potential emergency and should be followed up as soon as possible.  Feel free to call the clinic you have any questions or concerns. The clinic phone number is [phone].  Please show the [facility] at check-in to the Emergency Department and triage nurse.

## 2015-11-20 NOTE — Progress Notes (Signed)
Nutrition follow-up completed with patient during infusion for pancreas cancer. Patient reports he feels well today and received good news. Weight continues to decrease and was documented as 121.4 pounds March 22, down from 123.1 pounds. Patient is trying to drink one oral nutrition supplement daily. Patient still struggles on some days to eat more.  Nutrition diagnosis: Food and nutrition related knowledge deficit improved.  Intervention: Encouraged patient to drink one Ensure Plus/ensure enlive plus one Carnation breakfast daily in addition to meals. Encouraged patient to focus on high-calorie, high-protein foods. Teach back method used.  Monitoring, evaluation, goals: Patient will tolerate increased calories and protein to minimize further weight loss.  Next visit: Wednesday, April 19, during infusion.  **Disclaimer: This note was dictated with voice recognition software. Similar sounding words can inadvertently be transcribed and this note may contain transcription errors which may not have been corrected upon publication of note.**

## 2015-11-21 LAB — CANCER ANTIGEN 19-9: CAN 19-9: 1 U/mL (ref 0–35)

## 2015-11-22 ENCOUNTER — Ambulatory Visit: Payer: Medicare Other

## 2015-11-22 ENCOUNTER — Ambulatory Visit (HOSPITAL_BASED_OUTPATIENT_CLINIC_OR_DEPARTMENT_OTHER): Payer: Medicare Other

## 2015-11-22 VITALS — BP 141/65 | HR 99 | Temp 98.3°F | Resp 18

## 2015-11-22 DIAGNOSIS — C787 Secondary malignant neoplasm of liver and intrahepatic bile duct: Secondary | ICD-10-CM

## 2015-11-22 DIAGNOSIS — C252 Malignant neoplasm of tail of pancreas: Secondary | ICD-10-CM

## 2015-11-22 DIAGNOSIS — Z006 Encounter for examination for normal comparison and control in clinical research program: Secondary | ICD-10-CM

## 2015-11-22 MED ORDER — HEPARIN SOD (PORK) LOCK FLUSH 100 UNIT/ML IV SOLN
500.0000 [IU] | Freq: Once | INTRAVENOUS | Status: AC | PRN
  Administered 2015-11-22: 500 [IU]
  Filled 2015-11-22: qty 5

## 2015-11-22 MED ORDER — PEGFILGRASTIM INJECTION 6 MG/0.6ML ~~LOC~~
6.0000 mg | PREFILLED_SYRINGE | Freq: Once | SUBCUTANEOUS | Status: AC
  Administered 2015-11-22: 6 mg via SUBCUTANEOUS
  Filled 2015-11-22: qty 0.6

## 2015-11-22 MED ORDER — SODIUM CHLORIDE 0.9 % IJ SOLN
10.0000 mL | INTRAMUSCULAR | Status: DC | PRN
  Administered 2015-11-22: 10 mL
  Filled 2015-11-22: qty 10

## 2015-11-22 NOTE — Patient Instructions (Addendum)
Implanted Port Home Guide An implanted port is a type of central line that is placed under the skin. Central lines are used to provide IV access when treatment or nutrition needs to be given through a person's veins. Implanted ports are used for long-term IV access. An implanted port may be placed because:   You need IV medicine that would be irritating to the small veins in your hands or arms.   You need long-term IV medicines, such as antibiotics.   You need IV nutrition for a long period.   You need frequent blood draws for lab tests.   You need dialysis.  Implanted ports are usually placed in the chest area, but they can also be placed in the upper arm, the abdomen, or the leg. An implanted port has two main parts:   Reservoir. The reservoir is round and will appear as a small, raised area under your skin. The reservoir is the part where a needle is inserted to give medicines or draw blood.   Catheter. The catheter is a thin, flexible tube that extends from the reservoir. The catheter is placed into a large vein. Medicine that is inserted into the reservoir goes into the catheter and then into the vein.  HOW WILL I CARE FOR MY INCISION SITE? Do not get the incision site wet. Bathe or shower as directed by your health care provider.  HOW IS MY PORT ACCESSED? Special steps must be taken to access the port:   Before the port is accessed, a numbing cream can be placed on the skin. This helps numb the skin over the port site.   Your health care provider uses a sterile technique to access the port.  Your health care provider must put on a mask and sterile gloves.  The skin over your port is cleaned carefully with an antiseptic and allowed to dry.  The port is gently pinched between sterile gloves, and a needle is inserted into the port.  Only "non-coring" port needles should be used to access the port. Once the port is accessed, a blood return should be checked. This helps  ensure that the port is in the vein and is not clogged.   If your port needs to remain accessed for a constant infusion, a clear (transparent) bandage will be placed over the needle site. The bandage and needle will need to be changed every week, or as directed by your health care provider.   Keep the bandage covering the needle clean and dry. Do not get it wet. Follow your health care provider's instructions on how to take a shower or bath while the port is accessed.   If your port does not need to stay accessed, no bandage is needed over the port.  WHAT IS FLUSHING? Flushing helps keep the port from getting clogged. Follow your health care provider's instructions on how and when to flush the port. Ports are usually flushed with saline solution or a medicine called heparin. The need for flushing will depend on how the port is used.   If the port is used for intermittent medicines or blood draws, the port will need to be flushed:   After medicines have been given.   After blood has been drawn.   As part of routine maintenance.   If a constant infusion is running, the port may not need to be flushed.  HOW LONG WILL MY PORT STAY IMPLANTED? The port can stay in for as long as your health care   provider thinks it is needed. When it is time for the port to come out, surgery will be done to remove it. The procedure is similar to the one performed when the port was put in.  WHEN SHOULD I SEEK IMMEDIATE MEDICAL CARE? When you have an implanted port, you should seek immediate medical care if:   You notice a bad smell coming from the incision site.   You have swelling, redness, or drainage at the incision site.   You have more swelling or pain at the port site or the surrounding area.   You have a fever that is not controlled with medicine.   This information is not intended to replace advice given to you by your health care provider. Make sure you discuss any questions you have with  your health care provider.   Document Released: 08/17/2005 Document Revised: 06/07/2013 Document Reviewed: 04/24/2013 Elsevier Interactive Patient Education 2016 [employer]. Pegfilgrastim injection What is this medicine? PEGFILGRASTIM (PEG fil gra stim) is a long-acting granulocyte colony-stimulating factor that stimulates the growth of neutrophils, a type of white blood cell important in the body's fight against infection. It is used to reduce the incidence of fever and infection in patients with certain types of cancer who are receiving chemotherapy that affects the bone marrow, and to increase survival after being exposed to high doses of radiation. This medicine may be used for other purposes; ask your health care provider or pharmacist if you have questions. What should I tell my health care provider before I take this medicine? They need to know if you have any of these conditions: -kidney disease -latex allergy -ongoing radiation therapy -sickle cell disease -skin reactions to acrylic adhesives (On-Body Injector only) -an unusual or allergic reaction to pegfilgrastim, filgrastim, other medicines, foods, dyes, or preservatives -pregnant or trying to get pregnant -breast-feeding How should I use this medicine? This medicine is for injection under the skin. If you get this medicine at home, you will be taught how to prepare and give the pre-filled syringe or how to use the On-body Injector. Refer to the patient Instructions for Use for detailed instructions. Use exactly as directed. Take your medicine at regular intervals. Do not take your medicine more often than directed. It is important that you put your used needles and syringes in a special sharps container. Do not put them in a trash can. If you do not have a sharps container, call your pharmacist or healthcare provider to get one. Talk to your pediatrician regarding the use of this medicine in children. While this drug may be  prescribed for selected conditions, precautions do apply. Overdosage: If you think you have taken too much of this medicine contact a poison control center or emergency room at once. NOTE: This medicine is only for you. Do not share this medicine with others. What if I miss a dose? It is important not to miss your dose. Call your doctor or health care professional if you miss your dose. If you miss a dose due to an On-body Injector failure or leakage, a new dose should be administered as soon as possible using a single prefilled syringe for manual use. What may interact with this medicine? Interactions have not been studied. Give your health care provider a list of all the medicines, herbs, non-prescription drugs, or dietary supplements you use. Also tell them if you smoke, drink alcohol, or use illegal drugs. Some items may interact with your medicine. This list may not describe all possible   interactions. Give your health care provider a list of all the medicines, herbs, non-prescription drugs, or dietary supplements you use. Also tell them if you smoke, drink alcohol, or use illegal drugs. Some items may interact with your medicine. What should I watch for while using this medicine? You may need blood work done while you are taking this medicine. If you are going to need a MRI, CT scan, or other procedure, tell your doctor that you are using this medicine (On-Body Injector only). What side effects may I notice from receiving this medicine? Side effects that you should report to your doctor or health care professional as soon as possible: -allergic reactions like skin rash, itching or hives, swelling of the face, lips, or tongue -dizziness -fever -pain, redness, or irritation at site where injected -pinpoint red spots on the skin -red or dark-brown urine -shortness of breath or breathing problems -stomach or side pain, or pain at the shoulder -swelling -tiredness -trouble passing urine or  change in the amount of urine Side effects that usually do not require medical attention (report to your doctor or health care professional if they continue or are bothersome): -bone pain -muscle pain This list may not describe all possible side effects. Call your doctor for medical advice about side effects. You may report side effects to FDA at 1-800-FDA-1088. Where should I keep my medicine? Keep out of the reach of children. Store pre-filled syringes in a refrigerator between 2 and 8 degrees C (36 and 46 degrees F). Do not freeze. Keep in carton to protect from light. Throw away this medicine if it is left out of the refrigerator for more than 48 hours. Throw away any unused medicine after the expiration date. NOTE: This sheet is a summary. It may not cover all possible information. If you have questions about this medicine, talk to your doctor, pharmacist, or health care provider.    2016, Elsevier/Gold Standard. (2014-09-06 14:30:14)  

## 2015-11-22 NOTE — Progress Notes (Signed)
Neulasta injection given by infusion nurse after home infusion pump disconnected 

## 2015-11-25 ENCOUNTER — Telehealth: Payer: Self-pay | Admitting: *Deleted

## 2015-11-25 NOTE — Telephone Encounter (Signed)
  Oncology Nurse Navigator Documentation  Navigator Location: [facility] (11/25/15 1734) Navigator Encounter Type: Telephone (11/25/15 1734) Telephone: Outgoing Call;Patient Update (11/25/15 1734)    Daughter had sent email asking if it would be ok for patient to have acupuncture. Per Dr. Benay Spice, [place]. Called and left VM for patient with this info and that we will provide a note regarding this clearance when she is in office on 12/04/15 and will provide list of couple places that patients have reported a good experience with. Requested she let Esmond Camper know I had called her with this information.                                    Time Spent with Patient: 15 (11/25/15 1734)

## 2015-11-28 ENCOUNTER — Other Ambulatory Visit: Payer: Self-pay | Admitting: Oncology

## 2015-12-01 ENCOUNTER — Other Ambulatory Visit: Payer: Self-pay | Admitting: Oncology

## 2015-12-04 ENCOUNTER — Encounter: Payer: Self-pay | Admitting: *Deleted

## 2015-12-04 ENCOUNTER — Other Ambulatory Visit (HOSPITAL_BASED_OUTPATIENT_CLINIC_OR_DEPARTMENT_OTHER): Payer: Medicare Other

## 2015-12-04 ENCOUNTER — Telehealth: Payer: Self-pay | Admitting: Oncology

## 2015-12-04 ENCOUNTER — Ambulatory Visit: Payer: Medicare Other

## 2015-12-04 ENCOUNTER — Other Ambulatory Visit: Payer: Self-pay | Admitting: Nurse Practitioner

## 2015-12-04 ENCOUNTER — Ambulatory Visit (HOSPITAL_BASED_OUTPATIENT_CLINIC_OR_DEPARTMENT_OTHER): Payer: Medicare Other | Admitting: Oncology

## 2015-12-04 ENCOUNTER — Ambulatory Visit (HOSPITAL_BASED_OUTPATIENT_CLINIC_OR_DEPARTMENT_OTHER): Payer: Medicare Other

## 2015-12-04 ENCOUNTER — Ambulatory Visit (HOSPITAL_BASED_OUTPATIENT_CLINIC_OR_DEPARTMENT_OTHER): Payer: Self-pay | Admitting: Nurse Practitioner

## 2015-12-04 VITALS — BP 126/55 | HR 98 | Temp 98.1°F | Resp 20 | Ht 62.0 in | Wt 120.8 lb

## 2015-12-04 DIAGNOSIS — C252 Malignant neoplasm of tail of pancreas: Secondary | ICD-10-CM | POA: Diagnosis not present

## 2015-12-04 DIAGNOSIS — C787 Secondary malignant neoplasm of liver and intrahepatic bile duct: Secondary | ICD-10-CM | POA: Diagnosis not present

## 2015-12-04 DIAGNOSIS — Z006 Encounter for examination for normal comparison and control in clinical research program: Secondary | ICD-10-CM

## 2015-12-04 DIAGNOSIS — G620 Drug-induced polyneuropathy: Secondary | ICD-10-CM | POA: Diagnosis not present

## 2015-12-04 DIAGNOSIS — G893 Neoplasm related pain (acute) (chronic): Secondary | ICD-10-CM | POA: Diagnosis not present

## 2015-12-04 DIAGNOSIS — T7840XA Allergy, unspecified, initial encounter: Secondary | ICD-10-CM

## 2015-12-04 DIAGNOSIS — Z95828 Presence of other vascular implants and grafts: Secondary | ICD-10-CM

## 2015-12-04 DIAGNOSIS — Z5111 Encounter for antineoplastic chemotherapy: Secondary | ICD-10-CM

## 2015-12-04 LAB — CBC WITH DIFFERENTIAL/PLATELET
BASO%: 0.8 % (ref 0.0–2.0)
Basophils Absolute: 0.1 10*3/uL (ref 0.0–0.1)
EOS ABS: 0.4 10*3/uL (ref 0.0–0.5)
EOS%: 4.2 % (ref 0.0–7.0)
HEMATOCRIT: 30.9 % — AB (ref 34.8–46.6)
HEMOGLOBIN: 9.8 g/dL — AB (ref 11.6–15.9)
LYMPH#: 1.8 10*3/uL (ref 0.9–3.3)
LYMPH%: 17.3 % (ref 14.0–49.7)
MCH: 27.8 pg (ref 25.1–34.0)
MCHC: 31.6 g/dL (ref 31.5–36.0)
MCV: 88 fL (ref 79.5–101.0)
MONO#: 1.1 10*3/uL — AB (ref 0.1–0.9)
MONO%: 10.4 % (ref 0.0–14.0)
NEUT%: 67.3 % (ref 38.4–76.8)
NEUTROS ABS: 6.8 10*3/uL — AB (ref 1.5–6.5)
Platelets: 135 10*3/uL — ABNORMAL LOW (ref 145–400)
RBC: 3.52 10*6/uL — ABNORMAL LOW (ref 3.70–5.45)
RDW: 23.4 % — ABNORMAL HIGH (ref 11.2–14.5)
WBC: 10.1 10*3/uL (ref 3.9–10.3)

## 2015-12-04 LAB — COMPREHENSIVE METABOLIC PANEL
ALT: 27 U/L (ref 0–55)
AST: 27 U/L (ref 5–34)
Albumin: 2.8 g/dL — ABNORMAL LOW (ref 3.5–5.0)
Alkaline Phosphatase: 195 U/L — ABNORMAL HIGH (ref 40–150)
Anion Gap: 8 mEq/L (ref 3–11)
BUN: 10.4 mg/dL (ref 7.0–26.0)
CALCIUM: 8.9 mg/dL (ref 8.4–10.4)
CHLORIDE: 106 meq/L (ref 98–109)
CO2: 28 meq/L (ref 22–29)
CREATININE: 0.8 mg/dL (ref 0.6–1.1)
EGFR: 77 mL/min/{1.73_m2} — ABNORMAL LOW (ref >90)
Glucose: 133 mg/dl (ref 70–140)
Potassium: 3.6 mEq/L (ref 3.5–5.1)
Sodium: 142 mEq/L (ref 136–145)
TOTAL PROTEIN: 5.6 g/dL — AB (ref 6.4–8.3)

## 2015-12-04 MED ORDER — OXALIPLATIN CHEMO INJECTION 100 MG/20ML
85.0000 mg/m2 | Freq: Once | INTRAVENOUS | Status: AC
  Administered 2015-12-04: 140 mg via INTRAVENOUS
  Filled 2015-12-04: qty 8

## 2015-12-04 MED ORDER — DEXTROSE 5 % IV SOLN
Freq: Once | INTRAVENOUS | Status: AC
  Administered 2015-12-04: 14:00:00 via INTRAVENOUS

## 2015-12-04 MED ORDER — PALONOSETRON HCL INJECTION 0.25 MG/5ML
0.2500 mg | Freq: Once | INTRAVENOUS | Status: AC
  Administered 2015-12-04: 0.25 mg via INTRAVENOUS

## 2015-12-04 MED ORDER — IRINOTECAN HCL CHEMO INJECTION 100 MG/5ML
180.0000 mg/m2 | Freq: Once | INTRAVENOUS | Status: AC
  Administered 2015-12-04: 292 mg via INTRAVENOUS
  Filled 2015-12-04: qty 4.87

## 2015-12-04 MED ORDER — ATROPINE SULFATE 1 MG/ML IJ SOLN
0.5000 mg | Freq: Once | INTRAMUSCULAR | Status: AC | PRN
  Administered 2015-12-04: 0.5 mg via INTRAVENOUS

## 2015-12-04 MED ORDER — PALONOSETRON HCL INJECTION 0.25 MG/5ML
INTRAVENOUS | Status: AC
  Filled 2015-12-04: qty 5

## 2015-12-04 MED ORDER — SODIUM CHLORIDE 0.9 % IV SOLN
INTRAVENOUS | Status: DC
  Administered 2015-12-04 (×2): via INTRAVENOUS

## 2015-12-04 MED ORDER — HYDROMORPHONE HCL 4 MG PO TABS: 4.0000 mg | ORAL_TABLET | ORAL | Status: DC | PRN

## 2015-12-04 MED ORDER — ATROPINE SULFATE 1 MG/ML IJ SOLN
INTRAMUSCULAR | Status: AC
  Filled 2015-12-04: qty 1

## 2015-12-04 MED ORDER — SODIUM CHLORIDE 0.9 % IV SOLN
2400.0000 mg/m2 | INTRAVENOUS | Status: DC
  Administered 2015-12-04: 3900 mg via INTRAVENOUS
  Filled 2015-12-04: qty 78

## 2015-12-04 MED ORDER — LEUCOVORIN CALCIUM INJECTION 350 MG
400.0000 mg/m2 | Freq: Once | INTRAMUSCULAR | Status: AC
  Administered 2015-12-04: 648 mg via INTRAVENOUS
  Filled 2015-12-04: qty 32.4

## 2015-12-04 MED ORDER — SODIUM CHLORIDE 0.9 % IV SOLN
Freq: Once | INTRAVENOUS | Status: AC
  Administered 2015-12-04: 13:00:00 via INTRAVENOUS
  Filled 2015-12-04: qty 5

## 2015-12-04 MED ORDER — LORAZEPAM 0.5 MG PO TABS: 0.5000 mg | ORAL_TABLET | Freq: Three times a day (TID) | ORAL | Status: DC | PRN

## 2015-12-04 MED ORDER — HYDROCODONE-ACETAMINOPHEN 5-325 MG PO TABS: 1.0000 | ORAL_TABLET | Freq: Four times a day (QID) | ORAL | Status: DC | PRN

## 2015-12-04 MED ORDER — SODIUM CHLORIDE 0.9% FLUSH
10.0000 mL | INTRAVENOUS | Status: DC | PRN
  Administered 2015-12-04: 10 mL via INTRAVENOUS
  Filled 2015-12-04: qty 10

## 2015-12-04 NOTE — Progress Notes (Signed)
12/04/2015 [id number] Cycle 6, day 1 The patient arrived to clinic accompanied by her daughter.  Her vital signs and weight were obtained.  Her weight continues to be within the baseline dosing range which does not require a re-calculation of doses.  She continues with grade 1 weight loss.  The above was verified and checked with Geophysicist/field seismologist, V. Patent attorney and then later confirmed with pharmacist, Virgel Bouquet.  Geophysicist/field seismologist received communication today from [facility] to continue dosing as per institutional policy, which is the same as the [facility] policy for this study. The patient reports having had a pretty good last 2 weeks.  She feels more weakness and fatigue for several days after treatment and it is relieved by rest.  She was able to go out of town last week to her daughter's house for a few days.  She reports taking walks each day over the last week. She denies mouth sores and has slight burning, not interfering with eating.  She states her appetite is better.   Dr. Benay Spice was in to see and examine patient.  Dr. Benay Spice made aware the patient had a grade 1 infusion reaction last visit, on 11/20/15, which resolved after Oxaliplatin was paused for several minutes.  Per MD, nothing to change today, no new medications to give, continue treatment as planned and follow institutional protocol if reaction occurs.  This was confirmed , for protocol reasons, with second RN, Primary school teacher.   The patient also has a grade 2 Hgb today.  Per MD, continue to treat, nothing to change.  Kenton Kingfisher, Geophysicist/field seismologist confirmed with study chair, Dr. Kathaleen Bury, that the patient can continue treatment with today's Hgb as the study really does not specify in regards to Hgb. Based on all other labs from today and physical exam, MD cleared the patient to continue treatment. Infusion sign with instructions given to Royce Macadamia, RN.  She is aware of above instructions with regards to infusion reactions.  Dosing based on  today's weight confirmed and checked with Thayer Jew, pharmacist.   The patient understands to call for any questions or concerns.  This RN thanked the patient for her continued participation in the study.

## 2015-12-04 NOTE — Progress Notes (Signed)
1700-Pt states that her speech is beginning to slur and that her finger tips are tingling.  No other complaints at this time.  Michel Harrow NP to infusion room to assess patient, patient wishes to proceed with chemo at this time and not have any additional medications at this time.    1715-Pt states that her symptoms have subsided at this time.

## 2015-12-04 NOTE — Patient Instructions (Signed)

## 2015-12-04 NOTE — Telephone Encounter (Signed)
Gave and printed appt sched and avs for pt for April and May °

## 2015-12-04 NOTE — Patient Instructions (Signed)
[facility] Discharge Instructions for Patients Receiving Chemotherapy  Today you received the following chemotherapy agents: Oxaliplatin, Luecovorin, Irinotecan and Adrucil   To help prevent nausea and vomiting after your treatment, we encourage you to take your nausea medication as directed.    If you develop nausea and vomiting that is not controlled by your nausea medication, call the clinic.   BELOW ARE SYMPTOMS THAT SHOULD BE REPORTED IMMEDIATELY:  *FEVER GREATER THAN 100.5 F  *CHILLS WITH OR WITHOUT FEVER  NAUSEA AND VOMITING THAT IS NOT CONTROLLED WITH YOUR NAUSEA MEDICATION  *UNUSUAL SHORTNESS OF BREATH  *UNUSUAL BRUISING OR BLEEDING  TENDERNESS IN MOUTH AND THROAT WITH OR WITHOUT PRESENCE OF ULCERS  *URINARY PROBLEMS  *BOWEL PROBLEMS  UNUSUAL RASH Items with * indicate a potential emergency and should be followed up as soon as possible.  Feel free to call the clinic you have any questions or concerns. The clinic phone number is [phone].  Please show the [facility] at check-in to the Emergency Department and triage nurse.

## 2015-12-04 NOTE — Progress Notes (Signed)
  [facility] OFFICE PROGRESS NOTE   Diagnosis: Pancreas cancer  INTERVAL HISTORY:   She completed another treatment with FOLFIRINOX 11/20/2015. No diarrhea. No neuropathy symptoms. She has noted improvement in the abdomen/back pain. Her appetite has improved. She was able to go out of town last weekend. She had transient hand cramping, I twitching, and slurred speech during the chemotherapy. She was able to complete chemotherapy. No neuropathy symptoms at present. Objective:  Vital signs in last 24 hours:  Blood pressure 126/55, pulse 98, temperature 98.1 F (36.7 C), temperature source Oral, resp. rate 20, height 5\' 2"  (1.575 m), weight 120 lb 12.8 oz (54.795 kg), SpO2 98 %.    HEENT: No thrush or ulcers Resp: Lungs clear bilaterally Cardio: Regular rate and rhythm GI: No hepatomegaly, no mass, nontender Vascular: No leg edema Neuro: Mild loss of vibratory sense at the fingertips bilaterally   Portacath/PICC-without erythema  Lab Results:  Lab Results  Component Value Date   WBC 10.1 12/04/2015   HGB 9.8* 12/04/2015   HCT 30.9* 12/04/2015   MCV 88.0 12/04/2015   PLT 135* 12/04/2015   NEUTROABS 6.8* 12/04/2015     Medications: I have reviewed the patient's current medications.  Assessment/Plan: 1. Pancreas cancer  MRI abdomen 08/27/2015 revealed a pancreas mass, adenopathy, and liver metastases  CTs 09/10/2015 with pancreas body/tail mass, liver metastases, lymphadenopathy, innumerable tiny pulmonary nodules  Ultrasound-guided biopsy of a right liver lesion 09/10/2015 confirmed adenocarcinoma  Enrollment on the [facility] S1313 study, randomized to the standard FOLFIRINOX arm  Cycle 1 FOLFIRINOX 09/25/2015  Cycle 2 FOLFIRINOX 10/09/2015  Cycle 3 FOLFIRINOX 10/23/2015  Cycle 4 FOLFIRINOX 11/05/2015  CT 11/19/2015- decrease in the size of liver lesions and the pancreas mass, ? Increased upper abdominal/retroperitoneal adenopathy  Cycle 5  FOLFIRINOX 11/20/2015  Cycle 6 FOLFIRINOX 12/04/2015  2. Pain secondary to #1, improved  3. History of constipation-likely secondary to narcotics  4.   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 The plan is to proceed with cycle 6 FOLFIRINOX today. She will return for an office visit and chemotherapy in 2 weeks.  She does not have significant symptoms related to anemia. We contacted [facility] and they agree on proceeding with chemotherapy based on today's hemoglobin level. Her ECOG performance status is measured at 1 today.   Betsy Coder, MD  12/04/2015  11:41 AM

## 2015-12-04 NOTE — Progress Notes (Signed)
Oncology Nurse Navigator Documentation  Oncology Nurse Navigator Flowsheets 12/04/2015  Navigator Location [facility]  Navigator Encounter Type Treatment;3 month  Telephone -  Abnormal Finding Date -  Confirmed Diagnosis Date -  Treatment Initiated Date -  Patient Visit Type MedOnc  Treatment Phase Active Tx -FOLFIRINOX #6  Barriers/Navigation Needs Education  Education Understanding Cancer/ Treatment Options--asking about having 12 cycles of chemo and then what?  Interventions Education Method  Education Method Verbal  Support Groups/Services GI Support Group;Other--Living with Cancer Group  Acuity Level 1  Time Spent with Patient 15  Informed her that it is not uncommon toward the end of the treatment period there could be dose reduction in oxaliplatin or it would be held due to neuropathy. Afterwards, she may have a period of time in observation with labs/scans. However, she will be under treatment in some fashion indefinitely-this is the nature of her cancer. Encouraged her not to waste energy on worry and enjoy every day to its fullest. Met w/her in tx area today and she was eating a burger and fries from [employer]. Inquired if she still wanted to pursue acupuncture and she said no, that was [name]'s idea and she is not interested.

## 2015-12-06 ENCOUNTER — Ambulatory Visit (HOSPITAL_BASED_OUTPATIENT_CLINIC_OR_DEPARTMENT_OTHER): Payer: Medicare Other

## 2015-12-06 ENCOUNTER — Telehealth: Payer: Self-pay

## 2015-12-06 ENCOUNTER — Ambulatory Visit: Payer: Medicare Other

## 2015-12-06 VITALS — BP 131/60 | HR 77 | Temp 98.9°F | Resp 16

## 2015-12-06 DIAGNOSIS — C252 Malignant neoplasm of tail of pancreas: Secondary | ICD-10-CM

## 2015-12-06 DIAGNOSIS — Z006 Encounter for examination for normal comparison and control in clinical research program: Secondary | ICD-10-CM

## 2015-12-06 DIAGNOSIS — Z5189 Encounter for other specified aftercare: Secondary | ICD-10-CM

## 2015-12-06 MED ORDER — PEGFILGRASTIM INJECTION 6 MG/0.6ML ~~LOC~~
6.0000 mg | PREFILLED_SYRINGE | Freq: Once | SUBCUTANEOUS | Status: AC
  Administered 2015-12-06: 6 mg via SUBCUTANEOUS
  Filled 2015-12-06: qty 0.6

## 2015-12-06 MED ORDER — HEPARIN SOD (PORK) LOCK FLUSH 100 UNIT/ML IV SOLN
500.0000 [IU] | Freq: Once | INTRAVENOUS | Status: AC | PRN
  Administered 2015-12-06: 500 [IU]
  Filled 2015-12-06: qty 5

## 2015-12-06 MED ORDER — SODIUM CHLORIDE 0.9 % IJ SOLN
10.0000 mL | INTRAMUSCULAR | Status: DC | PRN
  Administered 2015-12-06: 10 mL
  Filled 2015-12-06: qty 10

## 2015-12-06 NOTE — Progress Notes (Signed)
Neulasta injection given by infusion nurse after home infusion pump disconnected 

## 2015-12-09 ENCOUNTER — Encounter: Payer: Self-pay | Admitting: Nurse Practitioner

## 2015-12-09 DIAGNOSIS — T7840XA Allergy, unspecified, initial encounter: Secondary | ICD-10-CM | POA: Insufficient documentation

## 2015-12-09 NOTE — Progress Notes (Signed)
SYMPTOM MANAGEMENT CLINIC    Chief Complaint: Hypersensitivity reaction   HPI:  Wendy Palmer 66 y.o. female diagnosed with pancreatic cancer; with liver metastasis.  Currently undergoing Folfirinox chemotherapy regimen.   Patient presented to the [facility] today to receive cycle 6 of her folfirinox chemotherapy regimen.    She experienced a mild reaction to the irinotecan portion of her chemotherapy today.  She was complaining of mild tingling and cramping to her fingers; and also felt that her tongue was slightly numb as well.  She denied any issues with her airway; with managing all secretions well.  She had no other symptoms whatsoever.  All chemotherapy infusion was held; and patient monitor closely.  All symptoms completely resolved; and patient was able to proceed with her chemotherapy today as planned.  Patient was advised to call/return to go directly to the emergency department for any worsening symptoms whatsoever.  Also, patient was given an instruction sheet regarding both Benadryl and Pepcid administration for any worsening symptoms as well.   No history exists.    ROS  Past Medical History  Diagnosis Date  . Diverticulosis   . Depression   . Patella fracture   . Hypertension     Past Surgical History  Procedure Laterality Date  . Tubal ligation    . Refractive surgery      has Rosacea; Essential hypertension; Hyperlipemia; Hot flashes, menopausal; Dyspnea; Cancer of pancreas, tail ([place]); Liver mass; and Hypersensitivity reaction on her problem list.    is allergic to macrobid and ace inhibitors.    Medication List       This list is accurate as of: 12/04/15 11:59 PM.  Always use your most recent med list.               acetaminophen-codeine 300-30 MG tablet  Commonly known as:  TYLENOL #3  TAKE 1 ABLET EVERY 4 HOURS AS NEEDED FOR COUGH     amLODipine 2.5 MG tablet  Commonly known as:  NORVASC  TAKE 1 TABLET (2.5 MG TOTAL) BY MOUTH DAILY.       aspirin 81 MG tablet  Take 81 mg by mouth daily. Reported on 09/11/2015     calcium carbonate 500 MG chewable tablet  Commonly known as:  TUMS - dosed in mg elemental calcium  Chew 1 tablet by mouth daily as needed for indigestion or heartburn.     docusate sodium 100 MG capsule  Commonly known as:  COLACE  Take 100 mg by mouth 2 (two) times daily.     famotidine 20 MG tablet  Commonly known as:  PEPCID  One at bedtime     HYDROcodone-acetaminophen 5-325 MG tablet  Commonly known as:  NORCO/VICODIN  Take 1 tablet by mouth every 6 (six) hours as needed for moderate pain. Take 1 tablet 4 times daily as needed     HYDROmorphone 4 MG tablet  Commonly known as:  DILAUDID  Take 1-2 tablets (4-8 mg total) by mouth every 4 (four) hours as needed for severe pain.     imipramine 50 MG tablet  Commonly known as:  TOFRANIL  Take 2 tablets (100 mg total) by mouth at bedtime.     lidocaine-prilocaine cream  Commonly known as:  EMLA  Apply to port site one hour prior to use. Do not rub in. Cover with plastic.     LORazepam 0.5 MG tablet  Commonly known as:  ATIVAN  Take 1 tablet (0.5 mg total) by mouth 3 (three)  times daily as needed for anxiety.     magnesium citrate solution  Take 148 mLs by mouth as directed. Try 1/2 bottle at bedtime and repeat next day if no BM     multivitamin tablet  Take 1 tablet by mouth daily. Reported on 09/11/2015     ondansetron 8 MG tablet  Commonly known as:  ZOFRAN  Take 1 tablet (8 mg total) by mouth every 8 (eight) hours as needed for nausea or vomiting.     polyethylene glycol packet  Commonly known as:  MIRALAX / GLYCOLAX  Take 17 g by mouth daily.     prochlorperazine 10 MG tablet  Commonly known as:  COMPAZINE  TAKE 1 TABLET (10 MG TOTAL) BY MOUTH EVERY 6 (SIX) HOURS AS NEEDED FOR NAUSEA OR VOMITING.         PHYSICAL EXAMINATION  Oncology Vitals 12/06/2015 12/04/2015  Height - 158 cm  Weight - 54.795 kg  Weight (lbs) - 120 lbs 13 oz   BMI (kg/m2) - 22.09 kg/m2  Temp 98.9 98.1  Pulse 77 98  Resp 16 20  SpO2 99 98  BSA (m2) - 1.55 m2   BP Readings from Last 2 Encounters:  12/06/15 131/60  12/04/15 126/55    Physical Exam  Constitutional: She is oriented to person, place, and time and well-developed, well-nourished, and in no distress.  HENT:  Head: Normocephalic and atraumatic.  Mouth/Throat: Oropharynx is clear and moist.  Eyes: Conjunctivae and EOM are normal. Pupils are equal, round, and reactive to light. Right eye exhibits no discharge. Left eye exhibits no discharge. No scleral icterus.  Neck: Normal range of motion.  Pulmonary/Chest: Effort normal. No stridor. No respiratory distress.  Neurological: She is alert and oriented to person, place, and time.  Patient was complaining of mild tingling and cramping of her fingers bilaterally and some numbness to her tongue-with all symptoms completely resolving with no medications.  Skin: No rash noted.  Nursing note and vitals reviewed.   LABORATORY DATA:. Appointment on 12/04/2015  Component Date Value Ref Range Status  . WBC 12/04/2015 10.1  3.9 - 10.3 10e3/uL Final  . NEUT# 12/04/2015 6.8* 1.5 - 6.5 10e3/uL Final  . HGB 12/04/2015 9.8* 11.6 - 15.9 g/dL Final  . HCT 12/04/2015 30.9* 34.8 - 46.6 % Final  . Platelets 12/04/2015 135* 145 - 400 10e3/uL Final  . MCV 12/04/2015 88.0  79.5 - 101.0 fL Final  . MCH 12/04/2015 27.8  25.1 - 34.0 pg Final  . MCHC 12/04/2015 31.6  31.5 - 36.0 g/dL Final  . RBC 12/04/2015 3.52* 3.70 - 5.45 10e6/uL Final  . RDW 12/04/2015 23.4* 11.2 - 14.5 % Final  . lymph# 12/04/2015 1.8  0.9 - 3.3 10e3/uL Final  . MONO# 12/04/2015 1.1* 0.1 - 0.9 10e3/uL Final  . Eosinophils Absolute 12/04/2015 0.4  0.0 - 0.5 10e3/uL Final  . Basophils Absolute 12/04/2015 0.1  0.0 - 0.1 10e3/uL Final  . NEUT% 12/04/2015 67.3  38.4 - 76.8 % Final  . LYMPH% 12/04/2015 17.3  14.0 - 49.7 % Final  . MONO% 12/04/2015 10.4  0.0 - 14.0 % Final  . EOS%  12/04/2015 4.2  0.0 - 7.0 % Final  . BASO% 12/04/2015 0.8  0.0 - 2.0 % Final  . Sodium 12/04/2015 142  136 - 145 mEq/L Final  . Potassium 12/04/2015 3.6  3.5 - 5.1 mEq/L Final  . Chloride 12/04/2015 106  98 - 109 mEq/L Final  . CO2 12/04/2015 28  22 -  29 mEq/L Final  . Glucose 12/04/2015 133  70 - 140 mg/dl Final   Glucose reference range is for nonfasting patients. Fasting glucose reference range is 70- 100.  Marland Kitchen BUN 12/04/2015 10.4  7.0 - 26.0 mg/dL Final  . Creatinine 12/04/2015 0.8  0.6 - 1.1 mg/dL Final  . Total Bilirubin 12/04/2015 <0.30  0.20 - 1.20 mg/dL Final  . Alkaline Phosphatase 12/04/2015 195* 40 - 150 U/L Final  . AST 12/04/2015 27  5 - 34 U/L Final  . ALT 12/04/2015 27  0 - 55 U/L Final  . Total Protein 12/04/2015 5.6* 6.4 - 8.3 g/dL Final  . Albumin 12/04/2015 2.8* 3.5 - 5.0 g/dL Final  . Calcium 12/04/2015 8.9  8.4 - 10.4 mg/dL Final  . Anion Gap 12/04/2015 8  3 - 11 mEq/L Final  . EGFR 12/04/2015 77* >90 ml/min/1.73 m2 Final   eGFR is calculated using the CKD-EPI Creatinine Equation (2009)    RADIOGRAPHIC STUDIES: No results found.  ASSESSMENT/PLAN:    Cancer of pancreas, tail [facility]) Patient presented to the [place] today to receive cycle 6 of her folfirinox chemotherapy regimen.    She experienced a mild reaction to the irinotecan portion of her chemotherapy today.  See further notes for details.  Patient is scheduled to return on 12/18/2015 for labs, flush, visit, and chemotherapy.  Hypersensitivity reaction Patient presented to the [facility] today to receive cycle 6 of her folfirinox chemotherapy regimen.    She experienced a mild reaction to the irinotecan portion of her chemotherapy today.  She was complaining of mild tingling and cramping to her fingers; and also felt that her tongue was slightly numb as well.  She denied any issues with her airway; with managing all secretions well.  She had no other symptoms whatsoever.  All chemotherapy  infusion was held; and patient monitor closely.  All symptoms completely resolved; and patient was able to proceed with her chemotherapy today as planned.  Patient was advised to call/return to go directly to the emergency department for any worsening symptoms whatsoever.  Also, patient was given an instruction sheet regarding both Benadryl and Pepcid administration for any worsening symptoms as well.  Patient stated understanding of all instructions; and was in agreement with this plan of care. The patient knows to call the clinic with any problems, questions or concerns.   Review/collaboration with Dr. Benay Spice regarding all aspects of patient's visit today.   Total time spent with patient was 15 minutes;  with greater than 75 percent of that time spent in face to face counseling regarding patient's symptoms,  and coordination of care and follow up.  Disclaimer:This dictation was prepared with Dragon/digital dictation along with [employer]. Any transcriptional errors that result from this process are unintentional.  Drue Second, NP 12/09/2015

## 2015-12-09 NOTE — Assessment & Plan Note (Signed)
Patient presented to the [facility] today to receive cycle 6 of her folfirinox chemotherapy regimen.    She experienced a mild reaction to the irinotecan portion of her chemotherapy today.  See further notes for details.  Patient is scheduled to return on 12/18/2015 for labs, flush, visit, and chemotherapy.

## 2015-12-09 NOTE — Assessment & Plan Note (Signed)
Patient presented to the [facility] today to receive cycle 6 of her folfirinox chemotherapy regimen.    She experienced a mild reaction to the irinotecan portion of her chemotherapy today.  She was complaining of mild tingling and cramping to her fingers; and also felt that her tongue was slightly numb as well.  She denied any issues with her airway; with managing all secretions well.  She had no other symptoms whatsoever.  All chemotherapy infusion was held; and patient monitor closely.  All symptoms completely resolved; and patient was able to proceed with her chemotherapy today as planned.  Patient was advised to call/return to go directly to the emergency department for any worsening symptoms whatsoever.  Also, patient was given an instruction sheet regarding both Benadryl and Pepcid administration for any worsening symptoms as well.

## 2015-12-11 ENCOUNTER — Other Ambulatory Visit: Payer: Self-pay | Admitting: *Deleted

## 2015-12-11 MED ORDER — MAGIC MOUTHWASH: 5.0000 mL | Freq: Four times a day (QID) | ORAL | Status: DC | PRN

## 2015-12-11 NOTE — Telephone Encounter (Signed)
Pt stated that she is using Biotene mouthwash, which has been ineffective so far.  Dr. Benay Spice notified and order received for Magic Mouthwash to be sent in for pt.  Pt notified of new prescription and has no further questions at this time.

## 2015-12-12 ENCOUNTER — Telehealth: Payer: Self-pay | Admitting: Oncology

## 2015-12-12 ENCOUNTER — Telehealth: Payer: Self-pay | Admitting: *Deleted

## 2015-12-12 NOTE — Telephone Encounter (Signed)
Returned call to daughter and left VM that tumor has not at this time been sent for molecular testing, but can be done in the future if needed. Patient needs to give consent and be aware of cost of testing prior to it being sent. Can discuss with her at her next office visit on 12/18/15.

## 2015-12-12 NOTE — Telephone Encounter (Signed)
Incoming call from daughter, Esmond Camper: Asking if patient's tumor has had any molecular genetic testing done ? Asking about having this done (she knows a lab that does this). Informed her we are aware of this type of testing and use [facility] lab which also provides potential clinical trials available along side the results.  She reports that patient has been approved for the [id number] drug. They will be going to [facility] to meet with the physician who can administer the drug--are not going to act on anything at this time. Just want options open for when/if she starts to progress on current treatment. Will see Dr. Gillie Manners. Notified clinical trial nurse of conversation as well as Dr. Benay Spice. Next appointment is 12/18/15.

## 2015-12-12 NOTE — Telephone Encounter (Signed)
Faxed records to [facility] (Dr. Rushie Nyhan) [phone] release id)

## 2015-12-12 NOTE — Telephone Encounter (Signed)
Opened in error

## 2015-12-15 ENCOUNTER — Other Ambulatory Visit: Payer: Self-pay | Admitting: Oncology

## 2015-12-18 ENCOUNTER — Telehealth: Payer: Self-pay | Admitting: Oncology

## 2015-12-18 ENCOUNTER — Other Ambulatory Visit: Payer: Self-pay | Admitting: Nurse Practitioner

## 2015-12-18 ENCOUNTER — Other Ambulatory Visit (HOSPITAL_BASED_OUTPATIENT_CLINIC_OR_DEPARTMENT_OTHER): Payer: Medicare Other

## 2015-12-18 ENCOUNTER — Ambulatory Visit: Payer: Medicare Other

## 2015-12-18 ENCOUNTER — Ambulatory Visit (HOSPITAL_BASED_OUTPATIENT_CLINIC_OR_DEPARTMENT_OTHER): Payer: Medicare Other | Admitting: Oncology

## 2015-12-18 ENCOUNTER — Encounter: Payer: Self-pay | Admitting: *Deleted

## 2015-12-18 ENCOUNTER — Ambulatory Visit (HOSPITAL_BASED_OUTPATIENT_CLINIC_OR_DEPARTMENT_OTHER): Payer: Medicare Other

## 2015-12-18 ENCOUNTER — Ambulatory Visit: Payer: Medicare Other | Admitting: Nutrition

## 2015-12-18 VITALS — BP 119/56 | HR 108 | Temp 98.2°F | Resp 18 | Ht 62.0 in | Wt 117.7 lb

## 2015-12-18 DIAGNOSIS — Z5111 Encounter for antineoplastic chemotherapy: Secondary | ICD-10-CM | POA: Diagnosis present

## 2015-12-18 DIAGNOSIS — G620 Drug-induced polyneuropathy: Secondary | ICD-10-CM | POA: Diagnosis not present

## 2015-12-18 DIAGNOSIS — C252 Malignant neoplasm of tail of pancreas: Secondary | ICD-10-CM

## 2015-12-18 DIAGNOSIS — C787 Secondary malignant neoplasm of liver and intrahepatic bile duct: Secondary | ICD-10-CM | POA: Diagnosis not present

## 2015-12-18 DIAGNOSIS — Z006 Encounter for examination for normal comparison and control in clinical research program: Secondary | ICD-10-CM

## 2015-12-18 DIAGNOSIS — Z95828 Presence of other vascular implants and grafts: Secondary | ICD-10-CM

## 2015-12-18 LAB — CBC WITH DIFFERENTIAL/PLATELET
BASO%: 0.2 % (ref 0.0–2.0)
Basophils Absolute: 0 10*3/uL (ref 0.0–0.1)
EOS%: 3.4 % (ref 0.0–7.0)
Eosinophils Absolute: 0.4 10*3/uL (ref 0.0–0.5)
HCT: 31.1 % — ABNORMAL LOW (ref 34.8–46.6)
HGB: 9.6 g/dL — ABNORMAL LOW (ref 11.6–15.9)
LYMPH%: 16.9 % (ref 14.0–49.7)
MCH: 28.7 pg (ref 25.1–34.0)
MCHC: 30.9 g/dL — AB (ref 31.5–36.0)
MCV: 92.8 fL (ref 79.5–101.0)
MONO#: 1.6 10*3/uL — ABNORMAL HIGH (ref 0.1–0.9)
MONO%: 14.1 % — ABNORMAL HIGH (ref 0.0–14.0)
NEUT#: 7.2 10*3/uL — ABNORMAL HIGH (ref 1.5–6.5)
NEUT%: 65.4 % (ref 38.4–76.8)
Platelets: 114 10*3/uL — ABNORMAL LOW (ref 145–400)
RBC: 3.35 10*6/uL — AB (ref 3.70–5.45)
RDW: 23.2 % — ABNORMAL HIGH (ref 11.2–14.5)
WBC: 11 10*3/uL — ABNORMAL HIGH (ref 3.9–10.3)
lymph#: 1.9 10*3/uL (ref 0.9–3.3)

## 2015-12-18 LAB — COMPREHENSIVE METABOLIC PANEL
ALT: 24 U/L (ref 0–55)
ANION GAP: 9 meq/L (ref 3–11)
AST: 27 U/L (ref 5–34)
Albumin: 2.9 g/dL — ABNORMAL LOW (ref 3.5–5.0)
Alkaline Phosphatase: 217 U/L — ABNORMAL HIGH (ref 40–150)
BUN: 12.5 mg/dL (ref 7.0–26.0)
CHLORIDE: 103 meq/L (ref 98–109)
CO2: 25 meq/L (ref 22–29)
CREATININE: 0.7 mg/dL (ref 0.6–1.1)
Calcium: 9.2 mg/dL (ref 8.4–10.4)
EGFR: 85 mL/min/{1.73_m2} — ABNORMAL LOW (ref >90)
Glucose: 116 mg/dl (ref 70–140)
POTASSIUM: 4.2 meq/L (ref 3.5–5.1)
Sodium: 137 mEq/L (ref 136–145)
Total Bilirubin: <0.3 mg/dL (ref 0.20–1.20)
Total Protein: 5.8 g/dL — ABNORMAL LOW (ref 6.4–8.3)

## 2015-12-18 MED ORDER — SODIUM CHLORIDE 0.9% FLUSH
10.0000 mL | INTRAVENOUS | Status: DC | PRN
  Administered 2015-12-18: 10 mL via INTRAVENOUS
  Filled 2015-12-18: qty 10

## 2015-12-18 MED ORDER — IRINOTECAN HCL CHEMO INJECTION 100 MG/5ML
180.0000 mg/m2 | Freq: Once | INTRAVENOUS | Status: AC
  Administered 2015-12-18: 276 mg via INTRAVENOUS
  Filled 2015-12-18: qty 10

## 2015-12-18 MED ORDER — IRINOTECAN HCL CHEMO INJECTION 100 MG/5ML: 180.0000 mg/m2 | Freq: Once | INTRAVENOUS | Status: DC

## 2015-12-18 MED ORDER — PALONOSETRON HCL INJECTION 0.25 MG/5ML
0.2500 mg | Freq: Once | INTRAVENOUS | Status: AC
  Administered 2015-12-18: 0.25 mg via INTRAVENOUS

## 2015-12-18 MED ORDER — FLUOROURACIL CHEMO INJECTION 5 GM/100ML: 2400.0000 mg/m2 | INTRAVENOUS | Status: DC

## 2015-12-18 MED ORDER — PALONOSETRON HCL INJECTION 0.25 MG/5ML
INTRAVENOUS | Status: AC
  Filled 2015-12-18: qty 5

## 2015-12-18 MED ORDER — SODIUM CHLORIDE 0.9 % IJ SOLN
10.0000 mL | INTRAMUSCULAR | Status: DC | PRN
  Filled 2015-12-18: qty 10

## 2015-12-18 MED ORDER — SODIUM CHLORIDE 0.9 % IV SOLN
Freq: Once | INTRAVENOUS | Status: AC
  Administered 2015-12-18: 12:00:00 via INTRAVENOUS
  Filled 2015-12-18: qty 5

## 2015-12-18 MED ORDER — LEUCOVORIN CALCIUM INJECTION 350 MG: 400.0000 mg/m2 | Freq: Once | INTRAVENOUS | Status: DC

## 2015-12-18 MED ORDER — HEPARIN SOD (PORK) LOCK FLUSH 100 UNIT/ML IV SOLN
500.0000 [IU] | Freq: Once | INTRAVENOUS | Status: DC | PRN
  Filled 2015-12-18: qty 5

## 2015-12-18 MED ORDER — OXALIPLATIN CHEMO INJECTION 100 MG/20ML
85.0000 mg/m2 | Freq: Once | INTRAVENOUS | Status: AC
  Administered 2015-12-18: 130 mg via INTRAVENOUS
  Filled 2015-12-18: qty 6

## 2015-12-18 MED ORDER — OXALIPLATIN CHEMO INJECTION 100 MG/20ML: 85.0000 mg/m2 | Freq: Once | INTRAVENOUS | Status: DC

## 2015-12-18 MED ORDER — SODIUM CHLORIDE 0.9 % IV SOLN
2400.0000 mg/m2 | INTRAVENOUS | Status: DC
  Administered 2015-12-18: 3650 mg via INTRAVENOUS
  Filled 2015-12-18: qty 73

## 2015-12-18 MED ORDER — ATROPINE SULFATE 1 MG/ML IJ SOLN: 0.5000 mg | Freq: Once | INTRAMUSCULAR | Status: DC | PRN

## 2015-12-18 MED ORDER — DEXTROSE 5 % IV SOLN
Freq: Once | INTRAVENOUS | Status: AC
  Administered 2015-12-18: 12:00:00 via INTRAVENOUS

## 2015-12-18 MED ORDER — LEUCOVORIN CALCIUM INJECTION 350 MG
400.0000 mg/m2 | Freq: Once | INTRAVENOUS | Status: AC
  Administered 2015-12-18: 612 mg via INTRAVENOUS
  Filled 2015-12-18: qty 30.6

## 2015-12-18 NOTE — Telephone Encounter (Signed)
per of to sch pt appt-sent MW email tosch trmt-pt to get updated copy b4 leaving trmt °

## 2015-12-18 NOTE — Progress Notes (Signed)
  12/18/2015 1135  SWOG [id number] cycle 7, day 1 The patient returns to clinic today accompanied by her daughter, Esmond Camper.  She had resting VS and labs drawn.  She states she is feeling well today.  She continues with intermittent fatigue and it took longer for her energy level to return this time.  She reports still getting out, walking, and taking day trips out of town.  She said she has had periods of "loose stools", up to 3- 4 per day from time to time.  She denies watery stools or constipation.  She has mild mouth soreness and a healing mouth sore. She has nausea-intermittent.  She said it does not interfere with eating. She reports her pain is better, only occurs at night.  She denies numbness or tingling.   Dr. Benay Spice was in to see and examine patient.  Dr. Benay Spice was reminded the patient had a grade 1 infusion "reaction" last visit, on 12/04/15, which resolved while patient was in infusion room.  Institutional policy was followed and patient received Pepcid and Benadryl for it.  Per MD, nothing to change today, no new medications to give, continue treatment as planned and follow institutional protocol if reaction occurs.  This was confirmed previously, for protocol reasons, with second RN, Primary school teacher.     MD  performed a PE and reviewed the patient's labs from today. Based on labs from today and physical exam, MD cleared the patient to continue treatment.  The patient also has a grade 2 Hgb today.  Per MD, continue to treat, nothing to change. Kenton Kingfisher, Geophysicist/field seismologist confirmed with study chair, Dr. Kathaleen Bury on 12/04/15, that the patient can continue treatment with Hgb gr 2  as the study really does not specify in regards to Hgb.  The patient has a mild rash on her chest, but cannot relate it to anything. Patient's weight has decreased >10% from dosing weight of 60.056 (taken 09/24/15).  Her new dosing weight will be based on today's weight of 53.4 kg, doses will be re-calculated as a result.   Dr. Benay Spice agreed and this was confirmed with Henreitta Leber, Pharmacist. Infusion sign with instructions and report on above given to Hinda Lenis, RN.  She had no treatment questions. The patient and her daughter understand to call for any problems.  This RN thanked the patient for her time and commitment to the study. Marcellus Scott, RN, BSN, MHA, OCN

## 2015-12-18 NOTE — Progress Notes (Signed)
Nutrition follow-up completed with patient during infusion for pancreas cancer. Patient states she feels well. Weight continues to decrease and was documented as 117.7 pounds April 19 decreased from 121.4 pounds March 22. Patient is consuming two oral nutrition supplements daily. Is trying to eat at mealtimes; eats very small amounts. Patient's daughter has encouraged her to consume 3 oral nutrition supplements.  The patient has been hesitant  Nutrition diagnosis: Food and nutrition related knowledge deficit improved.  Intervention: Recommended patient consume Ensure Plus 3 times a day between meals. Encouraged patient to focus on high-calorie, high-protein foods at mealtimes. Recommended patient drink Ensure Plus at meals if she is not eating enough food Teach back method used.  Monitoring, evaluation, goals: Patient will work to increase calories and protein to minimize further weight loss.  Next visit: No follow-up has been scheduled.  Patient will contact me by phone for questions or concerns.  **Disclaimer: This note was dictated with voice recognition software. Similar sounding words can inadvertently be transcribed and this note may contain transcription errors which may not have been corrected upon publication of note.**

## 2015-12-18 NOTE — Progress Notes (Signed)
  [facility] OFFICE PROGRESS NOTE   Diagnosis: Pancreas cancer  INTERVAL HISTORY:   Wendy Palmer completed another cycle of FOLFIRINOX on 12/04/2015.  She had tongue numbness and dysarthria toward end of infusion 4/5.  No neuropathy symptoms at present.  She developed an ulcer on rt. Side of tongue. Her pain has improved.  Objective:  Vital signs in last 24 hours:  Blood pressure 119/56, pulse 108, temperature 98.2 F (36.8 C), temperature source Oral, resp. rate 18, height 5\' 2"  (1.575 m), weight 117 lb 11.2 oz (53.388 kg), SpO2 97 %.    HEENT: no thrush or ulcers Resp: lungs clear Cardio: rrr GI: no mass , nontender Vascular: no  Leg edema Neuro:mild decrease in vibratory sense at the fingers bilaterally    Portacath/PICC-without erythema  Lab Results:  Lab Results  Component Value Date   WBC 11.0* 12/18/2015   HGB 9.6* 12/18/2015   HCT 31.1* 12/18/2015   MCV 92.8 12/18/2015   PLT 114* 12/18/2015   NEUTROABS 7.2* 12/18/2015      Medications: I have reviewed the patient's current medications.  Assessment/Plan: 1. Pancreas cancer  MRI abdomen 08/27/2015 revealed a pancreas mass, adenopathy, and liver metastases  CTs 09/10/2015 with pancreas body/tail mass, liver metastases, lymphadenopathy, innumerable tiny pulmonary nodules  Ultrasound-guided biopsy of a right liver lesion 09/10/2015 confirmed adenocarcinoma  Enrollment on the SWOG S1313 study, randomized to the standard FOLFIRINOX arm  Cycle 1 FOLFIRINOX 09/25/2015  Cycle 2 FOLFIRINOX 10/09/2015  Cycle 3 FOLFIRINOX 10/23/2015  Cycle 4 FOLFIRINOX 11/05/2015  CT 11/19/2015- decrease in the size of liver lesions and the pancreas mass, ? Increased upper abdominal/retroperitoneal adenopath  Cycle 5 FOLFIRINOX 11/20/2015  Cycle 6 FOLFIRINOX 12/04/2015  Cycle 7 FOLFORINOX 12/18/2015  2. Pain secondary to #1, improved  3. History of constipation-likely secondary to narcotics  4.  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  Plan to proceed with cycle 7 chemotherapy today.  She will return for an office visit and chemotherapy in 2 weeks.  The symptoms she experienced toward the end of chemotherapy on 4/5 were likely related to acute oxaliplatin neuropathy.  Her ECOG performance status is 1 today.  Betsy Coder, MD  12/18/2015  3:52 PM

## 2015-12-19 ENCOUNTER — Other Ambulatory Visit: Payer: Self-pay

## 2015-12-19 DIAGNOSIS — Z95828 Presence of other vascular implants and grafts: Secondary | ICD-10-CM | POA: Insufficient documentation

## 2015-12-19 LAB — CANCER ANTIGEN 19-9: CAN 19-9: 1 U/mL (ref 0–35)

## 2015-12-20 ENCOUNTER — Ambulatory Visit (HOSPITAL_BASED_OUTPATIENT_CLINIC_OR_DEPARTMENT_OTHER): Payer: Medicare Other

## 2015-12-20 VITALS — BP 113/45 | HR 98 | Temp 98.5°F

## 2015-12-20 DIAGNOSIS — Z006 Encounter for examination for normal comparison and control in clinical research program: Secondary | ICD-10-CM

## 2015-12-20 DIAGNOSIS — C252 Malignant neoplasm of tail of pancreas: Secondary | ICD-10-CM

## 2015-12-20 MED ORDER — PEGFILGRASTIM INJECTION 6 MG/0.6ML ~~LOC~~
6.0000 mg | PREFILLED_SYRINGE | Freq: Once | SUBCUTANEOUS | Status: AC
  Administered 2015-12-20: 6 mg via SUBCUTANEOUS
  Filled 2015-12-20: qty 0.6

## 2015-12-20 MED ORDER — HEPARIN SOD (PORK) LOCK FLUSH 100 UNIT/ML IV SOLN
500.0000 [IU] | Freq: Once | INTRAVENOUS | Status: AC | PRN
  Administered 2015-12-20: 500 [IU]
  Filled 2015-12-20: qty 5

## 2015-12-20 MED ORDER — SODIUM CHLORIDE 0.9 % IJ SOLN
10.0000 mL | INTRAMUSCULAR | Status: DC | PRN
  Administered 2015-12-20: 10 mL
  Filled 2015-12-20: qty 10

## 2015-12-23 ENCOUNTER — Telehealth: Payer: Self-pay | Admitting: *Deleted

## 2015-12-23 NOTE — Telephone Encounter (Signed)
12/23/2015 1515 Patient left voice message asking if she can get her teeth cleaned tomorrow.  This RN asked Dr. Benay Spice and he said it was okay for gentle teeth cleaning with no invasive procedures. The patient's call was returned and above information was given to patient.  This RN reminded patient to inform her dentist that she was currently on chemotherapy.  The patient denied bleeding, bruising, fever, or other complaints and verbalized comprehension of instructions.

## 2015-12-24 ENCOUNTER — Encounter: Payer: Self-pay | Admitting: Oncology

## 2015-12-25 NOTE — Progress Notes (Signed)
Call received at 2:10 pm today from patient stating "someone called about billing".  This message has not been read by patient at this time.  Shared note.  Reports to "[place] [facility] Test for now.  There is a possibility she will ask for this later."  Message left for collaborative.

## 2015-12-26 ENCOUNTER — Encounter: Payer: Self-pay | Admitting: Oncology

## 2015-12-27 ENCOUNTER — Telehealth: Payer: Self-pay | Admitting: *Deleted

## 2015-12-27 NOTE — Telephone Encounter (Signed)
VM from patient asking when her next CT scan will be scheduled? Wants to proceed with getting it scheduled, even if it will be done after the chemo on 01/01/16. Sent message to MD w/her request. Also notified research nurse, Barnett Applebaum of request via VM.

## 2015-12-29 ENCOUNTER — Other Ambulatory Visit: Payer: Self-pay | Admitting: Oncology

## 2015-12-31 ENCOUNTER — Telehealth: Payer: Self-pay | Admitting: Oncology

## 2015-12-31 NOTE — Telephone Encounter (Signed)
Faxed pt medical records to Dr. Lanora Manis  Ordered 2017 scans on cd to be fedex'ed to Dr. Norberta Keens

## 2016-01-01 ENCOUNTER — Ambulatory Visit (HOSPITAL_BASED_OUTPATIENT_CLINIC_OR_DEPARTMENT_OTHER): Payer: Medicare Other

## 2016-01-01 ENCOUNTER — Encounter: Payer: Self-pay | Admitting: *Deleted

## 2016-01-01 ENCOUNTER — Telehealth: Payer: Self-pay | Admitting: Oncology

## 2016-01-01 ENCOUNTER — Telehealth: Payer: Self-pay | Admitting: *Deleted

## 2016-01-01 ENCOUNTER — Other Ambulatory Visit (HOSPITAL_BASED_OUTPATIENT_CLINIC_OR_DEPARTMENT_OTHER): Payer: Medicare Other

## 2016-01-01 ENCOUNTER — Ambulatory Visit: Payer: Medicare Other

## 2016-01-01 ENCOUNTER — Ambulatory Visit (HOSPITAL_BASED_OUTPATIENT_CLINIC_OR_DEPARTMENT_OTHER): Payer: Medicare Other | Admitting: Nurse Practitioner

## 2016-01-01 VITALS — BP 107/52 | HR 92 | Temp 98.2°F | Resp 17 | Ht 62.0 in | Wt 117.7 lb

## 2016-01-01 DIAGNOSIS — C787 Secondary malignant neoplasm of liver and intrahepatic bile duct: Secondary | ICD-10-CM

## 2016-01-01 DIAGNOSIS — Z5111 Encounter for antineoplastic chemotherapy: Secondary | ICD-10-CM | POA: Diagnosis present

## 2016-01-01 DIAGNOSIS — C252 Malignant neoplasm of tail of pancreas: Secondary | ICD-10-CM

## 2016-01-01 DIAGNOSIS — Z006 Encounter for examination for normal comparison and control in clinical research program: Secondary | ICD-10-CM

## 2016-01-01 DIAGNOSIS — Z95828 Presence of other vascular implants and grafts: Secondary | ICD-10-CM

## 2016-01-01 DIAGNOSIS — D696 Thrombocytopenia, unspecified: Secondary | ICD-10-CM

## 2016-01-01 LAB — CBC WITH DIFFERENTIAL/PLATELET
BASO%: 0.5 % (ref 0.0–2.0)
BASOS ABS: 0 10*3/uL (ref 0.0–0.1)
EOS ABS: 0.2 10*3/uL (ref 0.0–0.5)
EOS%: 2.3 % (ref 0.0–7.0)
HCT: 29.5 % — ABNORMAL LOW (ref 34.8–46.6)
HEMOGLOBIN: 9.1 g/dL — AB (ref 11.6–15.9)
LYMPH#: 1.7 10*3/uL (ref 0.9–3.3)
LYMPH%: 26 % (ref 14.0–49.7)
MCH: 29 pg (ref 25.1–34.0)
MCHC: 30.8 g/dL — ABNORMAL LOW (ref 31.5–36.0)
MCV: 93.9 fL (ref 79.5–101.0)
MONO#: 1 10*3/uL — ABNORMAL HIGH (ref 0.1–0.9)
MONO%: 15.7 % — ABNORMAL HIGH (ref 0.0–14.0)
NEUT%: 55.5 % (ref 38.4–76.8)
NEUTROS ABS: 3.7 10*3/uL (ref 1.5–6.5)
NRBC: 0 % (ref 0–0)
Platelets: 100 10*3/uL — ABNORMAL LOW (ref 145–400)
RBC: 3.14 10*6/uL — ABNORMAL LOW (ref 3.70–5.45)
RDW: 22.8 % — AB (ref 11.2–14.5)
WBC: 6.6 10*3/uL (ref 3.9–10.3)

## 2016-01-01 LAB — COMPREHENSIVE METABOLIC PANEL
ALBUMIN: 2.9 g/dL — AB (ref 3.5–5.0)
ALK PHOS: 190 U/L — AB (ref 40–150)
ALT: 25 U/L (ref 0–55)
AST: 29 U/L (ref 5–34)
Anion Gap: 8 mEq/L (ref 3–11)
BUN: 7.1 mg/dL (ref 7.0–26.0)
CO2: 28 mEq/L (ref 22–29)
Calcium: 8.7 mg/dL (ref 8.4–10.4)
Chloride: 104 mEq/L (ref 98–109)
Creatinine: 0.7 mg/dL (ref 0.6–1.1)
EGFR: >90 mL/min/{1.73_m2} (ref >90)
GLUCOSE: 110 mg/dL (ref 70–140)
POTASSIUM: 3.6 meq/L (ref 3.5–5.1)
SODIUM: 140 meq/L (ref 136–145)
TOTAL PROTEIN: 5.4 g/dL — AB (ref 6.4–8.3)
Total Bilirubin: <0.3 mg/dL (ref 0.20–1.20)

## 2016-01-01 MED ORDER — DEXTROSE 5 % IV SOLN
180.0000 mg/m2 | Freq: Once | INTRAVENOUS | Status: AC
  Administered 2016-01-01: 276 mg via INTRAVENOUS
  Filled 2016-01-01: qty 9.8

## 2016-01-01 MED ORDER — OXALIPLATIN CHEMO INJECTION 100 MG/20ML
85.0000 mg/m2 | Freq: Once | INTRAVENOUS | Status: AC
  Administered 2016-01-01: 130 mg via INTRAVENOUS
  Filled 2016-01-01: qty 26

## 2016-01-01 MED ORDER — SODIUM CHLORIDE 0.9 % IV SOLN
2400.0000 mg/m2 | INTRAVENOUS | Status: DC
  Administered 2016-01-01: 3650 mg via INTRAVENOUS
  Filled 2016-01-01: qty 73

## 2016-01-01 MED ORDER — SODIUM CHLORIDE 0.9 % IV SOLN
Freq: Once | INTRAVENOUS | Status: AC
  Administered 2016-01-01: 11:00:00 via INTRAVENOUS
  Filled 2016-01-01: qty 5

## 2016-01-01 MED ORDER — ATROPINE SULFATE 1 MG/ML IJ SOLN: 0.5000 mg | Freq: Once | INTRAMUSCULAR | Status: DC | PRN

## 2016-01-01 MED ORDER — PALONOSETRON HCL INJECTION 0.25 MG/5ML
INTRAVENOUS | Status: AC
  Filled 2016-01-01: qty 5

## 2016-01-01 MED ORDER — SODIUM CHLORIDE 0.9 % IJ SOLN
10.0000 mL | INTRAMUSCULAR | Status: DC | PRN
  Administered 2016-01-01: 10 mL via INTRAVENOUS
  Filled 2016-01-01: qty 10

## 2016-01-01 MED ORDER — LEUCOVORIN CALCIUM INJECTION 350 MG
400.0000 mg/m2 | Freq: Once | INTRAVENOUS | Status: AC
  Administered 2016-01-01: 612 mg via INTRAVENOUS
  Filled 2016-01-01: qty 30.6

## 2016-01-01 MED ORDER — DEXTROSE 5 % IV SOLN
Freq: Once | INTRAVENOUS | Status: AC
  Administered 2016-01-01: 11:00:00 via INTRAVENOUS

## 2016-01-01 MED ORDER — PALONOSETRON HCL INJECTION 0.25 MG/5ML
0.2500 mg | Freq: Once | INTRAVENOUS | Status: AC
  Administered 2016-01-01: 0.25 mg via INTRAVENOUS

## 2016-01-01 NOTE — Telephone Encounter (Signed)
per pof to sch pt appt-sent MW meail to[facility] trmt per pof-pt to get updatedec opy of avs b4 leaving

## 2016-01-01 NOTE — Telephone Encounter (Signed)
Per staff message and POF I have scheduled appts. Advised scheduler of appts. JMW  

## 2016-01-01 NOTE — Progress Notes (Addendum)
  [place] OFFICE PROGRESS NOTE   Diagnosis:  Pancreas cancer  INTERVAL HISTORY:   Wendy Palmer returns as scheduled. She completed cycle 7 FOLFIRINOX 12/18/2015. She denies nausea/vomiting. She developed a single sore on her tongue.  No diarrhea. Cold sensitivity lasted 3 days. No persistent neuropathy symptoms. She continues to have "a little" low back pain at nighttime. She takes Dilaudid in the evenings.  Objective:  Vital signs in last 24 hours:  Blood pressure 107/52, pulse 92, temperature 98.2 F (36.8 C), temperature source Oral, resp. rate 17, height 5\' 2"  (1.575 m), weight 117 lb 11.2 oz (53.388 kg), SpO2 98 %.    HEENT: no thrush or ulcers. Resp: lungs clear bilaterally. Cardio:  Regular rate and rhythm. GI: abdomen soft and nontender. No hepatomegaly. Vascular: no leg edema. Calves soft and nontender. Neuro: vibratory sense intact to mildly decreased over the fingertips per tuning fork exam.  Skin: no rash. Port-A-Cath without erythema.    Lab Results:  Lab Results  Component Value Date   WBC 6.6 01/01/2016   HGB 9.1* 01/01/2016   HCT 29.5* 01/01/2016   MCV 93.9 01/01/2016   PLT 100* 01/01/2016   NEUTROABS 3.7 01/01/2016    Imaging:  No results found.  Medications: I have reviewed the patient's current medications.  Assessment/Plan: 1. Pancreas cancer  MRI abdomen 08/27/2015 revealed a pancreas mass, adenopathy, and liver metastases  CTs 09/10/2015 with pancreas body/tail mass, liver metastases, lymphadenopathy, innumerable tiny pulmonary nodules  Ultrasound-guided biopsy of a right liver lesion 09/10/2015 confirmed adenocarcinoma  Enrollment on the SWOG S1313 study, randomized to the standard FOLFIRINOX arm  Cycle 1 FOLFIRINOX 09/25/2015  Cycle 2 FOLFIRINOX 10/09/2015  Cycle 3 FOLFIRINOX 10/23/2015  Cycle 4 FOLFIRINOX 11/05/2015  CT 11/19/2015- decrease in the size of liver lesions and the pancreas mass, ? Increased upper  abdominal/retroperitoneal adenopath  Cycle 5 FOLFIRINOX 11/20/2015  Cycle 6 FOLFIRINOX 12/04/2015  Cycle 7 FOLFIRINOX 12/18/2015  Cycle 8 FOLFIRINOX 01/01/2016  2. Pain secondary to #1, improved  3. History of constipation-likely secondary to narcotics  4. 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 She has completed 7 cycles of FOLFIRINOX. Plan to proceed with cycle 8 today as scheduled. The plan is for a restaging CT evaluation after this cycle.  She has mild thrombocytopenia. She understands to contact the office with any bleeding.  She will return for a follow-up visit in 2 weeks. She will contact the office in the interim as outlined above or with any other problems.  ECOG performance status measured at 1.  Plan reviewed with Dr. Benay Spice.    Ned Card ANP/GNP-BC   01/01/2016  10:20 AM

## 2016-01-01 NOTE — Patient Instructions (Signed)
[facility] Discharge Instructions for Patients Receiving Chemotherapy  Today you received the following chemotherapy agents: Oxaliplatin, Luecovorin, Irinotecan and Adrucil   To help prevent nausea and vomiting after your treatment, we encourage you to take your nausea medication as directed.    If you develop nausea and vomiting that is not controlled by your nausea medication, call the clinic.   BELOW ARE SYMPTOMS THAT SHOULD BE REPORTED IMMEDIATELY:  *FEVER GREATER THAN 100.5 F  *CHILLS WITH OR WITHOUT FEVER  NAUSEA AND VOMITING THAT IS NOT CONTROLLED WITH YOUR NAUSEA MEDICATION  *UNUSUAL SHORTNESS OF BREATH  *UNUSUAL BRUISING OR BLEEDING  TENDERNESS IN MOUTH AND THROAT WITH OR WITHOUT PRESENCE OF ULCERS  *URINARY PROBLEMS  *BOWEL PROBLEMS  UNUSUAL RASH Items with * indicate a potential emergency and should be followed up as soon as possible.  Feel free to call the clinic you have any questions or concerns. The clinic phone number is [phone].  Please show the [place] at check-in to the Emergency Department and triage nurse.

## 2016-01-01 NOTE — Progress Notes (Signed)
Oncology Nurse Navigator Documentation  Oncology Nurse Navigator Flowsheets 01/01/2016  Navigator Location [facility]  Navigator Encounter Type Treatment  Telephone -  Abnormal Finding Date -  Confirmed Diagnosis Date -  Patient Visit Type MedOnc  Treatment Phase Active Tx--FOLFIRINOX # 7  Barriers/Navigation Needs Coordination of Care  Education -  Interventions Coordination of Care--[facility] Testing  Coordination of Care Other  Education Method Verbal;Written--Billing process for [facility]  Support Groups/Services -Provided info regarding Look Good Feel Better--informed nurse that her hair was starting to thin now on top.  Acuity -  Time Spent with Patient 30  Discussed the [facility] Testing with patient and her daughter and explained the billing process and patient assistance company provides. Gave her handout to get on the website to view the billing procedure. She is completing the Patient Assistance Form in office today for navigator to fax to [facility]. Emailed pathology to send the case out:  Patient: Wendy Palmer, Wendy Palmer Collected: 09/09/2015 Client: [facility] Accession: [id number] Received: 09/09/2015 Art Hoss DOB: 23-Apr-1951 Age: 65 Gender: F Reported: 09/11/2015 [address] Patient Ph: [phone] MRN #: [id number] [place], [place] [address] Visit #: [id number] Chart #: Phone: [phone] Fax: CC: Redge Gainer, MD REPORT

## 2016-01-01 NOTE — Progress Notes (Signed)
01/01/2016 1025 Cycle 8, day 1 The patient comes to clinic accompanied by her daughter, Esmond Camper.  She has had a good last couple of weeks.  She reports feeling well the last several days and has been getting out and shopping, visiting friends.  She reports fatigue.  She denies dizziness.  She has intermittent mild nausea at times, no emesis.  Her mouth is slightly sore, no sores.  Her weight is stable.  She denies constipation or diarrhea.  She denies numbness or tingling and said she had cold sensitivity lasting 3 days after her last treatment..  Her pain is better but she still takes pain medication in the evenings.  She reports the rash on her chest resolved by 12/20/15.  No new rash to report.  She denies bleeding or bruising. She was seen and examined by L. Marcello Moores, NP.  Based on lab results review by NP and PE, she was cleared for treatment. Drug dosages were checked with Romualdo Bolk, Pharm. D.  Treatment sign with infusion instructions were given to [facility].  She was without questions. The patient was reminded to use sunscreen cream in case of solar exposure.  She was reminded not to take any new medications, herbs, or vitamins without checking with MD first.  She verbalized understanding and had no further questions.

## 2016-01-01 NOTE — Progress Notes (Signed)
Faxed Foundation Access: Patient Patent examiner to [id number] after confirming with company it is OK to receive this prior to tissue sample arrival. Upon receipt they will assign her an account number.

## 2016-01-03 ENCOUNTER — Ambulatory Visit (HOSPITAL_BASED_OUTPATIENT_CLINIC_OR_DEPARTMENT_OTHER): Payer: Medicare Other

## 2016-01-03 ENCOUNTER — Ambulatory Visit: Payer: Medicare Other

## 2016-01-03 DIAGNOSIS — C252 Malignant neoplasm of tail of pancreas: Secondary | ICD-10-CM

## 2016-01-03 DIAGNOSIS — Z006 Encounter for examination for normal comparison and control in clinical research program: Secondary | ICD-10-CM | POA: Diagnosis not present

## 2016-01-03 MED ORDER — SODIUM CHLORIDE 0.9 % IJ SOLN
10.0000 mL | INTRAMUSCULAR | Status: DC | PRN
  Administered 2016-01-03: 10 mL
  Filled 2016-01-03: qty 10

## 2016-01-03 MED ORDER — PEGFILGRASTIM INJECTION 6 MG/0.6ML ~~LOC~~
6.0000 mg | PREFILLED_SYRINGE | Freq: Once | SUBCUTANEOUS | Status: AC
  Administered 2016-01-03: 6 mg via SUBCUTANEOUS
  Filled 2016-01-03: qty 0.6

## 2016-01-03 MED ORDER — HEPARIN SOD (PORK) LOCK FLUSH 100 UNIT/ML IV SOLN
500.0000 [IU] | Freq: Once | INTRAVENOUS | Status: AC | PRN
  Administered 2016-01-03: 500 [IU]
  Filled 2016-01-03: qty 5

## 2016-01-09 ENCOUNTER — Encounter: Payer: Self-pay | Admitting: *Deleted

## 2016-01-09 NOTE — Progress Notes (Signed)
01/09/2016 1545 On 01/01/16 Dr. Benay Spice ordered [facility] Testing to be performed on tissue.  On 01/08/16 late afternoon the [facility] pathology department notified Research Assistant, Remer Macho,  that the research department would need to retrieve the tissue block, Case ID:  [id number], back from [facility] because that was the only tissue block for this case. The [employer] biorepository was contacted by Rosalyn Charters at [facility] on 01/09/16.  Permission was granted by Ardelle Lesches, Ph.D. (the [facility] who oversees the bank).  Per Rosalyn Charters at [facility] [phone], ext [phone]  [email or web address]) ,"Dr. Dwyane Luo has approved return of the specimen.  The [employer] bank will contact you if needed".   [employer] Bank contact for Biospecimens is Levin Bacon at the [email or web address].   The patient was contacted and informed of above. The patient was asked if a letter could be emailed or faxed to her so she could sign giving permission for release of the block from [employer], although not required by [employer].  The patient requested that she sign the letter next week when she comes to MD visit on 01/15/16.  She stated she approved and did not want to have to deal with emails or faxes.  Research manager, Primary school teacher informed.  Merceda Elks, GI Navigator and Dr. Benay Spice notified of above.  01/10/2016 0915 Specimen return form completed, sent, and received by [employer], Levin Bacon.  Arrangements made for the tissue block to be shipped out from [facility] to [facility] on 01/13/16.  01/14/16 1500 Tissue block, Case ID:  [id number], received back from [employer].  Edmon Crape, Research Assistant, delivered the block back to pathology, given to Signa Kell in order to proceed with [facility] One testing. Marcellus Scott, RN, BSN, MHA, OCN

## 2016-01-12 ENCOUNTER — Other Ambulatory Visit: Payer: Self-pay | Admitting: Oncology

## 2016-01-14 ENCOUNTER — Encounter (HOSPITAL_COMMUNITY): Payer: Self-pay

## 2016-01-14 ENCOUNTER — Ambulatory Visit (HOSPITAL_COMMUNITY)
Admission: RE | Admit: 2016-01-14 | Discharge: 2016-01-14 | Disposition: A | Discharge status: Home / Self Care | Payer: Medicare Other | Source: Ambulatory Visit | Attending: Nurse Practitioner | Admitting: Nurse Practitioner

## 2016-01-14 ENCOUNTER — Other Ambulatory Visit: Payer: Self-pay | Admitting: *Deleted

## 2016-01-14 DIAGNOSIS — C787 Secondary malignant neoplasm of liver and intrahepatic bile duct: Secondary | ICD-10-CM | POA: Insufficient documentation

## 2016-01-14 DIAGNOSIS — R918 Other nonspecific abnormal finding of lung field: Secondary | ICD-10-CM | POA: Insufficient documentation

## 2016-01-14 DIAGNOSIS — C252 Malignant neoplasm of tail of pancreas: Secondary | ICD-10-CM | POA: Diagnosis present

## 2016-01-14 DIAGNOSIS — R591 Generalized enlarged lymph nodes: Secondary | ICD-10-CM | POA: Insufficient documentation

## 2016-01-14 MED ORDER — IOPAMIDOL (ISOVUE-300) INJECTION 61%
100.0000 mL | Freq: Once | INTRAVENOUS | Status: AC | PRN
  Administered 2016-01-14: 100 mL via INTRAVENOUS

## 2016-01-14 MED ORDER — PROCHLORPERAZINE MALEATE 10 MG PO TABS: ORAL_TABLET | ORAL | Status: DC

## 2016-01-14 NOTE — Telephone Encounter (Signed)
Pt filled out walk-in request for Compazine refill. Rx sent electronically.

## 2016-01-15 ENCOUNTER — Other Ambulatory Visit (HOSPITAL_BASED_OUTPATIENT_CLINIC_OR_DEPARTMENT_OTHER): Payer: Medicare Other

## 2016-01-15 ENCOUNTER — Telehealth: Payer: Self-pay | Admitting: Oncology

## 2016-01-15 ENCOUNTER — Ambulatory Visit (HOSPITAL_BASED_OUTPATIENT_CLINIC_OR_DEPARTMENT_OTHER): Payer: Medicare Other | Admitting: Oncology

## 2016-01-15 ENCOUNTER — Other Ambulatory Visit: Payer: Medicare Other

## 2016-01-15 ENCOUNTER — Encounter: Payer: Self-pay | Admitting: *Deleted

## 2016-01-15 ENCOUNTER — Telehealth: Payer: Self-pay | Admitting: *Deleted

## 2016-01-15 ENCOUNTER — Ambulatory Visit: Payer: Medicare Other

## 2016-01-15 ENCOUNTER — Ambulatory Visit (HOSPITAL_BASED_OUTPATIENT_CLINIC_OR_DEPARTMENT_OTHER): Payer: Medicare Other

## 2016-01-15 ENCOUNTER — Other Ambulatory Visit (HOSPITAL_COMMUNITY)
Admission: RE | Admit: 2016-01-15 | Discharge: 2016-01-15 | Disposition: A | Discharge status: Home / Self Care | Payer: PPO | Source: Ambulatory Visit | Attending: Oncology | Admitting: Oncology

## 2016-01-15 ENCOUNTER — Ambulatory Visit: Payer: Medicare Other | Admitting: Oncology

## 2016-01-15 VITALS — BP 121/60 | HR 103 | Temp 98.1°F | Resp 16 | Ht 62.0 in | Wt 109.7 lb

## 2016-01-15 DIAGNOSIS — Z006 Encounter for examination for normal comparison and control in clinical research program: Secondary | ICD-10-CM

## 2016-01-15 DIAGNOSIS — G620 Drug-induced polyneuropathy: Secondary | ICD-10-CM

## 2016-01-15 DIAGNOSIS — Z95828 Presence of other vascular implants and grafts: Secondary | ICD-10-CM

## 2016-01-15 DIAGNOSIS — C787 Secondary malignant neoplasm of liver and intrahepatic bile duct: Secondary | ICD-10-CM | POA: Diagnosis not present

## 2016-01-15 DIAGNOSIS — Z5111 Encounter for antineoplastic chemotherapy: Secondary | ICD-10-CM

## 2016-01-15 DIAGNOSIS — R630 Anorexia: Secondary | ICD-10-CM

## 2016-01-15 DIAGNOSIS — C259 Malignant neoplasm of pancreas, unspecified: Secondary | ICD-10-CM | POA: Diagnosis present

## 2016-01-15 DIAGNOSIS — C778 Secondary and unspecified malignant neoplasm of lymph nodes of multiple regions: Secondary | ICD-10-CM | POA: Diagnosis not present

## 2016-01-15 DIAGNOSIS — C252 Malignant neoplasm of tail of pancreas: Secondary | ICD-10-CM

## 2016-01-15 LAB — CBC WITH DIFFERENTIAL/PLATELET
BASO%: 0.8 % (ref 0.0–2.0)
BASOS ABS: 0.1 10*3/uL (ref 0.0–0.1)
EOS%: 2.7 % (ref 0.0–7.0)
Eosinophils Absolute: 0.2 10*3/uL (ref 0.0–0.5)
HEMATOCRIT: 31.2 % — AB (ref 34.8–46.6)
HEMOGLOBIN: 9.7 g/dL — AB (ref 11.6–15.9)
LYMPH#: 1.4 10*3/uL (ref 0.9–3.3)
LYMPH%: 19.7 % (ref 14.0–49.7)
MCH: 29.8 pg (ref 25.1–34.0)
MCHC: 31.3 g/dL — ABNORMAL LOW (ref 31.5–36.0)
MCV: 95.2 fL (ref 79.5–101.0)
MONO#: 0.8 10*3/uL (ref 0.1–0.9)
MONO%: 10.8 % (ref 0.0–14.0)
NEUT#: 4.7 10*3/uL (ref 1.5–6.5)
NEUT%: 66 % (ref 38.4–76.8)
Platelets: 97 10*3/uL — ABNORMAL LOW (ref 145–400)
RBC: 3.28 10*6/uL — ABNORMAL LOW (ref 3.70–5.45)
RDW: 24.5 % — AB (ref 11.2–14.5)
WBC: 7.1 10*3/uL (ref 3.9–10.3)

## 2016-01-15 LAB — COMPREHENSIVE METABOLIC PANEL
ALBUMIN: 3.2 g/dL — AB (ref 3.5–5.0)
ALT: 29 U/L (ref 0–55)
AST: 34 U/L (ref 5–34)
Alkaline Phosphatase: 223 U/L — ABNORMAL HIGH (ref 40–150)
Anion Gap: 7 mEq/L (ref 3–11)
BUN: 5.6 mg/dL — AB (ref 7.0–26.0)
CALCIUM: 9.2 mg/dL (ref 8.4–10.4)
CHLORIDE: 103 meq/L (ref 98–109)
CO2: 28 mEq/L (ref 22–29)
CREATININE: 0.7 mg/dL (ref 0.6–1.1)
EGFR: 90 mL/min/{1.73_m2} — ABNORMAL LOW (ref >90)
GLUCOSE: 135 mg/dL (ref 70–140)
POTASSIUM: 3.5 meq/L (ref 3.5–5.1)
SODIUM: 138 meq/L (ref 136–145)
Total Bilirubin: <0.3 mg/dL (ref 0.20–1.20)
Total Protein: 6 g/dL — ABNORMAL LOW (ref 6.4–8.3)

## 2016-01-15 MED ORDER — SODIUM CHLORIDE 0.9 % IV SOLN
2400.0000 mg/m2 | INTRAVENOUS | Status: DC
  Administered 2016-01-15: 3650 mg via INTRAVENOUS
  Filled 2016-01-15: qty 73

## 2016-01-15 MED ORDER — DEXTROSE 5 % IV SOLN
Freq: Once | INTRAVENOUS | Status: AC
  Administered 2016-01-15: 13:00:00 via INTRAVENOUS

## 2016-01-15 MED ORDER — PALONOSETRON HCL INJECTION 0.25 MG/5ML
INTRAVENOUS | Status: AC
  Filled 2016-01-15: qty 5

## 2016-01-15 MED ORDER — OXALIPLATIN CHEMO INJECTION 100 MG/20ML
85.0000 mg/m2 | Freq: Once | INTRAVENOUS | Status: AC
  Administered 2016-01-15: 130 mg via INTRAVENOUS
  Filled 2016-01-15: qty 26

## 2016-01-15 MED ORDER — ONDANSETRON HCL 8 MG PO TABS: 8.0000 mg | ORAL_TABLET | Freq: Three times a day (TID) | ORAL | Status: DC | PRN

## 2016-01-15 MED ORDER — SODIUM CHLORIDE 0.9 % IJ SOLN
10.0000 mL | INTRAMUSCULAR | Status: DC | PRN
  Administered 2016-01-15: 10 mL via INTRAVENOUS
  Filled 2016-01-15: qty 10

## 2016-01-15 MED ORDER — SODIUM CHLORIDE 0.9 % IV SOLN
Freq: Once | INTRAVENOUS | Status: AC
  Administered 2016-01-15: 12:00:00 via INTRAVENOUS
  Filled 2016-01-15: qty 5

## 2016-01-15 MED ORDER — IRINOTECAN HCL CHEMO INJECTION 100 MG/5ML
180.0000 mg/m2 | Freq: Once | INTRAVENOUS | Status: AC
  Administered 2016-01-15: 276 mg via INTRAVENOUS
  Filled 2016-01-15: qty 13.8

## 2016-01-15 MED ORDER — DEXTROSE 5 % IV SOLN
400.0000 mg/m2 | Freq: Once | INTRAVENOUS | Status: AC
  Administered 2016-01-15: 612 mg via INTRAVENOUS
  Filled 2016-01-15: qty 30.6

## 2016-01-15 MED ORDER — PALONOSETRON HCL INJECTION 0.25 MG/5ML
0.2500 mg | Freq: Once | INTRAVENOUS | Status: AC
  Administered 2016-01-15: 0.25 mg via INTRAVENOUS

## 2016-01-15 NOTE — Telephone Encounter (Signed)
Per staff message and POF I have scheduled appts. Advised scheduler of appts. JMW  

## 2016-01-15 NOTE — Patient Instructions (Signed)
[facility] Discharge Instructions for Patients Receiving Chemotherapy  Today you received the following chemotherapy agents oxaliplatin/irinotecan/leucovorin/florouracil  To help prevent nausea and vomiting after your treatment, we encourage you to take your nausea medication as directed   If you develop nausea and vomiting that is not controlled by your nausea medication, call the clinic.   BELOW ARE SYMPTOMS THAT SHOULD BE REPORTED IMMEDIATELY:  *FEVER GREATER THAN 100.5 F  *CHILLS WITH OR WITHOUT FEVER  NAUSEA AND VOMITING THAT IS NOT CONTROLLED WITH YOUR NAUSEA MEDICATION  *UNUSUAL SHORTNESS OF BREATH  *UNUSUAL BRUISING OR BLEEDING  TENDERNESS IN MOUTH AND THROAT WITH OR WITHOUT PRESENCE OF ULCERS  *URINARY PROBLEMS  *BOWEL PROBLEMS  UNUSUAL RASH Items with * indicate a potential emergency and should be followed up as soon as possible.  Feel free to call the clinic you have any questions or concerns. The clinic phone number is [phone].

## 2016-01-15 NOTE — Progress Notes (Signed)
01/15/2016 1213 S1313 cycle 9, day 1 The patient returns to clinic with her daughter and significant other.  She reports feeling well today.  She reports mouth tenderness, no sores.  She reports her taste has changed and things don't taste the same as they used to.  She reports not having much appetite and has lost more weight since last visit.  Appetite, taste change, and mucositis were worse from 01/03/16 through 01/09/16.She said her appetite gets better the closer she gets to treatment.  She has intermittent nausea after her treatment, but denies nausea today.  She denies emesis.  She has fatigue and her energy gets better just about the time it is to come back in for treatment.  She went to a baseball game yesterday out of town.  She walks to the mailbox down a long driveway.  She reports her pain is better,taking pain meds only at night.  She has minimal numbness/tingling.  She denies bleeding, bruising, fever, or rash. Dr. Benay Spice examined patient and reviewed CT results with her.  Dr. Benay Spice reviewed labs.  Okay with Dr. Benay Spice to treat with platelet count of 97,000 and confirmed with Marlon Pel, RN okay per protocol requirements.  Dr. Benay Spice confirmed the patient's anorexia, mucositis, and taste alteration is a grade 1 today.   Based on lab results, PE, and CT scan, Dr. Benay Spice cleared patient to continue with current treatment.  The patient was in agreement.   The patient was educated to report side effects, including bleeding, bruising, diarrhea, fever, or other concerns.  She understands not to take any new medications, supplements, herbs etc. without Dr. Gearldine Shown permission.  She verbalized comprehension. Dose of drugs based on last dosing weight from 12/18/15 confirmed with Henreitta Leber, Pharm. D.  Infusion sign with instructions given to [facility] O[facility]le[facility], RN.  She was without any questions or concerns.

## 2016-01-15 NOTE — Progress Notes (Signed)
Oncology Nurse Navigator Documentation  Oncology Nurse Navigator Flowsheets 01/15/2016  Navigator Location [facility]  Navigator Encounter Type Treatment  Telephone -  Abnormal Finding Date -  Confirmed Diagnosis Date -  Patient Visit Type MedOnc  Treatment Phase Active Tx--FOLFIRINOX  Barriers/Navigation Needs Financial  Education -Provided her hardcopy of her CT report  Interventions Other--made her aware that her [facility] Patient assistance is 100% after insurance pays-gave her copy of notice.  Coordination of Care -  Education Method Verbal;Written  Support Groups/Services -  Acuity Level 1  Time Spent with Patient 30

## 2016-01-15 NOTE — Progress Notes (Signed)
OK to treat with PLT 97k, per Dr. Benay Spice. Infusion RN made aware.

## 2016-01-15 NOTE — Patient Instructions (Signed)

## 2016-01-15 NOTE — Progress Notes (Signed)
[facility] OFFICE PROGRESS NOTE   Diagnosis: Pancreas cancer  INTERVAL HISTORY:   Ms. Heiting returns as scheduled. She completed another cycle of FOLFIRINOX on 01/01/2016. No nausea/vomiting or diarrhea. She had one ulcer at the right side of the tongue that improved with Orajel. She continues to have anorexia and altered taste. The abdominal pain has improved. She takes pain medication a proximally once daily. She had cold sensitivity following chemotherapy. No other neuropathy symptoms.  Objective:  Vital signs in last 24 hours:  Blood pressure 121/60, pulse 103, temperature 98.1 F (36.7 C), temperature source Oral, resp. rate 16, height 5\' 2"  (1.575 m), weight 109 lb 11.2 oz (49.76 kg), SpO2 98 %.    HEENT: No thrush or ulcers Resp: Lungs clear bilaterally Cardio: Regular rate and rhythm GI: No hepatomegaly, no mass, nontender Vascular: No leg edema Neuro: Mild decrease in vibratory sense at the fingertips bilaterally    Portacath/PICC-without erythema  Lab Results:  Lab Results  Component Value Date   WBC 7.1 01/15/2016   HGB 9.7* 01/15/2016   HCT 31.2* 01/15/2016   MCV 95.2 01/15/2016   PLT 97* 01/15/2016   NEUTROABS 4.7 01/15/2016     Imaging:  Ct Chest W Contrast  01/14/2016  CLINICAL DATA:  Pancreatic cancer restaging. Chemotherapy in progress. Recist 1.1 protocol. EXAM: CT CHEST, ABDOMEN, AND PELVIS WITH CONTRAST TECHNIQUE: Multidetector CT imaging of the chest, abdomen and pelvis was performed following the standard protocol during bolus administration of intravenous contrast. CONTRAST:  185mL ISOVUE-300 IOPAMIDOL (ISOVUE-300) INJECTION 61% COMPARISON:  CT 11/19/2015, 09/09/2015 RECIST 1.1 Target Lesions: 1. RIGHT hepatic lobe lesion measures 1.1 cm (image 29, series 2) compared to 2.1 cm. 2. Pancreatic tail mass measures 4.4 cm (image 34, series 2) compared to 5.0 cm Non-target Lesions: 1. Multiple hepatic metastasis - present 2. Gastrohepatic  ligament adenopathy (image 30, series 2) -present 3. Retroperitoneal lymphadenopathy (image 34, series 2) - present FINDINGS: CT CHEST FINDINGS Mediastinum/Nodes: Port in the RIGHT chest wall. No axillary or supraclavicular lymphadenopathy. RIGHT lower paratracheal lymph node measuring 8 mm unchanged. No central pulmonary embolism. No pericardial fluid. Esophagus normal. Lungs/Pleura: Mild linear atelectasis in the inferior RIGHT middle lobe. Several small subpleural nodules in the LEFT lower lobe unchanged. Musculoskeletal: No aggressive osseous lesion. CT ABDOMEN AND PELVIS FINDINGS Hepatobiliary: Hepatic lesions have decreased in size. The dominant lesion the RIGHT hepatic lobe measures 1.1 x 1.0 cm (image 29, series 2) compared to 2.1 x 2.0 cm. Lesion the RIGHT hepatic lobe on image 34, series 2 measures 6 mm compared to 10 mm. Other hepatic lesions are difficult to define. There is fluid within the gallbladder wall. Pancreas: Pancreatic head and proximal body are normal. There is again demonstrated mass in the pancreatic tail measuring 4.4 x 2.2 cm (image 34, series 2) compared to 5.0 x 2.7 cm. Spleen: Normal spleen Adrenals/urinary tract: Adrenal glands and kidneys are normal. The ureters and bladder normal. Stomach/Bowel: Stomach, small bowel, appendix, and cecum are normal. The colon and rectosigmoid colon are normal. Vascular/Lymphatic: Abdominal aorta normal caliber. Small gastrohepatic ligament lymph node measuring 7 mm (image 59, series 3) is decreased to 9 mm. There is retroperitoneal thickening adjacent to the celiac axis which is similar to comparison exam. This tissue is difficult to measure measure approximately 15 mm thick on image 61, series 3 compared to 16 mm remeasured. No new upper abdominal adenopathy. Reproductive: Uterus normal Other: No peritoneal nodularity.  No omental metastasis. Musculoskeletal: No aggressive osseous lesion.  IMPRESSION: Chest Impression: 1. No evidence of thoracic  metastasis. 2. Stable scattered pulmonary nodules. Abdomen / Pelvis Impression: 1. Interval decrease in size of pancreatic tail mass. 2. Interval decrease in size and number of hepatic metastasis. 3. Stable to mildly decreased upper abdominal lymphadenopathy. 4. No evidence of peritoneal disease or omental disease. 5. No evidence disease progression. Electronically Signed   By: Suzy Bouchard M.D.   On: 01/14/2016 17:48   Ct Abdomen Pelvis W Contrast  01/14/2016  CLINICAL DATA:  Pancreatic cancer restaging. Chemotherapy in progress. Recist 1.1 protocol. EXAM: CT CHEST, ABDOMEN, AND PELVIS WITH CONTRAST TECHNIQUE: Multidetector CT imaging of the chest, abdomen and pelvis was performed following the standard protocol during bolus administration of intravenous contrast. CONTRAST:  151mL ISOVUE-300 IOPAMIDOL (ISOVUE-300) INJECTION 61% COMPARISON:  CT 11/19/2015, 09/09/2015 RECIST 1.1 Target Lesions: 1. RIGHT hepatic lobe lesion measures 1.1 cm (image 29, series 2) compared to 2.1 cm. 2. Pancreatic tail mass measures 4.4 cm (image 34, series 2) compared to 5.0 cm Non-target Lesions: 1. Multiple hepatic metastasis - present 2. Gastrohepatic ligament adenopathy (image 30, series 2) -present 3. Retroperitoneal lymphadenopathy (image 34, series 2) - present FINDINGS: CT CHEST FINDINGS Mediastinum/Nodes: Port in the RIGHT chest wall. No axillary or supraclavicular lymphadenopathy. RIGHT lower paratracheal lymph node measuring 8 mm unchanged. No central pulmonary embolism. No pericardial fluid. Esophagus normal. Lungs/Pleura: Mild linear atelectasis in the inferior RIGHT middle lobe. Several small subpleural nodules in the LEFT lower lobe unchanged. Musculoskeletal: No aggressive osseous lesion. CT ABDOMEN AND PELVIS FINDINGS Hepatobiliary: Hepatic lesions have decreased in size. The dominant lesion the RIGHT hepatic lobe measures 1.1 x 1.0 cm (image 29, series 2) compared to 2.1 x 2.0 cm. Lesion the RIGHT hepatic lobe on  image 34, series 2 measures 6 mm compared to 10 mm. Other hepatic lesions are difficult to define. There is fluid within the gallbladder wall. Pancreas: Pancreatic head and proximal body are normal. There is again demonstrated mass in the pancreatic tail measuring 4.4 x 2.2 cm (image 34, series 2) compared to 5.0 x 2.7 cm. Spleen: Normal spleen Adrenals/urinary tract: Adrenal glands and kidneys are normal. The ureters and bladder normal. Stomach/Bowel: Stomach, small bowel, appendix, and cecum are normal. The colon and rectosigmoid colon are normal. Vascular/Lymphatic: Abdominal aorta normal caliber. Small gastrohepatic ligament lymph node measuring 7 mm (image 59, series 3) is decreased to 9 mm. There is retroperitoneal thickening adjacent to the celiac axis which is similar to comparison exam. This tissue is difficult to measure measure approximately 15 mm thick on image 61, series 3 compared to 16 mm remeasured. No new upper abdominal adenopathy. Reproductive: Uterus normal Other: No peritoneal nodularity.  No omental metastasis. Musculoskeletal: No aggressive osseous lesion. IMPRESSION: Chest Impression: 1. No evidence of thoracic metastasis. 2. Stable scattered pulmonary nodules. Abdomen / Pelvis Impression: 1. Interval decrease in size of pancreatic tail mass. 2. Interval decrease in size and number of hepatic metastasis. 3. Stable to mildly decreased upper abdominal lymphadenopathy. 4. No evidence of peritoneal disease or omental disease. 5. No evidence disease progression. Electronically Signed   By: Suzy Bouchard M.D.   On: 01/14/2016 17:48   CT images reviewed with Ms.Millhouse and her family   Medications: I have reviewed the patient's current medications.  Assessment/Plan: 1. Pancreas cancer  MRI abdomen 08/27/2015 revealed a pancreas mass, adenopathy, and liver metastases  CTs 09/10/2015 with pancreas body/tail mass, liver metastases, lymphadenopathy, innumerable tiny pulmonary  nodules  Ultrasound-guided biopsy of a right liver  lesion 09/10/2015 confirmed adenocarcinoma  Enrollment on the [employer] S1313 study, randomized to the standard FOLFIRINOX arm  Cycle 1 FOLFIRINOX 09/25/2015  Cycle 2 FOLFIRINOX 10/09/2015  Cycle 3 FOLFIRINOX 10/23/2015  Cycle 4 FOLFIRINOX 11/05/2015  CT 11/19/2015- decrease in the size of liver lesions and the pancreas mass, ? Increased upper abdominal/retroperitoneal adenopath  Cycle 5 FOLFIRINOX 11/20/2015  Cycle 6 FOLFIRINOX 12/04/2015  Cycle 7 FOLFIRINOX 12/18/2015  Cycle 8 FOLFIRINOX 01/01/2016  Restaging CTs 01/14/2016-decrease in size of liver lesions and pancreas mass, stable to decreased upper abdominal lymphadenopathy, no evidence of disease progression  Cycle 9 FOLFIRINOX 01/15/2016  2. Pain secondary to #1, improved  3. History of constipation-likely secondary to narcotics  4. 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   Mild mucositis secondary to chemotherapy   Disposition:  She has admitted 8 cycles of FOLFOX. Her pain is much improved and the restaging CT shows continued improvement in the liver metastases and pancreas mass. The plan is to continue FOLFOX.  She has developed mild oxaliplatin neuropathy and altered taste/anorexia. We will monitor her clinical status closely and decide on dose reduction, elimination of oxaliplatin from the regimen, or going to a 3 week interval as indicated.  She will return for an office visit and chemotherapy in 2 weeks. Her ECOG performance status is measured at 1 today.  [facility] 1 testing has been submitted on the liver biopsy material.  We will arrange for her to meet with the cancer center nutritionist.  Betsy Coder, MD  01/15/2016  12:48 PM

## 2016-01-15 NOTE — Telephone Encounter (Signed)
per pof to sch pt appt-sent MW email ot [facility] trmt-gave pt copyof avs

## 2016-01-16 LAB — CANCER ANTIGEN 19-9: CAN 19-9: 1 U/mL (ref 0–35)

## 2016-01-17 ENCOUNTER — Ambulatory Visit (HOSPITAL_BASED_OUTPATIENT_CLINIC_OR_DEPARTMENT_OTHER): Payer: Medicare Other

## 2016-01-17 ENCOUNTER — Ambulatory Visit: Payer: Medicare Other

## 2016-01-17 VITALS — BP 118/62 | HR 103 | Temp 98.2°F | Resp 16

## 2016-01-17 DIAGNOSIS — C252 Malignant neoplasm of tail of pancreas: Secondary | ICD-10-CM | POA: Diagnosis present

## 2016-01-17 MED ORDER — PEGFILGRASTIM INJECTION 6 MG/0.6ML ~~LOC~~
6.0000 mg | PREFILLED_SYRINGE | Freq: Once | SUBCUTANEOUS | Status: AC
  Administered 2016-01-17: 6 mg via SUBCUTANEOUS
  Filled 2016-01-17: qty 0.6

## 2016-01-17 MED ORDER — SODIUM CHLORIDE 0.9 % IJ SOLN
10.0000 mL | INTRAMUSCULAR | Status: DC | PRN
  Administered 2016-01-17: 10 mL
  Filled 2016-01-17: qty 10

## 2016-01-17 MED ORDER — HEPARIN SOD (PORK) LOCK FLUSH 100 UNIT/ML IV SOLN
500.0000 [IU] | Freq: Once | INTRAVENOUS | Status: AC | PRN
  Administered 2016-01-17: 500 [IU]
  Filled 2016-01-17: qty 5

## 2016-01-17 NOTE — Progress Notes (Signed)
Duplicate

## 2016-01-17 NOTE — Patient Instructions (Signed)
Pegfilgrastim injection What is this medicine? PEGFILGRASTIM (PEG fil gra stim) is a long-acting granulocyte colony-stimulating factor that stimulates the growth of neutrophils, a type of white blood cell important in the body's fight against infection. It is used to reduce the incidence of fever and infection in patients with certain types of cancer who are receiving chemotherapy that affects the bone marrow, and to increase survival after being exposed to high doses of radiation. This medicine may be used for other purposes; ask your health care provider or pharmacist if you have questions. What should I tell my health care provider before I take this medicine? They need to know if you have any of these conditions: -kidney disease -latex allergy -ongoing radiation therapy -sickle cell disease -skin reactions to acrylic adhesives (On-Body Injector only) -an unusual or allergic reaction to pegfilgrastim, filgrastim, other medicines, foods, dyes, or preservatives -pregnant or trying to get pregnant -breast-feeding How should I use this medicine? This medicine is for injection under the skin. If you get this medicine at home, you will be taught how to prepare and give the pre-filled syringe or how to use the On-body Injector. Refer to the patient Instructions for Use for detailed instructions. Use exactly as directed. Take your medicine at regular intervals. Do not take your medicine more often than directed. It is important that you put your used needles and syringes in a special sharps container. Do not put them in a trash can. If you do not have a sharps container, call your pharmacist or healthcare provider to get one. Talk to your pediatrician regarding the use of this medicine in children. While this drug may be prescribed for selected conditions, precautions do apply. Overdosage: If you think you have taken too much of this medicine contact a poison control center or emergency room at  once. NOTE: This medicine is only for you. Do not share this medicine with others. What if I miss a dose? It is important not to miss your dose. Call your doctor or health care professional if you miss your dose. If you miss a dose due to an On-body Injector failure or leakage, a new dose should be administered as soon as possible using a single prefilled syringe for manual use. What may interact with this medicine? Interactions have not been studied. Give your health care provider a list of all the medicines, herbs, non-prescription drugs, or dietary supplements you use. Also tell them if you smoke, drink alcohol, or use illegal drugs. Some items may interact with your medicine. This list may not describe all possible interactions. Give your health care provider a list of all the medicines, herbs, non-prescription drugs, or dietary supplements you use. Also tell them if you smoke, drink alcohol, or use illegal drugs. Some items may interact with your medicine. What should I watch for while using this medicine? You may need blood work done while you are taking this medicine. If you are going to need a MRI, CT scan, or other procedure, tell your doctor that you are using this medicine (On-Body Injector only). What side effects may I notice from receiving this medicine? Side effects that you should report to your doctor or health care professional as soon as possible: -allergic reactions like skin rash, itching or hives, swelling of the face, lips, or tongue -dizziness -fever -pain, redness, or irritation at site where injected -pinpoint red spots on the skin -red or dark-brown urine -shortness of breath or breathing problems -stomach or side pain, or pain   at the shoulder -swelling -tiredness -trouble passing urine or change in the amount of urine Side effects that usually do not require medical attention (report to your doctor or health care professional if they continue or are  bothersome): -bone pain -muscle pain This list may not describe all possible side effects. Call your doctor for medical advice about side effects. You may report side effects to FDA at 1-800-FDA-1088. Where should I keep my medicine? Keep out of the reach of children. Store pre-filled syringes in a refrigerator between 2 and 8 degrees C (36 and 46 degrees F). Do not freeze. Keep in carton to protect from light. Throw away this medicine if it is left out of the refrigerator for more than 48 hours. Throw away any unused medicine after the expiration date. NOTE: This sheet is a summary. It may not cover all possible information. If you have questions about this medicine, talk to your doctor, pharmacist, or health care provider.    2016, Elsevier/Gold Standard. (2014-09-06 14:30:14)  

## 2016-01-20 ENCOUNTER — Telehealth: Payer: Self-pay | Admitting: *Deleted

## 2016-01-20 NOTE — Telephone Encounter (Signed)
Received fax from [facility]: pt's sample was inadequate for testing. Consider [facility] testing with peripheral blood. Dr. Benay Spice made aware.

## 2016-01-22 ENCOUNTER — Telehealth: Payer: Self-pay | Admitting: Oncology

## 2016-01-22 ENCOUNTER — Encounter: Payer: Self-pay | Admitting: *Deleted

## 2016-01-22 ENCOUNTER — Telehealth: Payer: Self-pay

## 2016-01-22 NOTE — Progress Notes (Signed)
Message received from pt.'s daughter, Esmond Camper this morning stating that she is concerned about her mother "not eating, not being able to walk without help and crying all the time" and is asking if feeding tube is appropriate at this time.  Call placed to patient to check on her status.  Pt states that she has no appetite, but is drinking 3-4 sixteen ounce bottles of water a day.  She states that she is having no pain or nausea at this time.  She states that she has mouth sores with white patches in her mouth.  She is currently using Oragel only.  When patient asked about what medications she is currently taking, she was not sure.  She stated that she is taking Dilaudid, Compazine, Ativan and Zofran together at bedtime.  Pt instructed to not take all four medications at the same time.  She stated that she was told to do that by someone at this office.  Pt is not clear on what medications she is taking and frequently changes her list of what she is taking stating that she "thinks she only takes half of the pain medicine and half of the nausea medicine."  Per Dr. Benay Spice, pt can be seen now or this afternoon by provider.  When pt instructed of MD orders, she stated that she does not want to come in at this time, will take a nap now and call us back after lunch in hope that she "feels better at that time."  1:15PM- No call back from patient at this time.  Call placed to check on patient with no answer.  Message left on patients private voice mail to call [facility] as soon as she got the message.  3:00PM-Return call placed to patient with answer this time.  Pt states that she does not want to come in this afternoon to be seen.  She is still having trouble recalling what medications she takes and has taken.  Dilaudid, Compazine, Zofran and Ativan directions reviewed with patient.  She verbalizes an understanding to not take all four medications together before she goes to bed.  Pt refusing to come in this afternoon but  was agreeable to coming in tomorrow morning at 8:45AM to see L. Marcello Moores NP.  Pt states that she would like a later appt but she was instructed that it be best for her to come early, so that she be assessed as early as possible.    3:10-Call placed to Esmond Camper, patients daughter to inform her of conversation with her mother and appt for tomorrow morning at 8:45AM with L. Marcello Moores NP.  Esmond Camper stated that she will call her Uncle for transportation for patient in the morning.  [name] lives in [place] and is flying in tomorrow evening to be with her mother.    3:30-Pt left message to cancel appt for the morning.  Message left with Esmond Camper to inform her of mother's call to cancel appt and that we are going to keep appt as scheduled unless we hear back from [place] that transportation is an issue for pt in the morning.  4:24PM-Call placed to Esmond Camper to confirm that patient will be here tomorrow at 8:45AM.  Per Esmond Camper, transportation is arranged and patient will be here at 8:45AM to see L. Marcello Moores NP.

## 2016-01-22 NOTE — Telephone Encounter (Signed)
Pt called returning Barb Neff's call, no answer on Barb's line.

## 2016-01-22 NOTE — Telephone Encounter (Signed)
Apt added for 8:45 .Marland Kitchen Pt aware

## 2016-01-22 NOTE — Progress Notes (Signed)
01/22/2016 0900 Received a voice message from patient's daughter, Esmond Camper, in [place], stating her mother is not eating well and is inquiring about a feeding tube etc.  She is asking for an appointment to bring in.  Message forwarded to Dr. Gearldine Shown nurse. Spoke with Dayle Points, RN who confirmed she received the message and  will inform Dr. Benay Spice. She will contact patient and/or her daughter with instructions after notifying Dr. Benay Spice.

## 2016-01-23 ENCOUNTER — Telehealth: Payer: Self-pay | Admitting: Oncology

## 2016-01-23 ENCOUNTER — Ambulatory Visit (HOSPITAL_BASED_OUTPATIENT_CLINIC_OR_DEPARTMENT_OTHER): Payer: Medicare Other | Admitting: Nurse Practitioner

## 2016-01-23 ENCOUNTER — Telehealth: Payer: Self-pay | Admitting: *Deleted

## 2016-01-23 VITALS — BP 134/49 | HR 90 | Temp 98.0°F | Resp 20 | Ht 62.0 in | Wt 108.0 lb

## 2016-01-23 DIAGNOSIS — R627 Adult failure to thrive: Secondary | ICD-10-CM

## 2016-01-23 DIAGNOSIS — C252 Malignant neoplasm of tail of pancreas: Secondary | ICD-10-CM

## 2016-01-23 DIAGNOSIS — E860 Dehydration: Secondary | ICD-10-CM

## 2016-01-23 MED ORDER — SODIUM CHLORIDE 0.9 % IV SOLN
Freq: Once | INTRAVENOUS | Status: AC
  Administered 2016-01-23: 11:00:00 via INTRAVENOUS

## 2016-01-23 NOTE — Telephone Encounter (Signed)
per pof to sch pt appt-MW sch trmt-gave pt copy of avs °

## 2016-01-23 NOTE — Progress Notes (Addendum)
[facility] OFFICE PROGRESS NOTE   Diagnosis: Pancreas cancer   INTERVAL HISTORY:   Ms. Dimartino returns prior to scheduled follow-up. She completed cycle 9 FOLFIRINOX 01/15/2016. She denies nausea/vomiting. She has a single mouth sore at the right buccal mucosa. No diarrhea. She denies persistent neuropathy symptoms. She has mild low back pain at nighttime. She takes Dilaudid before going to bed. If she needs pain medication during the day she takes hydrocodone. She is also taking Ativan and Zofran at bedtime. She is not sure why. She does not feel nauseated at bedtime and is not having trouble sleeping. She feels weak. Her appetite is poor. The symptoms have progressively worsened over the past several weeks. She has noted some lightheadedness with position change. She wonders if she should discontinue her blood pressure medication. She feels she is drinking adequate fluids. No fever. No cough. She has stable mild dyspnea on exertion.  Objective:  Vital signs in last 24 hours:  Blood pressure 106/44, pulse 110, temperature 98 F (36.7 C), temperature source Oral, resp. rate 20, height 5\' 2"  (1.575 m), weight 108 lb (48.988 kg), SpO2 98 %. Repeat heart rate 96    HEENT: No thrush or ulcers. Mucous membranes are moist. Resp: Lungs clear bilaterally. Cardio: Regular rate and rhythm. GI: Abdomen soft and nontender. No hepatomegaly. No mass. Vascular: No leg edema. Calves soft and nontender. Neuro: Alert and oriented. Motor strength 5 over 5. Finger to nose intact.  Skin: Warm and dry. No rash. Port-A-Cath without erythema.    Lab Results:  Lab Results  Component Value Date   WBC 7.1 01/15/2016   HGB 9.7* 01/15/2016   HCT 31.2* 01/15/2016   MCV 95.2 01/15/2016   PLT 97* 01/15/2016   NEUTROABS 4.7 01/15/2016    Imaging:  No results found.  Medications: I have reviewed the patient's current medications.  Assessment/Plan: 1. Pancreas cancer  MRI abdomen  08/27/2015 revealed a pancreas mass, adenopathy, and liver metastases  CTs 09/10/2015 with pancreas body/tail mass, liver metastases, lymphadenopathy, innumerable tiny pulmonary nodules  Ultrasound-guided biopsy of a right liver lesion 09/10/2015 confirmed adenocarcinoma  Enrollment on the SWOG S1313 study, randomized to the standard FOLFIRINOX arm  Cycle 1 FOLFIRINOX 09/25/2015  Cycle 2 FOLFIRINOX 10/09/2015  Cycle 3 FOLFIRINOX 10/23/2015  Cycle 4 FOLFIRINOX 11/05/2015  CT 11/19/2015- decrease in the size of liver lesions and the pancreas mass, ? Increased upper abdominal/retroperitoneal adenopath  Cycle 5 FOLFIRINOX 11/20/2015  Cycle 6 FOLFIRINOX 12/04/2015  Cycle 7 FOLFIRINOX 12/18/2015  Cycle 8 FOLFIRINOX 01/01/2016  Restaging CTs 01/14/2016-decrease in size of liver lesions and pancreas mass, stable to decreased upper abdominal lymphadenopathy, no evidence of disease progression  Cycle 9 FOLFIRINOX 01/15/2016 2. Pain secondary to #1, improved  3. History of constipation-likely secondary to narcotics  4. 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 Mild mucositis secondary to chemotherapy   Disposition: Ms. Batta has completed 9 cycles of FOLFIRINOX. She presents today with a several week history of progressive fatigue/malaise. This is likely due to cumulative toxicity from chemotherapy. In addition her blood pressure is mildly low on antihypertensive therapy and she is taking multiple medications that can cause sedation.  We decided to adjust the chemotherapy schedule from 2 weeks to 3 weeks and will thus cancel next week's treatment and reschedule to 02/05/2016. She will discontinue Norvasc. She will push fluids. We reviewed her medication list extensively. She understands to take as needed medications truly as needed rather than scheduled. We specifically discussed  the sedating side effects associated with pain medications and  benzodiazepines.  She appeared unsteady as she was leaving the office. Vital signs showed mild orthostasis. She will receive a liter of IV fluids.  She will return for a follow-up visit and chemotherapy on 02/05/2016. She will contact the office in the interim with any problems.  Patient seen with Dr. Benay Spice. 30 minutes were spent face-to-face at today's visit with the majority of that time involved in counseling/coordinating of care.    Ned Card ANP/GNP-BC   01/23/2016  9:17 AM  This was a shared visit with Ned Card. Ms. Suttle has developed failure to thrive, likely secondary to chemotherapy to toxicity from the FOLFIRINOX chemotherapy. She received intravenous fluids in the office today. We will delay the next cycle of chemotherapy for 1 week.  Julieanne Manson, M.D.

## 2016-01-23 NOTE — Patient Instructions (Signed)

## 2016-01-23 NOTE — Telephone Encounter (Signed)
Returned patient's call. She was not available.  I will follow up with patient on June 14 during infusion.

## 2016-01-23 NOTE — Telephone Encounter (Signed)
Per staff message and POF I have scheduled appts. Advised scheduler of appts. JMW  

## 2016-01-24 ENCOUNTER — Encounter: Payer: Self-pay | Admitting: *Deleted

## 2016-01-24 DIAGNOSIS — C252 Malignant neoplasm of tail of pancreas: Secondary | ICD-10-CM

## 2016-01-24 NOTE — Progress Notes (Signed)
01/24/16 1115 SWOG [id number] study The patient had an unplanned visit to the clinic on 01/23/16.   The patient was seen by the NP and MD.  The patient received fluids for hypotension, weakness, and dehydration.  See clinic note from 01/23/16 for all toxicities. This RN spoke with Dr.Sherrill on 01/24/16 to confirm the treatment plan going forward.  Per Dr. Benay Spice, "the patient will return to clinic on 01/29/16 for labs and assessment.  Will decide next steps based on results.  For now, the patient continues on the protocol". Marcellus Scott, RN, BSN, MHA, OCN

## 2016-01-29 ENCOUNTER — Other Ambulatory Visit: Payer: Medicare Other

## 2016-01-29 ENCOUNTER — Other Ambulatory Visit: Payer: Self-pay | Admitting: Nurse Practitioner

## 2016-01-29 ENCOUNTER — Telehealth: Payer: Self-pay | Admitting: Oncology

## 2016-01-29 ENCOUNTER — Encounter: Payer: Self-pay | Admitting: *Deleted

## 2016-01-29 ENCOUNTER — Other Ambulatory Visit (HOSPITAL_BASED_OUTPATIENT_CLINIC_OR_DEPARTMENT_OTHER): Payer: Medicare Other

## 2016-01-29 ENCOUNTER — Ambulatory Visit: Payer: Medicare Other | Admitting: Oncology

## 2016-01-29 ENCOUNTER — Ambulatory Visit (HOSPITAL_BASED_OUTPATIENT_CLINIC_OR_DEPARTMENT_OTHER): Payer: Self-pay | Admitting: Nurse Practitioner

## 2016-01-29 ENCOUNTER — Ambulatory Visit (HOSPITAL_BASED_OUTPATIENT_CLINIC_OR_DEPARTMENT_OTHER): Payer: Medicare Other | Admitting: Oncology

## 2016-01-29 ENCOUNTER — Ambulatory Visit: Payer: Medicare Other

## 2016-01-29 VITALS — BP 108/51 | HR 96 | Temp 98.0°F | Resp 18 | Ht 62.0 in | Wt 109.6 lb

## 2016-01-29 DIAGNOSIS — K1230 Oral mucositis (ulcerative), unspecified: Secondary | ICD-10-CM | POA: Diagnosis not present

## 2016-01-29 DIAGNOSIS — R627 Adult failure to thrive: Secondary | ICD-10-CM

## 2016-01-29 DIAGNOSIS — F329 Major depressive disorder, single episode, unspecified: Secondary | ICD-10-CM | POA: Diagnosis not present

## 2016-01-29 DIAGNOSIS — Z006 Encounter for examination for normal comparison and control in clinical research program: Secondary | ICD-10-CM

## 2016-01-29 DIAGNOSIS — C252 Malignant neoplasm of tail of pancreas: Secondary | ICD-10-CM

## 2016-01-29 DIAGNOSIS — C787 Secondary malignant neoplasm of liver and intrahepatic bile duct: Secondary | ICD-10-CM

## 2016-01-29 DIAGNOSIS — S0181XA Laceration without foreign body of other part of head, initial encounter: Secondary | ICD-10-CM

## 2016-01-29 LAB — CBC WITH DIFFERENTIAL/PLATELET
BASO%: 0.4 % (ref 0.0–2.0)
BASOS ABS: 0 10*3/uL (ref 0.0–0.1)
EOS ABS: 0.4 10*3/uL (ref 0.0–0.5)
EOS%: 4.8 % (ref 0.0–7.0)
HCT: 30.9 % — ABNORMAL LOW (ref 34.8–46.6)
HGB: 9.5 g/dL — ABNORMAL LOW (ref 11.6–15.9)
LYMPH%: 19.3 % (ref 14.0–49.7)
MCH: 30.5 pg (ref 25.1–34.0)
MCHC: 30.7 g/dL — ABNORMAL LOW (ref 31.5–36.0)
MCV: 99.4 fL (ref 79.5–101.0)
MONO#: 1.4 10*3/uL — ABNORMAL HIGH (ref 0.1–0.9)
MONO%: 16.7 % — AB (ref 0.0–14.0)
NEUT#: 4.9 10*3/uL (ref 1.5–6.5)
NEUT%: 58.8 % (ref 38.4–76.8)
NRBC: 0 % (ref 0–0)
PLATELETS: 80 10*3/uL — AB (ref 145–400)
RBC: 3.11 10*6/uL — AB (ref 3.70–5.45)
RDW: 21.2 % — AB (ref 11.2–14.5)
WBC: 8.3 10*3/uL (ref 3.9–10.3)
lymph#: 1.6 10*3/uL (ref 0.9–3.3)

## 2016-01-29 LAB — COMPREHENSIVE METABOLIC PANEL
ALK PHOS: 203 U/L — AB (ref 40–150)
ALT: 27 U/L (ref 0–55)
ANION GAP: 9 meq/L (ref 3–11)
AST: 33 U/L (ref 5–34)
Albumin: 3 g/dL — ABNORMAL LOW (ref 3.5–5.0)
BILIRUBIN TOTAL: 0.38 mg/dL (ref 0.20–1.20)
BUN: 10.9 mg/dL (ref 7.0–26.0)
CALCIUM: 9.4 mg/dL (ref 8.4–10.4)
CO2: 28 meq/L (ref 22–29)
Chloride: 103 mEq/L (ref 98–109)
Creatinine: 0.8 mg/dL (ref 0.6–1.1)
EGFR: 80 mL/min/{1.73_m2} — AB (ref >90)
Glucose: 140 mg/dl (ref 70–140)
Potassium: 5 mEq/L (ref 3.5–5.1)
Sodium: 140 mEq/L (ref 136–145)
TOTAL PROTEIN: 6 g/dL — AB (ref 6.4–8.3)

## 2016-01-29 MED ORDER — HYDROMORPHONE HCL 4 MG PO TABS: 4.0000 mg | ORAL_TABLET | ORAL | Status: DC | PRN

## 2016-01-29 NOTE — Progress Notes (Signed)
01/29/2016 1520 [id number] Per review of protocol, section 16.2 , no expedited reporting required for the adverse events patient has experienced as of this date. Marcellus Scott, RN, BSN, MHA, OCN

## 2016-01-29 NOTE — Progress Notes (Signed)
  [facility] OFFICE PROGRESS NOTE   Diagnosis: Pancreas cancer  INTERVAL HISTORY:   Wendy Palmer returns for a scheduled visit. We saw her on 01/23/2016 when she had therapy to thrive and dehydration. She received intravenous fluids. She continues to have weakness, but she feels better than last week. She had significant mouth soreness and right tongue ulceration following the cycle of chemotherapy given 01/15/2016. Cold sensitivity lasted 3-4 days per she denies neuropathy symptoms at present. The abdomen/back pain is improved. She takes Dilaudid in the evening.  Wendy Palmer tripped over a rug last night and lacerated the chin.  Objective:  Vital signs in last 24 hours:  Blood pressure 108/51, pulse 96, temperature 98 F (36.7 C), temperature source Oral, resp. rate 18, height 5\' 2"  (1.575 m), weight 109 lb 9.6 oz (49.714 kg), SpO2 100 %.    HEENT: Erythema at the posterior palate and buccal mucosa. Healed ulcer at the lateral right tongue. No thrush. Resp: Lungs clear bilaterally Cardio: Regular rate and rhythm GI: No mass, no hepatomegaly, nontender Vascular: No leg edema Neuro: Mild decrease in vibratory sense at the fingertips bilaterally  Skin: 2 cm laceration at the chin, superficially open without active bleeding   Portacath/PICC-without erythema  Lab Results:  Lab Results  Component Value Date   WBC 8.3 01/29/2016   HGB 9.5* 01/29/2016   HCT 30.9* 01/29/2016   MCV 99.4 01/29/2016   PLT 80* 01/29/2016   NEUTROABS 4.9 01/29/2016     Medications: I have reviewed the patient's current medications.  Assessment/Plan: 1. Pancreas cancer  MRI abdomen 08/27/2015 revealed a pancreas mass, adenopathy, and liver metastases  CTs 09/10/2015 with pancreas body/tail mass, liver metastases, lymphadenopathy, innumerable tiny pulmonary nodules  Ultrasound-guided biopsy of a right liver lesion 09/10/2015 confirmed adenocarcinoma  Enrollment on the SWOG S1313  study, randomized to the standard FOLFIRINOX arm  Cycle 1 FOLFIRINOX 09/25/2015  Cycle 2 FOLFIRINOX 10/09/2015  Cycle 3 FOLFIRINOX 10/23/2015  Cycle 4 FOLFIRINOX 11/05/2015  CT 11/19/2015- decrease in the size of liver lesions and the pancreas mass, ? Increased upper abdominal/retroperitoneal adenopath  Cycle 5 FOLFIRINOX 11/20/2015  Cycle 6 FOLFIRINOX 12/04/2015  Cycle 7 FOLFIRINOX 12/18/2015  Cycle 8 FOLFIRINOX 01/01/2016  Restaging CTs 01/14/2016-decrease in size of liver lesions and pancreas mass, stable to decreased upper abdominal lymphadenopathy, no evidence of disease progression  Cycle 9 FOLFIRINOX 01/15/2016 2. Pain secondary to #1, improved  3. History of constipation-likely secondary to narcotics  4. 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  Chin laceration-she was evaluated by the symptom management nurse practitioner today and was treated with/Steri-Strips  7.  Depression  Disposition:  Wendy Palmer has completed 9 treatments with FOLFIRINOX. She has developed malaise, anorexia, and failure to thrive over the past few weeks. I suspect her symptoms are related to chemotherapy toxicity from chemotherapy. She has  developed mucositis. She appears to have grade 3 depression.  We decided to give her a break from treatment until 02/12/2016. I think she would have a significant further decline in her performance status if she were treated today.  She is interested in proceeding with peripheral blood mutation testing and she will be referred for genetics counseling.  Her ECOG performance status is measured at 1 today. We will consider changing the antidepressant and referring her for counseling if the depression symptoms are not improved 02/12/2016.  Betsy Coder, MD  01/29/2016  12:26 PM

## 2016-01-29 NOTE — Progress Notes (Signed)
01/29/2016 1135 S1313 cycle 10, day 1 The patient returns accompanied by her daughter, Esmond Camper and friend, Shanon Brow.  She is feeling a little better than last week's unplanned visit on 01/23/16. The patient's daughter states her mother fell last night at 10 pm.  The patient stated she tripped over a rug while going to check on her dog.  She has a bloody band aide over the area on her lower chin.  She stated there is a cut there and that it is in an area where she has had a skin graft done.  The patient denies hitting her head or having any bleeding,  bruises or cuts in other areas from the fall.  The patient denies dizziness and states she was not dizzy last night when she fell. The patient reports feeling fatigue and has dyspnea on exertion.  She says she has had crying spells/episodes and that she is feeling low.  She denies any feelings of self-harm.  She states she does not have an appetite and her daughter reports that she is having to "make her mother eat".  The patient reports being able to drink more fluids and that she really likes Gatorade.  She reports mouth soreness and a few sores, but that her mouth is better today compared to last week.  She still has altered taste.   She denies nausea, vomiting, diarrhea, or constipation.    The patient's weight today is stable compared to last treatment weight.   The patient's daughter reports that her mother has been "depressed" and that she has had difficulty with self-care, especially eating and that she is unable to make meals, etc.  Her daughter states that they are making sure someone is with her mother most of the time. Dr. Benay Spice was in to examine patient and was made aware of above symptoms.  MD performed a physical exam.  After exam and review of labs, Dr. Benay Spice discussed with the patient his recommendation to hold today's treatment due to toxicities and to come back in 2 weeks to re-evaluate. The patient was comfortable with the plan and agreed.  She  stated she was glad to get a 2 week break. The patient was seen by C. Berniece Salines, NP who was able to take care of the patient's chin laceration and applied steri-strips. The patient verbalized understanding to call for any worsening side effects, including bleeding, pain, fever, etc.  Patient and family aware and agree with the plan of care. Above plan reviewed with second research RN, Warner Mccreedy. Marcellus Scott, RN, BSN, MHA, OCN

## 2016-01-29 NOTE — Telephone Encounter (Signed)
Gave pt apt & avs °

## 2016-01-29 NOTE — Telephone Encounter (Signed)
per of to CX pt 6/7 & 6/9 appt per MD

## 2016-01-30 ENCOUNTER — Encounter: Payer: Self-pay | Admitting: Nurse Practitioner

## 2016-01-30 DIAGNOSIS — S0181XA Laceration without foreign body of other part of head, initial encounter: Secondary | ICD-10-CM | POA: Insufficient documentation

## 2016-01-30 NOTE — Assessment & Plan Note (Signed)
Patient states that she tripped over a rug last night and fell hitting her chin.  She has an approximately 2 cm laceration to her chin.  She denies hitting her head or any loss of consciousness with the fall.  Patient states she has full range of motion with her job.  Exam today reveals an approximately 2 cm partial-thickness laceration are solidly to the chin.  There is no surrounding infection to the site.  Area was flushed with sterile saline.  Area was then dried; and skin glue was applied to all approximated skin.  Then, 2 Steri-Strips were applied for additional support to the chin area.  Patient was advised to call/return if she develops any worsening symptoms whatsoever.

## 2016-01-30 NOTE — Progress Notes (Signed)
SYMPTOM MANAGEMENT CLINIC    Chief Complaint: Chin laceration  HPI:  Wendy Palmer 65 y.o. female diagnosed with pancreatic cancer.  Currently undergoing research folfirinox chemotherapy regimen.   Patient states that she tripped over a rug last night and fell hitting her chin.  She has an approximately 2 cm laceration to her chin.  She denies hitting her head or any loss of consciousness with the fall.  Patient states she has full range of motion with her job.  Exam today reveals an approximately 2 cm partial-thickness laceration are solidly to the chin.  There is no surrounding infection to the site.  Area was flushed with sterile saline.  Area was then dried; and skin glue was applied to all approximated skin.  Then, 2 Steri-Strips were applied for additional support to the chin area.  Patient was advised to call/return if she develops any worsening symptoms whatsoever.   No history exists.    ROS  Past Medical History  Diagnosis Date  . Diverticulosis   . Depression   . Patella fracture   . Hypertension     Past Surgical History  Procedure Laterality Date  . Tubal ligation    . Refractive surgery      has Rosacea; Essential hypertension; Hyperlipemia; Hot flashes, menopausal; Cancer of pancreas, tail ([place]); Liver mass; and Chin laceration on her problem list.    is allergic to macrobid and ace inhibitors.    Medication List       This list is accurate as of: 01/29/16 11:59 PM.  Always use your most recent med list.               acetaminophen-codeine 300-30 MG tablet  Commonly known as:  TYLENOL #3  Reported on 01/23/2016     amLODipine 2.5 MG tablet  Commonly known as:  NORVASC  TAKE 1 TABLET (2.5 MG TOTAL) BY MOUTH DAILY.     aspirin 81 MG tablet  Take 81 mg by mouth daily. Reported on 01/15/2016     calcium carbonate 500 MG chewable tablet  Commonly known as:  TUMS - dosed in mg elemental calcium  Chew 1 tablet by mouth daily as needed for  indigestion or heartburn.     docusate sodium 100 MG capsule  Commonly known as:  COLACE  Take 100 mg by mouth daily as needed.     famotidine 20 MG tablet  Commonly known as:  PEPCID  One at bedtime     HYDROcodone-acetaminophen 5-325 MG tablet  Commonly known as:  NORCO/VICODIN  Take 1 tablet by mouth every 6 (six) hours as needed for moderate pain. Take 1 tablet 4 times daily as needed     HYDROmorphone 4 MG tablet  Commonly known as:  DILAUDID  Take 1-2 tablets (4-8 mg total) by mouth every 4 (four) hours as needed for severe pain.     imipramine 50 MG tablet  Commonly known as:  TOFRANIL  Take 2 tablets (100 mg total) by mouth at bedtime.     lidocaine-prilocaine cream  Commonly known as:  EMLA  Apply to port site one hour prior to use. Do not rub in. Cover with plastic.     LORazepam 0.5 MG tablet  Commonly known as:  ATIVAN  Take 1 tablet (0.5 mg total) by mouth 3 (three) times daily as needed for anxiety.     magic mouthwash Soln  Take 5-10 mLs by mouth 4 (four) times daily as needed for mouth pain.  magnesium citrate solution  Take 148 mLs by mouth as directed. Try 1/2 bottle at bedtime and repeat next day if no BM     ondansetron 8 MG tablet  Commonly known as:  ZOFRAN  Take 1 tablet (8 mg total) by mouth every 8 (eight) hours as needed for nausea or vomiting.     polyethylene glycol packet  Commonly known as:  MIRALAX / GLYCOLAX  Take 17 g by mouth daily.     prochlorperazine 10 MG tablet  Commonly known as:  COMPAZINE  TAKE 1 TABLET (10 MG TOTAL) BY MOUTH EVERY 6 (SIX) HOURS AS NEEDED FOR NAUSEA OR VOMITING.         PHYSICAL EXAMINATION  Oncology Vitals 01/29/2016 01/23/2016  Height 158 cm -  Weight 49.714 kg -  Weight (lbs) 109 lbs 10 oz -  BMI (kg/m2) 20.05 kg/m2 -  Temp 98 -  Pulse 96 90  Resp 18 -  SpO2 100 -  BSA (m2) 1.47 m2 -   BP Readings from Last 2 Encounters:  01/29/16 108/51  01/23/16 134/49    Physical Exam    Constitutional: She is oriented to person, place, and time and well-developed, well-nourished, and in no distress.  HENT:  Head: Normocephalic.  See skin note.   Eyes: Conjunctivae and EOM are normal. Pupils are equal, round, and reactive to light. Right eye exhibits no discharge. Left eye exhibits no discharge. No scleral icterus.  Pulmonary/Chest: Effort normal. No respiratory distress.  Musculoskeletal: Normal range of motion. She exhibits no edema or tenderness.  Neurological: She is alert and oriented to person, place, and time. Gait normal.  Skin: Skin is warm and dry. Laceration noted. No rash noted. No erythema. No pallor.     Exam today reveals an approximately 2 cm partial-thickness laceration are solidly to the chin.  There is no surrounding infection to the site.     Psychiatric: Affect normal.  Nursing note and vitals reviewed.   LABORATORY DATA:. Appointment on 01/29/2016  Component Date Value Ref Range Status  . WBC 01/29/2016 8.3  3.9 - 10.3 10e3/uL Final  . NEUT# 01/29/2016 4.9  1.5 - 6.5 10e3/uL Final  . HGB 01/29/2016 9.5* 11.6 - 15.9 g/dL Final  . HCT 01/29/2016 30.9* 34.8 - 46.6 % Final  . Platelets 01/29/2016 80* 145 - 400 10e3/uL Final  . MCV 01/29/2016 99.4  79.5 - 101.0 fL Final  . MCH 01/29/2016 30.5  25.1 - 34.0 pg Final  . MCHC 01/29/2016 30.7* 31.5 - 36.0 g/dL Final  . RBC 01/29/2016 3.11* 3.70 - 5.45 10e6/uL Final  . RDW 01/29/2016 21.2* 11.2 - 14.5 % Final  . lymph# 01/29/2016 1.6  0.9 - 3.3 10e3/uL Final  . MONO# 01/29/2016 1.4* 0.1 - 0.9 10e3/uL Final  . Eosinophils Absolute 01/29/2016 0.4  0.0 - 0.5 10e3/uL Final  . Basophils Absolute 01/29/2016 0.0  0.0 - 0.1 10e3/uL Final  . NEUT% 01/29/2016 58.8  38.4 - 76.8 % Final  . LYMPH% 01/29/2016 19.3  14.0 - 49.7 % Final  . MONO% 01/29/2016 16.7* 0.0 - 14.0 % Final  . EOS% 01/29/2016 4.8  0.0 - 7.0 % Final  . BASO% 01/29/2016 0.4  0.0 - 2.0 % Final  . nRBC 01/29/2016 0  0 - 0 % Final  . Sodium  01/29/2016 140  136 - 145 mEq/L Final  . Potassium 01/29/2016 5.0  3.5 - 5.1 mEq/L Final  . Chloride 01/29/2016 103  98 - 109 mEq/L Final  .  CO2 01/29/2016 28  22 - 29 mEq/L Final  . Glucose 01/29/2016 140  70 - 140 mg/dl Final   Glucose reference range is for nonfasting patients. Fasting glucose reference range is 70- 100.  Marland Kitchen BUN 01/29/2016 10.9  7.0 - 26.0 mg/dL Final  . Creatinine 01/29/2016 0.8  0.6 - 1.1 mg/dL Final  . Total Bilirubin 01/29/2016 0.38  0.20 - 1.20 mg/dL Final  . Alkaline Phosphatase 01/29/2016 203* 40 - 150 U/L Final  . AST 01/29/2016 33  5 - 34 U/L Final  . ALT 01/29/2016 27  0 - 55 U/L Final  . Total Protein 01/29/2016 6.0* 6.4 - 8.3 g/dL Final  . Albumin 01/29/2016 3.0* 3.5 - 5.0 g/dL Final  . Calcium 01/29/2016 9.4  8.4 - 10.4 mg/dL Final  . Anion Gap 01/29/2016 9  3 - 11 mEq/L Final  . EGFR 01/29/2016 80* >90 ml/min/1.73 m2 Final   eGFR is calculated using the CKD-EPI Creatinine Equation (2009)    RADIOGRAPHIC STUDIES: No results found.  ASSESSMENT/PLAN:    Cancer of pancreas, tail ([place]) Patient presented to the [facility] today to receive cycle 10 of her research Folfirinox chemotherapy regimen; but her chemotherapy was held today secondary to toxicities.  See Dr. Gearldine Shown note from today's visit.  For further details.  Patient will be scheduled to return for labs, flush, visit, and chemotherapy on 02/12/2016.  Chin laceration Patient states that she tripped over a rug last night and fell hitting her chin.  She has an approximately 2 cm laceration to her chin.  She denies hitting her head or any loss of consciousness with the fall.  Patient states she has full range of motion with her job.  Exam today reveals an approximately 2 cm partial-thickness laceration are solidly to the chin.  There is no surrounding infection to the site.  Area was flushed with sterile saline.  Area was then dried; and skin glue was applied to all approximated skin.  Then,  2 Steri-Strips were applied for additional support to the chin area.  Patient was advised to call/return if she develops any worsening symptoms whatsoever.   Patient stated understanding of all instructions; and was in agreement with this plan of care. The patient knows to call the clinic with any problems, questions or concerns.   Total time spent with patient was 25 minutes;  with greater than 75 percent of that time spent in face to face counseling regarding patient's symptoms,  and coordination of care and follow up.  Disclaimer:This dictation was prepared with Dragon/digital dictation along with [employer]. Any transcriptional errors that result from this process are unintentional.  Drue Second, NP 01/30/2016

## 2016-01-30 NOTE — Assessment & Plan Note (Signed)
Patient presented to the [facility] today to receive cycle 10 of her research Folfirinox chemotherapy regimen; but her chemotherapy was held today secondary to toxicities.  See Dr. Gearldine Shown note from today's visit.  For further details.  Patient will be scheduled to return for labs, flush, visit, and chemotherapy on 02/12/2016.

## 2016-01-31 ENCOUNTER — Ambulatory Visit: Payer: Medicare Other

## 2016-02-03 ENCOUNTER — Encounter: Payer: Self-pay | Admitting: Oncology

## 2016-02-04 ENCOUNTER — Other Ambulatory Visit: Payer: Self-pay | Admitting: *Deleted

## 2016-02-04 ENCOUNTER — Encounter: Payer: Self-pay | Admitting: *Deleted

## 2016-02-04 DIAGNOSIS — C252 Malignant neoplasm of tail of pancreas: Secondary | ICD-10-CM

## 2016-02-05 ENCOUNTER — Other Ambulatory Visit: Payer: Medicare Other

## 2016-02-05 ENCOUNTER — Ambulatory Visit: Payer: Medicare Other

## 2016-02-05 ENCOUNTER — Ambulatory Visit: Payer: Medicare Other | Admitting: Nurse Practitioner

## 2016-02-12 ENCOUNTER — Ambulatory Visit (HOSPITAL_BASED_OUTPATIENT_CLINIC_OR_DEPARTMENT_OTHER): Payer: Medicare Other

## 2016-02-12 ENCOUNTER — Other Ambulatory Visit (HOSPITAL_COMMUNITY)
Admission: RE | Admit: 2016-02-12 | Discharge: 2016-02-12 | Disposition: A | Discharge status: Home / Self Care | Payer: Medicare Other | Source: Ambulatory Visit | Attending: Oncology | Admitting: Oncology

## 2016-02-12 ENCOUNTER — Telehealth: Payer: Self-pay | Admitting: Oncology

## 2016-02-12 ENCOUNTER — Ambulatory Visit (HOSPITAL_BASED_OUTPATIENT_CLINIC_OR_DEPARTMENT_OTHER): Payer: Medicare Other | Admitting: Oncology

## 2016-02-12 ENCOUNTER — Encounter: Payer: Self-pay | Admitting: *Deleted

## 2016-02-12 ENCOUNTER — Ambulatory Visit: Payer: Medicare Other | Admitting: Nutrition

## 2016-02-12 VITALS — BP 125/50 | HR 94 | Temp 98.6°F | Resp 18 | Ht 62.0 in | Wt 111.4 lb

## 2016-02-12 DIAGNOSIS — C787 Secondary malignant neoplasm of liver and intrahepatic bile duct: Secondary | ICD-10-CM | POA: Diagnosis not present

## 2016-02-12 DIAGNOSIS — Z006 Encounter for examination for normal comparison and control in clinical research program: Secondary | ICD-10-CM | POA: Diagnosis not present

## 2016-02-12 DIAGNOSIS — E8809 Other disorders of plasma-protein metabolism, not elsewhere classified: Secondary | ICD-10-CM

## 2016-02-12 DIAGNOSIS — C227 Other specified carcinomas of liver: Secondary | ICD-10-CM | POA: Insufficient documentation

## 2016-02-12 DIAGNOSIS — C252 Malignant neoplasm of tail of pancreas: Secondary | ICD-10-CM

## 2016-02-12 DIAGNOSIS — Z5112 Encounter for antineoplastic immunotherapy: Secondary | ICD-10-CM

## 2016-02-12 DIAGNOSIS — C259 Malignant neoplasm of pancreas, unspecified: Secondary | ICD-10-CM

## 2016-02-12 LAB — CBC WITH DIFFERENTIAL/PLATELET
BASO%: 1.2 % (ref 0.0–2.0)
BASOS ABS: 0.1 10*3/uL (ref 0.0–0.1)
EOS ABS: 0.4 10*3/uL (ref 0.0–0.5)
EOS%: 6.2 % (ref 0.0–7.0)
HEMATOCRIT: 31.6 % — AB (ref 34.8–46.6)
HEMOGLOBIN: 9.9 g/dL — AB (ref 11.6–15.9)
LYMPH#: 1.5 10*3/uL (ref 0.9–3.3)
LYMPH%: 25.8 % (ref 14.0–49.7)
MCH: 31.8 pg (ref 25.1–34.0)
MCHC: 31.3 g/dL — ABNORMAL LOW (ref 31.5–36.0)
MCV: 101.6 fL — AB (ref 79.5–101.0)
MONO#: 0.6 10*3/uL (ref 0.1–0.9)
MONO%: 11.1 % (ref 0.0–14.0)
NEUT#: 3.1 10*3/uL (ref 1.5–6.5)
NEUT%: 55.7 % (ref 38.4–76.8)
Platelets: 105 10*3/uL — ABNORMAL LOW (ref 145–400)
RBC: 3.11 10*6/uL — ABNORMAL LOW (ref 3.70–5.45)
RDW: 19.4 % — AB (ref 11.2–14.5)
WBC: 5.6 10*3/uL (ref 3.9–10.3)

## 2016-02-12 LAB — COMPREHENSIVE METABOLIC PANEL
ALBUMIN: 2.7 g/dL — AB (ref 3.5–5.0)
ALK PHOS: 226 U/L — AB (ref 40–150)
ALT: 27 U/L (ref 0–55)
AST: 36 U/L — AB (ref 5–34)
Anion Gap: 5 mEq/L (ref 3–11)
BILIRUBIN TOTAL: 0.37 mg/dL (ref 0.20–1.20)
BUN: 8.6 mg/dL (ref 7.0–26.0)
CALCIUM: 9.1 mg/dL (ref 8.4–10.4)
CO2: 28 mEq/L (ref 22–29)
Chloride: 107 mEq/L (ref 98–109)
Creatinine: 0.8 mg/dL (ref 0.6–1.1)
EGFR: 82 mL/min/{1.73_m2} — AB (ref >90)
Glucose: 98 mg/dl (ref 70–140)
POTASSIUM: 4.6 meq/L (ref 3.5–5.1)
Sodium: 140 mEq/L (ref 136–145)
Total Protein: 5.9 g/dL — ABNORMAL LOW (ref 6.4–8.3)

## 2016-02-12 MED ORDER — DEXTROSE 5 % IV SOLN
Freq: Once | INTRAVENOUS | Status: AC
  Administered 2016-02-12: 11:00:00 via INTRAVENOUS

## 2016-02-12 MED ORDER — DEXTROSE 5 % IV SOLN
400.0000 mg/m2 | Freq: Once | INTRAVENOUS | Status: AC
  Administered 2016-02-12: 612 mg via INTRAVENOUS
  Filled 2016-02-12: qty 30.6

## 2016-02-12 MED ORDER — DEXTROSE 5 % IV SOLN
150.0000 mg/m2 | Freq: Once | INTRAVENOUS | Status: AC
  Administered 2016-02-12: 230 mg via INTRAVENOUS
  Filled 2016-02-12: qty 3.5

## 2016-02-12 MED ORDER — ATROPINE SULFATE 1 MG/ML IJ SOLN
0.5000 mg | Freq: Once | INTRAMUSCULAR | Status: AC | PRN
  Administered 2016-02-12: 0.5 mg via INTRAVENOUS

## 2016-02-12 MED ORDER — SODIUM CHLORIDE 0.9 % IV SOLN
Freq: Once | INTRAVENOUS | Status: AC
  Administered 2016-02-12: 10:00:00 via INTRAVENOUS
  Filled 2016-02-12: qty 5

## 2016-02-12 MED ORDER — SODIUM CHLORIDE 0.9 % IV SOLN
1920.0000 mg/m2 | INTRAVENOUS | Status: DC
  Administered 2016-02-12: 2950 mg via INTRAVENOUS
  Filled 2016-02-12: qty 59

## 2016-02-12 MED ORDER — OXALIPLATIN CHEMO INJECTION 100 MG/20ML
60.0000 mg/m2 | Freq: Once | INTRAVENOUS | Status: AC
  Administered 2016-02-12: 90 mg via INTRAVENOUS
  Filled 2016-02-12: qty 18

## 2016-02-12 MED ORDER — ATROPINE SULFATE 1 MG/ML IJ SOLN
INTRAMUSCULAR | Status: AC
  Filled 2016-02-12: qty 1

## 2016-02-12 MED ORDER — PALONOSETRON HCL INJECTION 0.25 MG/5ML
INTRAVENOUS | Status: AC
  Filled 2016-02-12: qty 5

## 2016-02-12 MED ORDER — PALONOSETRON HCL INJECTION 0.25 MG/5ML
0.2500 mg | Freq: Once | INTRAVENOUS | Status: AC
  Administered 2016-02-12: 0.25 mg via INTRAVENOUS

## 2016-02-12 NOTE — Telephone Encounter (Signed)
spoke w/ pt confirmed 6/28 apt.. pt will get new copy of sched on 6/16

## 2016-02-12 NOTE — Progress Notes (Addendum)
  [facility] OFFICE PROGRESS NOTE   Diagnosis: Pancreas cancer  INTERVAL HISTORY:   Wendy Palmer returns as scheduled. She reports an improved energy level and appetite over the past week. She was able to go out with friends last weekend. No nausea or neuropathy symptoms. She is able to perform her activities of independently. She has depression, but does not wish to change antidepressants or seek additional counseling.  Objective:  Vital signs in last 24 hours:  Blood pressure 125/50, pulse 94, temperature 98.6 F (37 C), temperature source Oral, resp. rate 18, height 5\' 2"  (1.575 m), weight 111 lb 6.4 oz (50.531 kg), SpO2 95 %.    HEENT: No thrush or ulcers Resp: Lungs clear bilaterally Cardio: Regular rate and rhythm GI: No hepatomegaly, nontender Vascular: No leg edema Neuro: Mild to moderate loss of vibratory sense at the fingertips bilaterally   Portacath/PICC-without erythema  Lab Results:  Lab Results  Component Value Date   WBC 5.6 02/12/2016   HGB 9.9* 02/12/2016   HCT 31.6* 02/12/2016   MCV 101.6* 02/12/2016   PLT 105* 02/12/2016   NEUTROABS 3.1 02/12/2016     Medications: I have reviewed the patient's current medications.  Assessment/Plan: 1. Pancreas cancer  MRI abdomen 08/27/2015 revealed a pancreas mass, adenopathy, and liver metastases  CTs 09/10/2015 with pancreas body/tail mass, liver metastases, lymphadenopathy, innumerable tiny pulmonary nodules  Ultrasound-guided biopsy of a right liver lesion 09/10/2015 confirmed adenocarcinoma  Enrollment on the SWOG S1313 study, randomized to the standard FOLFIRINOX arm  Cycle 1 FOLFIRINOX 09/25/2015  Cycle 2 FOLFIRINOX 10/09/2015  Cycle 3 FOLFIRINOX 10/23/2015  Cycle 4 FOLFIRINOX 11/05/2015  CT 11/19/2015- decrease in the size of liver lesions and the pancreas mass, ? Increased upper abdominal/retroperitoneal adenopath  Cycle 5 FOLFIRINOX 11/20/2015  Cycle 6 FOLFIRINOX  12/04/2015  Cycle 7 FOLFIRINOX 12/18/2015  Cycle 8 FOLFIRINOX 01/01/2016  Restaging CTs 01/14/2016-decrease in size of liver lesions and pancreas mass, stable to decreased upper abdominal lymphadenopathy, no evidence of disease progression  Cycle 9 FOLFIRINOX 01/15/2016  Cycle 10 FOLFIRINOX 02/12/2016 (sees chemotherapy dose reduced per protocol based on toxicity) sees 2. Pain secondary to #1, improved  3. History of constipation-likely secondary to narcotics  4. 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 Depression-grade 1, not interfering with ADLs   Disposition:  Wendy Palmer has an improved performance status following the break from chemotherapy. The plan is to proceed with chemotherapy today with a dose reduction per protocol. Her ECOG performance status is measured at 1 today.  She has grade to hypoalbuminemia secondary to metastatic pancreas cancer.  She will return for an office visit and chemotherapy in 2 weeks. She will contact us in the interim as needed.  Betsy Coder, MD  02/12/2016  9:03 AM

## 2016-02-12 NOTE — Progress Notes (Signed)
Nutrition follow-up completed with patient during infusion for pancreas cancer. Weight was documented as 111.4 pounds June 14 decreased from 117.7 pounds April 19 but improved from 108 pounds on May 25. Patient verbalizes no complaints or problems. States her appetite has improved with break from chemotherapy. Patient continues to consume ensure along with her meals and snacks.  Nutrition diagnosis: Food and nutrition related knowledge deficit resolved.  Recommended patient increase Ensure Plus 3 times a day between meals. Provided additional coupons for patient. Encouraged patient to focus on high-calorie, high-protein foods at meals. Questions were answered.  Teach back method used.  Patient will work to increase calories and protein to minimize weight loss.  No follow-up has been scheduled.  Patient knows to contact me for questions or concerns.  **Disclaimer: This note was dictated with voice recognition software. Similar sounding words can inadvertently be transcribed and this note may contain transcription errors which may not have been corrected upon publication of note.**

## 2016-02-12 NOTE — Progress Notes (Signed)
Pt reported to infusion RN that she is interested in adjusting antidepressant medication. Reviewed with MD and pharmacist. Order received to increase Tofranil to 150 mg daily, per Dr. Benay Spice. Discussed with pt, she is in agreement.

## 2016-02-12 NOTE — Progress Notes (Signed)
02/12/2016 0945 [id number] cycle 10 The patient returns to the clinic accompanied by her daughter, Wendy Palmer.  The patient states she feels so much better and that she "actually feels normal" today.  She reports having more appetite and has been eating much better.  She denies nausea and said she has not had nausea since 01/15/16. She denies any mouth sores or trouble swallowing today.  She denies bleeding, diarrhea, constipation, and pain.  Her weight is up 2 lbs from last visit.   She reports having had a good last couple of weeks and was able to get out and go places.  She still gets intermittent fatigue and it is relieved with rest.  She still has SOB with exertion.  She has very mild neuropathy symptoms that come and go and do not interfere with her function or ADLs. She says she still gets depressed but that she is "not like she was at the last visit".  She reports feeling mentally better beginning 02/01/16.  She denies feelings of harming herself.  She said she wants to get treatment today and that having the extra time to recover did a world of good for her mentally and physically.  She reiterated to MD that she wants to stay on the protocol treatment as long as she is able to. Dr. Benay Spice was in to see the patient and perform a physical exam.  Based on PE and lab results review, Dr. Benay Spice cleared the patient to resume treatment at a dose reduction level 1 per protocol section 8.4, D for previous grade 3 depression.   Dr. Benay Spice confirmed with this RN that the patients grade 2 hypoalbuminemia was disease related and not treatment related.  Dr. Benay Spice stated he would not hold treatment due to grade 2 hypoalbuminemia.  Okay to treat for grade 2 anemia was previously confirmed with [facility] Kathaleen [facility]. Dose reduction per section 8.4 to reduce to level 1 confirmed with second research RN, Tyrell Antonio.  Dose reductions confirmed with Dr. Benay Spice.  This RN confirmed the corrects doses based on last "dosing"  weight with Kennith Center, Pharm. D.  Signs for infusion and instructions given to Vladimir Creeks, RN.  She was without any questions. The patient understands to call for any problems or worsening side effects. Marcellus Scott, RN, BSN, MHA, OCN

## 2016-02-13 ENCOUNTER — Other Ambulatory Visit: Payer: Self-pay

## 2016-02-13 DIAGNOSIS — C252 Malignant neoplasm of tail of pancreas: Secondary | ICD-10-CM

## 2016-02-13 LAB — CANCER ANTIGEN 19-9: CA 19-9: 1 U/mL (ref 0–35)

## 2016-02-14 ENCOUNTER — Telehealth: Payer: Self-pay | Admitting: *Deleted

## 2016-02-14 ENCOUNTER — Ambulatory Visit: Payer: Medicare Other

## 2016-02-14 ENCOUNTER — Encounter: Payer: Self-pay | Admitting: Nurse Practitioner

## 2016-02-14 ENCOUNTER — Ambulatory Visit (HOSPITAL_BASED_OUTPATIENT_CLINIC_OR_DEPARTMENT_OTHER): Payer: Medicare Other

## 2016-02-14 ENCOUNTER — Ambulatory Visit (HOSPITAL_BASED_OUTPATIENT_CLINIC_OR_DEPARTMENT_OTHER): Payer: Medicare Other | Admitting: Nurse Practitioner

## 2016-02-14 VITALS — BP 126/53 | HR 80 | Temp 98.4°F | Resp 18

## 2016-02-14 DIAGNOSIS — Z006 Encounter for examination for normal comparison and control in clinical research program: Secondary | ICD-10-CM

## 2016-02-14 DIAGNOSIS — C252 Malignant neoplasm of tail of pancreas: Secondary | ICD-10-CM

## 2016-02-14 DIAGNOSIS — R232 Flushing: Secondary | ICD-10-CM | POA: Diagnosis not present

## 2016-02-14 DIAGNOSIS — R51 Headache: Secondary | ICD-10-CM | POA: Diagnosis not present

## 2016-02-14 MED ORDER — PEGFILGRASTIM INJECTION 6 MG/0.6ML ~~LOC~~
6.0000 mg | PREFILLED_SYRINGE | Freq: Once | SUBCUTANEOUS | Status: AC
  Administered 2016-02-14: 6 mg via SUBCUTANEOUS
  Filled 2016-02-14: qty 0.6

## 2016-02-14 MED ORDER — HEPARIN SOD (PORK) LOCK FLUSH 100 UNIT/ML IV SOLN
500.0000 [IU] | Freq: Once | INTRAVENOUS | Status: AC | PRN
  Administered 2016-02-14: 500 [IU]
  Filled 2016-02-14: qty 5

## 2016-02-14 MED ORDER — SODIUM CHLORIDE 0.9 % IJ SOLN
10.0000 mL | INTRAMUSCULAR | Status: DC | PRN
  Administered 2016-02-14: 10 mL
  Filled 2016-02-14: qty 10

## 2016-02-14 NOTE — Telephone Encounter (Signed)
Oncology Nurse Navigator Documentation  Oncology Nurse Navigator Flowsheets 02/14/2016  Navigator Location [facility]  Navigator Encounter Type Telephone  Telephone Outgoing Call;Appt Confirmation/Clarification;Diagnostic Results  Abnormal Finding Date -  Confirmed Diagnosis Date -  Patient Visit Type -  Treatment Phase -  Barriers/Navigation Needs -  Education -Will take 10 business days for [employer] results from when received by the company.  Interventions -  Coordination of Care -Made her aware of the times for next two chemo appointments.  Education Method -  Support Groups/Services -  Acuity -  Time Spent with Patient 15

## 2016-02-14 NOTE — Assessment & Plan Note (Addendum)
Patient states that she received cycle 10 of her  Folfirinox chemotherapy on Wednesday, 02/12/2016.  She went home with her chemotherapy pump.  She states she developed a mild headache last night; and awoke this morning with increased facial flushing.  Patient states that she has a history of rosacea anyway; but the redness in her face is much more extreme than she is used to.  She denies any other new symptoms whatsoever.    Exam reveals erythema across the cheeks and nose only.  Patient has no actual rash to her face or other areas of her body.  Patient denies any pruritus to the area.  Reviewed all findings with Dr. Benay Spice; he feels the patient managed to be experiencing some increasing flushing from her dexamethasone that she received as premedication prior to her chemotherapy.  Patient was given instructions regarding taking both Benadryl and Pepcid as directed on an as-needed basis.  Also, patient was advised to come to the emergency department for any worsening symptoms whatsoever.

## 2016-02-14 NOTE — Progress Notes (Signed)
SYMPTOM MANAGEMENT CLINIC    Chief Complaint: Facial flushing  HPI:  Wendy Palmer 65 y.o. female diagnosed with pancreatic cancer.  Currently undergoing Folfirinox chemotherapy regimen.  Patient presents to the [facility] today to have her chemotherapy pump discontinued and to receive her Neulasta injection.  She reports increased facial flushing since last night.  She states she also had a mild headache that is since resolved.  Patient states that she has a history of rosacea as well.  She denies any other new symptoms whatsoever.  She denies any recent fevers or chills.    No history exists.    Review of Systems  Skin:       Facial flushing  Neurological: Positive for headaches.  All other systems reviewed and are negative.   Past Medical History  Diagnosis Date  . Diverticulosis   . Depression   . Patella fracture   . Hypertension     Past Surgical History  Procedure Laterality Date  . Tubal ligation    . Refractive surgery      has Rosacea; Essential hypertension; Hyperlipemia; Hot flashes, menopausal; Cancer of pancreas, tail ([place]); Liver mass; and Facial flushing on her problem list.    is allergic to macrobid and ace inhibitors.    Medication List       This list is accurate as of: 02/14/16 10:44 PM.  Always use your most recent med list.               acetaminophen-codeine 300-30 MG tablet  Commonly known as:  TYLENOL #3  Reported on 01/23/2016     amLODipine 2.5 MG tablet  Commonly known as:  NORVASC  TAKE 1 TABLET (2.5 MG TOTAL) BY MOUTH DAILY.     aspirin 81 MG tablet  Take 81 mg by mouth daily. Reported on 01/15/2016     calcium carbonate 500 MG chewable tablet  Commonly known as:  TUMS - dosed in mg elemental calcium  Chew 1 tablet by mouth daily as needed for indigestion or heartburn.     docusate sodium 100 MG capsule  Commonly known as:  COLACE  Take 100 mg by mouth daily as needed.     famotidine 20 MG tablet  Commonly known  as:  PEPCID  One at bedtime     HYDROcodone-acetaminophen 5-325 MG tablet  Commonly known as:  NORCO/VICODIN  Take 1 tablet by mouth every 6 (six) hours as needed for moderate pain. Take 1 tablet 4 times daily as needed     HYDROmorphone 4 MG tablet  Commonly known as:  DILAUDID  Take 1-2 tablets (4-8 mg total) by mouth every 4 (four) hours as needed for severe pain.     imipramine 50 MG tablet  Commonly known as:  TOFRANIL  Take 2 tablets (100 mg total) by mouth at bedtime.     lidocaine-prilocaine cream  Commonly known as:  EMLA  Apply to port site one hour prior to use. Do not rub in. Cover with plastic.     LORazepam 0.5 MG tablet  Commonly known as:  ATIVAN  Take 1 tablet (0.5 mg total) by mouth 3 (three) times daily as needed for anxiety.     magic mouthwash Soln  Take 5-10 mLs by mouth 4 (four) times daily as needed for mouth pain.     magnesium citrate solution  Take 148 mLs by mouth as directed. Try 1/2 bottle at bedtime and repeat next day if no BM  ondansetron 8 MG tablet  Commonly known as:  ZOFRAN  Take 1 tablet (8 mg total) by mouth every 8 (eight) hours as needed for nausea or vomiting.     polyethylene glycol packet  Commonly known as:  MIRALAX / GLYCOLAX  Take 17 g by mouth daily.     prochlorperazine 10 MG tablet  Commonly known as:  COMPAZINE  TAKE 1 TABLET (10 MG TOTAL) BY MOUTH EVERY 6 (SIX) HOURS AS NEEDED FOR NAUSEA OR VOMITING.         PHYSICAL EXAMINATION  Oncology Vitals 02/14/2016 02/12/2016  Height - 158 cm  Weight - 50.531 kg  Weight (lbs) - 111 lbs 6 oz  BMI (kg/m2) - 20.38 kg/m2  Temp 98.4 98.6  Pulse 80 94  Resp 18 18  SpO2 100 95  BSA (m2) - 1.49 m2   BP Readings from Last 2 Encounters:  02/14/16 126/53  02/12/16 125/50    Physical Exam  Constitutional: She is oriented to person, place, and time and well-developed, well-nourished, and in no distress.  HENT:  Head: Normocephalic and atraumatic.  Eyes: Conjunctivae and  EOM are normal. Pupils are equal, round, and reactive to light. Right eye exhibits no discharge. Left eye exhibits no discharge. No scleral icterus.  Neck: Normal range of motion.  Pulmonary/Chest: Effort normal. No respiratory distress.  Musculoskeletal: Normal range of motion. She exhibits no edema or tenderness.  Neurological: She is alert and oriented to person, place, and time. Gait normal.  Skin: Skin is warm and dry. No rash noted. No erythema. No pallor.  Exam reveals erythema across the cheeks and nose only.  Patient has no actual rash to her face or other areas of her body.  Patient denies any pruritus to the area.    Psychiatric: Affect normal.  Nursing note and vitals reviewed.   LABORATORY DATA:. Clinical Support on 02/12/2016  Component Date Value Ref Range Status  . WBC 02/12/2016 5.6  3.9 - 10.3 10e3/uL Final  . NEUT# 02/12/2016 3.1  1.5 - 6.5 10e3/uL Final  . HGB 02/12/2016 9.9* 11.6 - 15.9 g/dL Final  . HCT 02/12/2016 31.6* 34.8 - 46.6 % Final  . Platelets 02/12/2016 105* 145 - 400 10e3/uL Final  . MCV 02/12/2016 101.6* 79.5 - 101.0 fL Final  . MCH 02/12/2016 31.8  25.1 - 34.0 pg Final  . MCHC 02/12/2016 31.3* 31.5 - 36.0 g/dL Final  . RBC 02/12/2016 3.11* 3.70 - 5.45 10e6/uL Final  . RDW 02/12/2016 19.4* 11.2 - 14.5 % Final  . lymph# 02/12/2016 1.5  0.9 - 3.3 10e3/uL Final  . MONO# 02/12/2016 0.6  0.1 - 0.9 10e3/uL Final  . Eosinophils Absolute 02/12/2016 0.4  0.0 - 0.5 10e3/uL Final  . Basophils Absolute 02/12/2016 0.1  0.0 - 0.1 10e3/uL Final  . NEUT% 02/12/2016 55.7  38.4 - 76.8 % Final  . LYMPH% 02/12/2016 25.8  14.0 - 49.7 % Final  . MONO% 02/12/2016 11.1  0.0 - 14.0 % Final  . EOS% 02/12/2016 6.2  0.0 - 7.0 % Final  . BASO% 02/12/2016 1.2  0.0 - 2.0 % Final  . Sodium 02/12/2016 140  136 - 145 mEq/L Final  . Potassium 02/12/2016 4.6  3.5 - 5.1 mEq/L Final  . Chloride 02/12/2016 107  98 - 109 mEq/L Final  . CO2 02/12/2016 28  22 - 29 mEq/L Final  .  Glucose 02/12/2016 98  70 - 140 mg/dl Final   Glucose reference range is for nonfasting patients. Fasting  glucose reference range is 70- 100.  Marland Kitchen BUN 02/12/2016 8.6  7.0 - 26.0 mg/dL Final  . Creatinine 02/12/2016 0.8  0.6 - 1.1 mg/dL Final  . Total Bilirubin 02/12/2016 0.37  0.20 - 1.20 mg/dL Final  . Alkaline Phosphatase 02/12/2016 226* 40 - 150 U/L Final  . AST 02/12/2016 36* 5 - 34 U/L Final  . ALT 02/12/2016 27  0 - 55 U/L Final  . Total Protein 02/12/2016 5.9* 6.4 - 8.3 g/dL Final  . Albumin 02/12/2016 2.7* 3.5 - 5.0 g/dL Final  . Calcium 02/12/2016 9.1  8.4 - 10.4 mg/dL Final  . Anion Gap 02/12/2016 5  3 - 11 mEq/L Final  . EGFR 02/12/2016 82* >90 ml/min/1.73 m2 Final   eGFR is calculated using the CKD-EPI Creatinine Equation (2009)  . CA 19-9 02/12/2016 1  0 - 35 U/mL Final   Roche ECLIA methodology    RADIOGRAPHIC STUDIES: No results found.  ASSESSMENT/PLAN:    Facial flushing Patient states that she received cycle 10 of her  Folfirinox chemotherapy on Wednesday, 02/12/2016.  She went home with her chemotherapy pump.  She states she developed a mild headache last night; and awoke this morning with increased facial flushing.  Patient states that she has a history of rosacea anyway; but the redness in her face is much more extreme than she is used to.  She denies any other new symptoms whatsoever.    Exam reveals erythema across the cheeks and nose only.  Patient has no actual rash to her face or other areas of her body.  Patient denies any pruritus to the area.  Reviewed all findings with Dr. Benay Spice; he feels the patient managed to be experiencing some increasing flushing from her dexamethasone that she received as premedication prior to her chemotherapy.  Patient was given instructions regarding taking both Benadryl and Pepcid as directed on an as-needed basis.  Also, patient was advised to come to the emergency department for any worsening symptoms whatsoever.     Cancer  of pancreas, tail [facility]) Patient received cycle 10 of her Folfirinox chemotherapy on Wednesday, 02/12/2016.  She developed some increased facial flushing last night.  See further notes for details.  Patient is scheduled to return on 02/26/2016 for labs, flush, visit, and chemotherapy.   Patient stated understanding of all instructions; and was in agreement with this plan of care. The patient knows to call the clinic with any problems, questions or concerns.   Total time spent with patient was 15 minutes;  with greater than 75 percent of that time spent in face to face counseling regarding patient's symptoms,  and coordination of care and follow up.  Disclaimer:This dictation was prepared with Dragon/digital dictation along with [employer]. Any transcriptional errors that result from this process are unintentional.  Drue Second, NP 02/14/2016

## 2016-02-14 NOTE — Assessment & Plan Note (Signed)
Patient received cycle 10 of her Folfirinox chemotherapy on Wednesday, 02/12/2016.  She developed some increased facial flushing last night.  See further notes for details.  Patient is scheduled to return on 02/26/2016 for labs, flush, visit, and chemotherapy.

## 2016-02-14 NOTE — Patient Instructions (Signed)

## 2016-02-16 ENCOUNTER — Encounter: Payer: Self-pay | Admitting: Oncology

## 2016-02-18 ENCOUNTER — Telehealth: Payer: Self-pay | Admitting: *Deleted

## 2016-02-18 DIAGNOSIS — C252 Malignant neoplasm of tail of pancreas: Secondary | ICD-10-CM

## 2016-02-18 MED ORDER — LORAZEPAM 0.5 MG PO TABS: 0.5000 mg | ORAL_TABLET | Freq: Three times a day (TID) | ORAL | Status: DC | PRN

## 2016-02-18 MED ORDER — FLUCONAZOLE 150 MG PO TABS: 150.0000 mg | ORAL_TABLET | Freq: Every day | ORAL | Status: DC

## 2016-02-18 NOTE — Telephone Encounter (Signed)
02/18/2016 0845 Received voice message from patient requesting a refill on medication and questioning if she might have a UTI.  Message forwarded to Dr. Gearldine Shown nurse, Lavella Lemons, and also spoke with Lavella Lemons who acknowledged she will follow-up with Dr. Benay Spice before contacting patient.

## 2016-02-18 NOTE — Telephone Encounter (Signed)
Pt returned call, thinks she has a UTI. She is having clear-yellowish vaginal discharge began about 2 weeks ago. Denies dysuria or urinary urgency. Reviewed with Dr. Benay Spice: Order received for Diflucan. Called pt with instructions. Pt requested refill on Lorazepam to be called to pharmacy. She has not yet increased her Tofranil dose, plans to do so today.

## 2016-02-18 NOTE — Telephone Encounter (Signed)
Attempted to return pt's call, no answer, voicemail full.

## 2016-02-23 ENCOUNTER — Other Ambulatory Visit: Payer: Self-pay | Admitting: Oncology

## 2016-02-25 ENCOUNTER — Encounter (HOSPITAL_COMMUNITY): Payer: Self-pay

## 2016-02-26 ENCOUNTER — Ambulatory Visit: Payer: BLUE CROSS/BLUE SHIELD

## 2016-02-26 ENCOUNTER — Other Ambulatory Visit (HOSPITAL_BASED_OUTPATIENT_CLINIC_OR_DEPARTMENT_OTHER): Payer: Medicare Other

## 2016-02-26 ENCOUNTER — Ambulatory Visit (HOSPITAL_BASED_OUTPATIENT_CLINIC_OR_DEPARTMENT_OTHER): Payer: Medicare Other

## 2016-02-26 ENCOUNTER — Telehealth: Payer: Self-pay | Admitting: Nurse Practitioner

## 2016-02-26 ENCOUNTER — Other Ambulatory Visit: Payer: Self-pay | Admitting: *Deleted

## 2016-02-26 ENCOUNTER — Encounter: Payer: Self-pay | Admitting: *Deleted

## 2016-02-26 ENCOUNTER — Ambulatory Visit (HOSPITAL_BASED_OUTPATIENT_CLINIC_OR_DEPARTMENT_OTHER): Payer: Medicare Other | Admitting: Oncology

## 2016-02-26 VITALS — BP 116/55 | HR 87 | Temp 98.1°F | Resp 18 | Ht 62.0 in | Wt 108.6 lb

## 2016-02-26 DIAGNOSIS — R634 Abnormal weight loss: Secondary | ICD-10-CM | POA: Diagnosis not present

## 2016-02-26 DIAGNOSIS — C252 Malignant neoplasm of tail of pancreas: Secondary | ICD-10-CM

## 2016-02-26 DIAGNOSIS — Z5111 Encounter for antineoplastic chemotherapy: Secondary | ICD-10-CM

## 2016-02-26 DIAGNOSIS — C787 Secondary malignant neoplasm of liver and intrahepatic bile duct: Secondary | ICD-10-CM

## 2016-02-26 DIAGNOSIS — R630 Anorexia: Secondary | ICD-10-CM

## 2016-02-26 DIAGNOSIS — Z006 Encounter for examination for normal comparison and control in clinical research program: Secondary | ICD-10-CM

## 2016-02-26 DIAGNOSIS — Z95828 Presence of other vascular implants and grafts: Secondary | ICD-10-CM

## 2016-02-26 LAB — COMPREHENSIVE METABOLIC PANEL
ALBUMIN: 2.9 g/dL — AB (ref 3.5–5.0)
ALK PHOS: 244 U/L — AB (ref 40–150)
ALT: 32 U/L (ref 0–55)
AST: 34 U/L (ref 5–34)
Anion Gap: 9 mEq/L (ref 3–11)
BUN: 6 mg/dL — AB (ref 7.0–26.0)
CHLORIDE: 103 meq/L (ref 98–109)
CO2: 25 mEq/L (ref 22–29)
Calcium: 8.9 mg/dL (ref 8.4–10.4)
Creatinine: 0.7 mg/dL (ref 0.6–1.1)
GLUCOSE: 116 mg/dL (ref 70–140)
POTASSIUM: 3.8 meq/L (ref 3.5–5.1)
SODIUM: 138 meq/L (ref 136–145)
Total Bilirubin: <0.3 mg/dL (ref 0.20–1.20)
Total Protein: 6.1 g/dL — ABNORMAL LOW (ref 6.4–8.3)

## 2016-02-26 LAB — CBC WITH DIFFERENTIAL/PLATELET
BASO%: 0.3 % (ref 0.0–2.0)
BASOS ABS: 0 10*3/uL (ref 0.0–0.1)
EOS%: 6.9 % (ref 0.0–7.0)
Eosinophils Absolute: 0.4 10*3/uL (ref 0.0–0.5)
HCT: 29.7 % — ABNORMAL LOW (ref 34.8–46.6)
HGB: 9.1 g/dL — ABNORMAL LOW (ref 11.6–15.9)
LYMPH%: 26.2 % (ref 14.0–49.7)
MCH: 31 pg (ref 25.1–34.0)
MCHC: 30.6 g/dL — AB (ref 31.5–36.0)
MCV: 101 fL (ref 79.5–101.0)
MONO#: 0.6 10*3/uL (ref 0.1–0.9)
MONO%: 9.8 % (ref 0.0–14.0)
NEUT#: 3.7 10*3/uL (ref 1.5–6.5)
NEUT%: 56.8 % (ref 38.4–76.8)
Platelets: 84 10*3/uL — ABNORMAL LOW (ref 145–400)
RBC: 2.94 10*6/uL — ABNORMAL LOW (ref 3.70–5.45)
RDW: 17.4 % — AB (ref 11.2–14.5)
WBC: 6.4 10*3/uL (ref 3.9–10.3)
lymph#: 1.7 10*3/uL (ref 0.9–3.3)

## 2016-02-26 MED ORDER — PALONOSETRON HCL INJECTION 0.25 MG/5ML
0.2500 mg | Freq: Once | INTRAVENOUS | Status: AC
  Administered 2016-02-26: 0.25 mg via INTRAVENOUS

## 2016-02-26 MED ORDER — DEXTROSE 5 % IV SOLN
Freq: Once | INTRAVENOUS | Status: AC
  Administered 2016-02-26: 11:00:00 via INTRAVENOUS

## 2016-02-26 MED ORDER — ATROPINE SULFATE 1 MG/ML IJ SOLN
INTRAMUSCULAR | Status: AC
  Filled 2016-02-26: qty 1

## 2016-02-26 MED ORDER — ATROPINE SULFATE 1 MG/ML IJ SOLN
0.5000 mg | Freq: Once | INTRAMUSCULAR | Status: AC | PRN
  Administered 2016-02-26: 0.5 mg via INTRAVENOUS

## 2016-02-26 MED ORDER — SODIUM CHLORIDE 0.9 % IJ SOLN
10.0000 mL | INTRAMUSCULAR | Status: DC | PRN
  Administered 2016-02-26: 10 mL via INTRAVENOUS
  Filled 2016-02-26: qty 10

## 2016-02-26 MED ORDER — IRINOTECAN HCL CHEMO INJECTION 100 MG/5ML
150.0000 mg/m2 | Freq: Once | INTRAVENOUS | Status: AC
  Administered 2016-02-26: 230 mg via INTRAVENOUS
  Filled 2016-02-26: qty 5

## 2016-02-26 MED ORDER — OXALIPLATIN CHEMO INJECTION 100 MG/20ML
60.0000 mg/m2 | Freq: Once | INTRAVENOUS | Status: AC
  Administered 2016-02-26: 90 mg via INTRAVENOUS
  Filled 2016-02-26: qty 18

## 2016-02-26 MED ORDER — FLUOROURACIL CHEMO INJECTION 5 GM/100ML
1920.0000 mg/m2 | INTRAVENOUS | Status: DC
  Administered 2016-02-26: 2950 mg via INTRAVENOUS
  Filled 2016-02-26: qty 59

## 2016-02-26 MED ORDER — IMIPRAMINE HCL 50 MG PO TABS: 150.0000 mg | ORAL_TABLET | Freq: Every day | ORAL | Status: AC

## 2016-02-26 MED ORDER — FOSAPREPITANT DIMEGLUMINE INJECTION 150 MG
Freq: Once | INTRAVENOUS | Status: AC
  Administered 2016-02-26: 10:00:00 via INTRAVENOUS
  Filled 2016-02-26: qty 5

## 2016-02-26 MED ORDER — HYDROMORPHONE HCL 4 MG PO TABS: 4.0000 mg | ORAL_TABLET | ORAL | Status: DC | PRN

## 2016-02-26 MED ORDER — DEXTROSE 5 % IV SOLN
400.0000 mg/m2 | Freq: Once | INTRAVENOUS | Status: AC
  Administered 2016-02-26: 612 mg via INTRAVENOUS
  Filled 2016-02-26: qty 30.6

## 2016-02-26 MED ORDER — LIDOCAINE-PRILOCAINE 2.5-2.5 % EX CREA
TOPICAL_CREAM | CUTANEOUS | Status: DC
Start: 2016-02-26 — End: 2016-06-06

## 2016-02-26 MED ORDER — PALONOSETRON HCL INJECTION 0.25 MG/5ML
INTRAVENOUS | Status: AC
  Filled 2016-02-26: qty 5

## 2016-02-26 NOTE — Patient Instructions (Signed)

## 2016-02-26 NOTE — Telephone Encounter (Signed)
per of to sch to sch pt appt-appts already sch-gave pt contrast-adv central sch will call to sch trmt

## 2016-02-26 NOTE — Progress Notes (Signed)
02/26/2016 1000  SWOG [id number] cycle 11, day 1 The patient arrived to the clinic accompanied by __.    Required labs were drawn.  The patient reports

## 2016-02-26 NOTE — Progress Notes (Signed)
02/26/2016 1032 SWOG S1313 cycle 11, day 1 The patient arrived to the clinic accompanied by her daughter, Bonnita Nasuti.Required labs were drawn.The patient reportsthat she has had a very good last 4 days.  She reports her energy has been better over last few days and that she has been able to get out and go places.  She reports continued intermittent fatigue with mild dyspnea only on exertion.  Today she has a performance status of 1.  She states she still has loss of appetite, but that her eating habits are more normal over the past 4 days.  She stated she went out to eat several times and really enjoyed it.  She said the first week after her treatment was rough and that her appetite was poor on 02/15/16 through 02/19/16 stating she had intermittent nausea during that time.  She denies nausea and  mouth sores today and reported that it was only mild this past treatment. She had mild facial flushing, no rash,  from 02/14/16 through 02/16/16.  She has had vaginal discharge and took Diflucan for it. She denies dysuria or foul odor with urination. She reports improvement in vaginal discharge beginning 02/24/16, stating it is much less at this time.  She denies bleeding, bruising, fever, or diarrhea. Dr. Benay Spice was in to perform a physical exam.  Per Dr. Benay Spice, the vaginal discharge was "not an infection" and Diflucan was given more for prevention.  MD states patient has a grade 1 vaginal discharge according to her description.  Per Dr. Benay Spice, the patient's anorexia is back to grade 1 today.  Her weight loss since baseline is a grade 2 and Dr. Benay Spice confirmed he would not hold treatment due to the weight loss as the cause is multifactorial and not due to treatment alone. Dr. Benay Spice reviewed today's lab results and cleared the patient for protocol treatment, to remain at same dosing as last treatment as the dosing weight is within acceptable range. The patient reported she has not had any falls since the one  time she fell on 01/28/16.  The patient and her daughter were educated on fall precautions and reminded of things they can do at patient's home to prevent potential falls. The patient and her daughter were reminded to report any signs of bleeding and bruising.  The patient was reminded to stay hydrated and to follow the dietician's directions for supplements and food choices.  The patient and her daughter verbalized understanding and were without any questions. Drug doses for today's treatment were confirmed with M. Cyndia Bent, Pharm. D.  Sign for infusion with instructions were reviewed with Rogene Houston, RN.  She had no questions. The patient is aware she will have a CT scan scheduled on 03/10/16 prior to next scheduled treatment on 03/11/16. Marcellus Scott, RN, BSN, MHA, OCN

## 2016-02-26 NOTE — Progress Notes (Addendum)
  [facility] OFFICE PROGRESS NOTE   Diagnosis: Pancreas cancer  INTERVAL HISTORY:   Ms. Wotton turns as scheduled. She completed another cycle of chemotherapy beginning 02/12/2016. She had facial flushing a few days after receiving the chemotherapy. No other rash. No mouth sores, nausea, or diarrhea. She continues to have anorexia. The last 4-5 days she has felt well. She denies neuropathy symptoms. She has pain in the afternoon and evenings, relieved with Dilaudid. She was treated with a single dose of Diflucan for a vaginal discharge. The discharge has improved. She denies dysuria.  Objective:  Vital signs in last 24 hours:  Blood pressure 116/55, pulse 87, temperature 98.1 F (36.7 C), temperature source Oral, resp. rate 18, height 5\' 2"  (1.575 m), weight 108 lb 9.6 oz (49.261 kg), SpO2 99 %.    HEENT: No thrush or ulcers Resp: Lungs clear bilaterally Cardio: Regular rate and rhythm GI: No hepatosplenomegaly, no mass, nontender Vascular: No leg edema Neuro: Mild decrease in vibratory sense at the fingertips bilaterally  Skin: Face without rash   Portacath/PICC-without erythema  Lab Results:  Lab Results  Component Value Date   WBC 6.4 02/26/2016   HGB 9.1* 02/26/2016   HCT 29.7* 02/26/2016   MCV 101.0 02/26/2016   PLT 84* 02/26/2016   NEUTROABS 3.7 02/26/2016     Medications: I have reviewed the patient's current medications.  Assessment/Plan: 1. Pancreas cancer   Foundation 1 testing on peripheral blood 02/12/2016-no alteration detected  MRI abdomen 08/27/2015 revealed a pancreas mass, adenopathy, and liver metastases  CTs 09/10/2015 with pancreas body/tail mass, liver metastases, lymphadenopathy, innumerable tiny pulmonary nodules  Ultrasound-guided biopsy of a right liver lesion 09/10/2015 confirmed adenocarcinoma  Enrollment on the SWOG S1313 study, randomized to the standard FOLFIRINOX arm  Cycle 1 FOLFIRINOX 09/25/2015  Cycle 2  FOLFIRINOX 10/09/2015  Cycle 3 FOLFIRINOX 10/23/2015  Cycle 4 FOLFIRINOX 11/05/2015  CT 11/19/2015- decrease in the size of liver lesions and the pancreas mass, ? Increased upper abdominal/retroperitoneal adenopath  Cycle 5 FOLFIRINOX 11/20/2015  Cycle 6 FOLFIRINOX 12/04/2015  Cycle 7 FOLFIRINOX 12/18/2015  Cycle 8 FOLFIRINOX 01/01/2016  Restaging CTs 01/14/2016-decrease in size of liver lesions and pancreas mass, stable to decreased upper abdominal lymphadenopathy, no evidence of disease progression  Cycle 9 FOLFIRINOX 01/15/2016  Cycle 10 FOLFIRINOX 02/12/2016 ( chemotherapy dose reduced per protocol based on toxicity)  Cycle 11 FOLFIRINOX 02/26/2016  2. Pain secondary to #1, improved  3. History of constipation-likely secondary to narcotics  4. 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 Depression-grade 1, not interfering with ADLs    Disposition:  She appears stable. She tolerated the most recent cycle of chemotherapy well. The plan is to proceed with another cycle of FOLFIRINOX today. She will return for an office visit after a restaging CT evaluation in 2 weeks.  Her ECOG performance status is measured at 1 today. Weight loss is multifactorial including a component due to pancreas cancer, depression, and chemotherapy. Anorexia is at grade 1 today.  Betsy Coder, MD  02/26/2016  9:02 AM

## 2016-02-26 NOTE — Patient Instructions (Signed)
[facility] Discharge Instructions for Patients Receiving Chemotherapy  Today you received the following chemotherapy agents oxaliplatin/irinotecan/leucovorin/florouracil  To help prevent nausea and vomiting after your treatment, we encourage you to take your nausea medication as directed   If you develop nausea and vomiting that is not controlled by your nausea medication, call the clinic.   BELOW ARE SYMPTOMS THAT SHOULD BE REPORTED IMMEDIATELY:  *FEVER GREATER THAN 100.5 F  *CHILLS WITH OR WITHOUT FEVER  NAUSEA AND VOMITING THAT IS NOT CONTROLLED WITH YOUR NAUSEA MEDICATION  *UNUSUAL SHORTNESS OF BREATH  *UNUSUAL BRUISING OR BLEEDING  TENDERNESS IN MOUTH AND THROAT WITH OR WITHOUT PRESENCE OF ULCERS  *URINARY PROBLEMS  *BOWEL PROBLEMS  UNUSUAL RASH Items with * indicate a potential emergency and should be followed up as soon as possible.  Feel free to call the clinic you have any questions or concerns. The clinic phone number is [phone].

## 2016-02-26 NOTE — Progress Notes (Signed)
OK to treat with platelet count of 84 per Dr. Benay Spice

## 2016-02-27 LAB — CANCER ANTIGEN 19-9: CA 19-9: 1 U/mL (ref 0–35)

## 2016-02-28 ENCOUNTER — Ambulatory Visit: Payer: Medicare Other

## 2016-02-28 ENCOUNTER — Ambulatory Visit (HOSPITAL_BASED_OUTPATIENT_CLINIC_OR_DEPARTMENT_OTHER): Payer: PPO

## 2016-02-28 VITALS — BP 102/63 | HR 94 | Temp 98.0°F | Resp 18

## 2016-02-28 DIAGNOSIS — Z006 Encounter for examination for normal comparison and control in clinical research program: Secondary | ICD-10-CM

## 2016-02-28 DIAGNOSIS — C252 Malignant neoplasm of tail of pancreas: Secondary | ICD-10-CM

## 2016-02-28 MED ORDER — SODIUM CHLORIDE 0.9 % IJ SOLN
10.0000 mL | INTRAMUSCULAR | Status: DC | PRN
  Administered 2016-02-28: 10 mL
  Filled 2016-02-28: qty 10

## 2016-02-28 MED ORDER — PEGFILGRASTIM INJECTION 6 MG/0.6ML ~~LOC~~
6.0000 mg | PREFILLED_SYRINGE | Freq: Once | SUBCUTANEOUS | Status: AC
  Administered 2016-02-28: 6 mg via SUBCUTANEOUS
  Filled 2016-02-28: qty 0.6

## 2016-02-28 MED ORDER — HEPARIN SOD (PORK) LOCK FLUSH 100 UNIT/ML IV SOLN
500.0000 [IU] | Freq: Once | INTRAVENOUS | Status: AC | PRN
  Administered 2016-02-28: 500 [IU]
  Filled 2016-02-28: qty 5

## 2016-02-28 NOTE — Patient Instructions (Signed)
Pegfilgrastim injection What is this medicine? PEGFILGRASTIM (PEG fil gra stim) is a long-acting granulocyte colony-stimulating factor that stimulates the growth of neutrophils, a type of white blood cell important in the body's fight against infection. It is used to reduce the incidence of fever and infection in patients with certain types of cancer who are receiving chemotherapy that affects the bone marrow, and to increase survival after being exposed to high doses of radiation. This medicine may be used for other purposes; ask your health care provider or pharmacist if you have questions. What should I tell my health care provider before I take this medicine? They need to know if you have any of these conditions: -kidney disease -latex allergy -ongoing radiation therapy -sickle cell disease -skin reactions to acrylic adhesives (On-Body Injector only) -an unusual or allergic reaction to pegfilgrastim, filgrastim, other medicines, foods, dyes, or preservatives -pregnant or trying to get pregnant -breast-feeding How should I use this medicine? This medicine is for injection under the skin. If you get this medicine at home, you will be taught how to prepare and give the pre-filled syringe or how to use the On-body Injector. Refer to the patient Instructions for Use for detailed instructions. Use exactly as directed. Take your medicine at regular intervals. Do not take your medicine more often than directed. It is important that you put your used needles and syringes in a special sharps container. Do not put them in a trash can. If you do not have a sharps container, call your pharmacist or healthcare provider to get one. Talk to your pediatrician regarding the use of this medicine in children. While this drug may be prescribed for selected conditions, precautions do apply. Overdosage: If you think you have taken too much of this medicine contact a poison control center or emergency room at  once. NOTE: This medicine is only for you. Do not share this medicine with others. What if I miss a dose? It is important not to miss your dose. Call your doctor or health care professional if you miss your dose. If you miss a dose due to an On-body Injector failure or leakage, a new dose should be administered as soon as possible using a single prefilled syringe for manual use. What may interact with this medicine? Interactions have not been studied. Give your health care provider a list of all the medicines, herbs, non-prescription drugs, or dietary supplements you use. Also tell them if you smoke, drink alcohol, or use illegal drugs. Some items may interact with your medicine. This list may not describe all possible interactions. Give your health care provider a list of all the medicines, herbs, non-prescription drugs, or dietary supplements you use. Also tell them if you smoke, drink alcohol, or use illegal drugs. Some items may interact with your medicine. What should I watch for while using this medicine? You may need blood work done while you are taking this medicine. If you are going to need a MRI, CT scan, or other procedure, tell your doctor that you are using this medicine (On-Body Injector only). What side effects may I notice from receiving this medicine? Side effects that you should report to your doctor or health care professional as soon as possible: -allergic reactions like skin rash, itching or hives, swelling of the face, lips, or tongue -dizziness -fever -pain, redness, or irritation at site where injected -pinpoint red spots on the skin -red or dark-brown urine -shortness of breath or breathing problems -stomach or side pain, or pain   at the shoulder -swelling -tiredness -trouble passing urine or change in the amount of urine Side effects that usually do not require medical attention (report to your doctor or health care professional if they continue or are  bothersome): -bone pain -muscle pain This list may not describe all possible side effects. Call your doctor for medical advice about side effects. You may report side effects to FDA at 1-800-FDA-1088. Where should I keep my medicine? Keep out of the reach of children. Store pre-filled syringes in a refrigerator between 2 and 8 degrees C (36 and 46 degrees F). Do not freeze. Keep in carton to protect from light. Throw away this medicine if it is left out of the refrigerator for more than 48 hours. Throw away any unused medicine after the expiration date. NOTE: This sheet is a summary. It may not cover all possible information. If you have questions about this medicine, talk to your doctor, pharmacist, or health care provider.    2016, Elsevier/Gold Standard. (2014-09-06 14:30:14)  

## 2016-02-28 NOTE — Progress Notes (Signed)
Pt had injection in Infusion room

## 2016-03-01 ENCOUNTER — Encounter: Payer: Self-pay | Admitting: Oncology

## 2016-03-02 ENCOUNTER — Encounter: Payer: Self-pay | Admitting: Oncology

## 2016-03-03 ENCOUNTER — Encounter: Payer: Self-pay | Admitting: Oncology

## 2016-03-04 ENCOUNTER — Encounter: Payer: Medicare Other | Admitting: Genetic Counselor

## 2016-03-04 ENCOUNTER — Other Ambulatory Visit: Payer: Self-pay | Admitting: *Deleted

## 2016-03-04 ENCOUNTER — Other Ambulatory Visit: Payer: Medicare Other

## 2016-03-04 ENCOUNTER — Telehealth: Payer: Self-pay | Admitting: Oncology

## 2016-03-04 NOTE — Telephone Encounter (Signed)
[facility] for clarification on ACT result. Per Rosann Auerbach in customer support, "there was no circulating tumor DNA" in the sample.

## 2016-03-04 NOTE — Telephone Encounter (Signed)
left msg confirming 8/10 genetics apt time, 7/5 cx per pt and 8/10 is the next available

## 2016-03-06 ENCOUNTER — Telehealth: Payer: Self-pay | Admitting: *Deleted

## 2016-03-06 NOTE — Telephone Encounter (Signed)
Called pt per Dr. Benay Spice: No additional information from [facility] testing, no tumor DNA in sample. Will need another tissue biopsy to send off in the future if there is progression of cancer. Pt voiced understanding.

## 2016-03-07 ENCOUNTER — Encounter: Payer: Self-pay | Admitting: Oncology

## 2016-03-08 ENCOUNTER — Other Ambulatory Visit: Payer: Self-pay | Admitting: Oncology

## 2016-03-10 ENCOUNTER — Ambulatory Visit (HOSPITAL_COMMUNITY)
Admission: RE | Admit: 2016-03-10 | Discharge: 2016-03-10 | Disposition: A | Discharge status: Home / Self Care | Payer: Medicare Other | Source: Ambulatory Visit | Attending: Oncology | Admitting: Oncology

## 2016-03-10 ENCOUNTER — Encounter: Payer: Self-pay | Admitting: *Deleted

## 2016-03-10 ENCOUNTER — Encounter (HOSPITAL_COMMUNITY): Payer: Self-pay

## 2016-03-10 DIAGNOSIS — R911 Solitary pulmonary nodule: Secondary | ICD-10-CM | POA: Diagnosis not present

## 2016-03-10 DIAGNOSIS — C252 Malignant neoplasm of tail of pancreas: Secondary | ICD-10-CM | POA: Diagnosis present

## 2016-03-10 DIAGNOSIS — C787 Secondary malignant neoplasm of liver and intrahepatic bile duct: Secondary | ICD-10-CM | POA: Insufficient documentation

## 2016-03-10 DIAGNOSIS — R918 Other nonspecific abnormal finding of lung field: Secondary | ICD-10-CM | POA: Diagnosis not present

## 2016-03-10 MED ORDER — IOPAMIDOL (ISOVUE-300) INJECTION 61%
100.0000 mL | Freq: Once | INTRAVENOUS | Status: AC | PRN
  Administered 2016-03-10: 100 mL via INTRAVENOUS

## 2016-03-11 ENCOUNTER — Other Ambulatory Visit: Payer: Self-pay | Admitting: *Deleted

## 2016-03-11 ENCOUNTER — Ambulatory Visit: Payer: Medicare Other

## 2016-03-11 ENCOUNTER — Other Ambulatory Visit (HOSPITAL_BASED_OUTPATIENT_CLINIC_OR_DEPARTMENT_OTHER): Payer: Medicare Other

## 2016-03-11 ENCOUNTER — Ambulatory Visit (HOSPITAL_BASED_OUTPATIENT_CLINIC_OR_DEPARTMENT_OTHER): Payer: Medicare Other

## 2016-03-11 ENCOUNTER — Ambulatory Visit (HOSPITAL_BASED_OUTPATIENT_CLINIC_OR_DEPARTMENT_OTHER): Payer: Medicare Other | Admitting: Nurse Practitioner

## 2016-03-11 ENCOUNTER — Telehealth: Payer: Self-pay | Admitting: Oncology

## 2016-03-11 ENCOUNTER — Encounter: Payer: Self-pay | Admitting: *Deleted

## 2016-03-11 VITALS — BP 114/59 | HR 91 | Temp 98.2°F | Resp 17 | Ht 62.0 in | Wt 109.2 lb

## 2016-03-11 DIAGNOSIS — C252 Malignant neoplasm of tail of pancreas: Secondary | ICD-10-CM

## 2016-03-11 DIAGNOSIS — C787 Secondary malignant neoplasm of liver and intrahepatic bile duct: Secondary | ICD-10-CM

## 2016-03-11 DIAGNOSIS — Z5111 Encounter for antineoplastic chemotherapy: Secondary | ICD-10-CM

## 2016-03-11 DIAGNOSIS — G620 Drug-induced polyneuropathy: Secondary | ICD-10-CM | POA: Diagnosis not present

## 2016-03-11 DIAGNOSIS — Z95828 Presence of other vascular implants and grafts: Secondary | ICD-10-CM

## 2016-03-11 DIAGNOSIS — R627 Adult failure to thrive: Secondary | ICD-10-CM | POA: Diagnosis not present

## 2016-03-11 DIAGNOSIS — Z006 Encounter for examination for normal comparison and control in clinical research program: Secondary | ICD-10-CM

## 2016-03-11 LAB — CBC WITH DIFFERENTIAL/PLATELET
BASO%: 0.6 % (ref 0.0–2.0)
BASOS ABS: 0 10*3/uL (ref 0.0–0.1)
EOS%: 2.5 % (ref 0.0–7.0)
Eosinophils Absolute: 0.2 10*3/uL (ref 0.0–0.5)
HCT: 29.4 % — ABNORMAL LOW (ref 34.8–46.6)
HGB: 9.3 g/dL — ABNORMAL LOW (ref 11.6–15.9)
LYMPH%: 17.6 % (ref 14.0–49.7)
MCH: 31.1 pg (ref 25.1–34.0)
MCHC: 31.7 g/dL (ref 31.5–36.0)
MCV: 98.3 fL (ref 79.5–101.0)
MONO#: 0.8 10*3/uL (ref 0.1–0.9)
MONO%: 10.6 % (ref 0.0–14.0)
NEUT#: 5.4 10*3/uL (ref 1.5–6.5)
NEUT%: 68.7 % (ref 38.4–76.8)
Platelets: 89 10*3/uL — ABNORMAL LOW (ref 145–400)
RBC: 2.99 10*6/uL — AB (ref 3.70–5.45)
RDW: 18.8 % — ABNORMAL HIGH (ref 11.2–14.5)
WBC: 7.9 10*3/uL (ref 3.9–10.3)
lymph#: 1.4 10*3/uL (ref 0.9–3.3)

## 2016-03-11 LAB — COMPREHENSIVE METABOLIC PANEL
ALT: 31 U/L (ref 0–55)
AST: 35 U/L — AB (ref 5–34)
Albumin: 3 g/dL — ABNORMAL LOW (ref 3.5–5.0)
Alkaline Phosphatase: 253 U/L — ABNORMAL HIGH (ref 40–150)
Anion Gap: 8 mEq/L (ref 3–11)
BUN: 5.9 mg/dL — AB (ref 7.0–26.0)
CHLORIDE: 105 meq/L (ref 98–109)
CO2: 26 mEq/L (ref 22–29)
Calcium: 8.9 mg/dL (ref 8.4–10.4)
Creatinine: 0.6 mg/dL (ref 0.6–1.1)
EGFR: >90 mL/min/{1.73_m2} (ref >90)
GLUCOSE: 114 mg/dL (ref 70–140)
POTASSIUM: 4 meq/L (ref 3.5–5.1)
SODIUM: 139 meq/L (ref 136–145)
Total Bilirubin: <0.3 mg/dL (ref 0.20–1.20)
Total Protein: 6 g/dL — ABNORMAL LOW (ref 6.4–8.3)

## 2016-03-11 MED ORDER — SODIUM CHLORIDE 0.9 % IV SOLN
1920.0000 mg/m2 | INTRAVENOUS | Status: DC
  Administered 2016-03-11: 2800 mg via INTRAVENOUS
  Filled 2016-03-11: qty 56

## 2016-03-11 MED ORDER — ATROPINE SULFATE 1 MG/ML IJ SOLN
0.5000 mg | Freq: Once | INTRAMUSCULAR | Status: AC | PRN
  Administered 2016-03-11: 0.5 mg via INTRAVENOUS

## 2016-03-11 MED ORDER — FOSAPREPITANT DIMEGLUMINE INJECTION 150 MG
Freq: Once | INTRAVENOUS | Status: AC
  Administered 2016-03-11: 11:00:00 via INTRAVENOUS
  Filled 2016-03-11: qty 5

## 2016-03-11 MED ORDER — PALONOSETRON HCL INJECTION 0.25 MG/5ML
0.2500 mg | Freq: Once | INTRAVENOUS | Status: AC
  Administered 2016-03-11: 0.25 mg via INTRAVENOUS

## 2016-03-11 MED ORDER — PALONOSETRON HCL INJECTION 0.25 MG/5ML
INTRAVENOUS | Status: AC
  Filled 2016-03-11: qty 5

## 2016-03-11 MED ORDER — IRINOTECAN HCL CHEMO INJECTION 100 MG/5ML
150.0000 mg/m2 | Freq: Once | INTRAVENOUS | Status: AC
  Administered 2016-03-11: 220 mg via INTRAVENOUS
  Filled 2016-03-11: qty 9

## 2016-03-11 MED ORDER — ATROPINE SULFATE 1 MG/ML IJ SOLN
INTRAMUSCULAR | Status: AC
  Filled 2016-03-11: qty 1

## 2016-03-11 MED ORDER — SODIUM CHLORIDE 0.9 % IV SOLN
Freq: Once | INTRAVENOUS | Status: AC
  Administered 2016-03-11: 11:00:00 via INTRAVENOUS

## 2016-03-11 MED ORDER — LEUCOVORIN CALCIUM INJECTION 350 MG
400.0000 mg/m2 | Freq: Once | INTRAVENOUS | Status: AC
  Administered 2016-03-11: 588 mg via INTRAVENOUS
  Filled 2016-03-11: qty 29.4

## 2016-03-11 MED ORDER — SODIUM CHLORIDE 0.9 % IJ SOLN
10.0000 mL | INTRAMUSCULAR | Status: DC | PRN
  Administered 2016-03-11: 10 mL via INTRAVENOUS
  Filled 2016-03-11: qty 10

## 2016-03-11 NOTE — Progress Notes (Signed)
Oncology Nurse Navigator Documentation  Oncology Nurse Navigator Flowsheets 03/11/2016  Navigator Location [facility]  Navigator Encounter Type Treatment  Telephone -  Abnormal Finding Date -  Confirmed Diagnosis Date -  Patient Visit Type MedOnc  Treatment Phase Active Tx--FOLFIRINOX (Oxaliplatin held)  Barriers/Navigation Needs Coordination of Care  Education -  Interventions Coordination of Care  Coordination of Care Appts--wants to go to the beach in [place] and asking if OK to delay next chemo to week of 8/7?  Education Method -  Support Groups/Services -  Acuity Level 2  Time Spent with Patient 15  Confirmed w/MD OK to delay tx till that week. Family will call after discussion with the date they are requesting her treatment to occur. Aulani is pleased with her CT results, but expressed that she hoped all trace of cancer would be gone. Informed her that is not a realistic expectation-she needs to celebrate the small victories. Tells RN that she had another fall recently trying to get to the door to see HVAC person-got light headed after standing up too quickly. Reminded her that she needs to get up slowly--

## 2016-03-11 NOTE — Progress Notes (Addendum)
[place] OFFICE PROGRESS NOTE   Diagnosis:  Pancreas cancer  INTERVAL HISTORY:   Wendy Palmer returns as scheduled. She completed cycle 11 FOLFIRINOX 02/26/2016. She denies nausea/vomiting. She had some mouth sores. The mouth sores have resolved. No diarrhea following chemotherapy. She had loose stools yesterday following the CT scan. She relates the diarrhea to the contrast. She is doing better with oral intake. She describes the cold sensitivity as "mild". She has mild persistent numbness in the fingertips. She had difficulty opening the tube of Emla cream recently. No numbness or tingling in her feet. She denies pain. She continues to have vaginal discharge but thinks this has improved. She had shortness of breath this week which she relates to anxiety. No fever or cough.  Objective:  Vital signs in last 24 hours:  Blood pressure 114/59, pulse 91, temperature 98.2 F (36.8 C), temperature source Oral, resp. rate 17, height 5\' 2"  (1.575 m), weight 109 lb 3.2 oz (49.533 kg), SpO2 98 %.    HEENT: No thrush or ulcers. Resp: Lungs clear bilaterally. Cardio: Regular rate and rhythm. GI: Abdomen soft and nontender. No mass. No organomegaly. Vascular: No leg edema. Calves soft and nontender. Neuro: Vibratory sense intact over the fingertips per tuning fork exam.  Skin: No rash. Port-A-Cath without erythema.    Lab Results:  Lab Results  Component Value Date   WBC 7.9 03/11/2016   HGB 9.3* 03/11/2016   HCT 29.4* 03/11/2016   MCV 98.3 03/11/2016   PLT 89* 03/11/2016   NEUTROABS 5.4 03/11/2016    Imaging:  Ct Chest W Contrast  03/10/2016  CLINICAL DATA:  Pancreatic cancer restaging.  Recist 1.1 protocol. EXAM: CT CHEST, ABDOMEN, AND PELVIS WITH CONTRAST TECHNIQUE: Multidetector CT imaging of the chest, abdomen and pelvis was performed following the standard protocol during bolus administration of intravenous contrast. CONTRAST:  127mL ISOVUE-300 IOPAMIDOL  (ISOVUE-300) INJECTION 61% COMPARISON:  01/14/2016. FINDINGS: RECIST 1.1 Target Lesions: 1. Right hepatic lobe lesion measures 0.8 cm (image 55 series 4) today compared to 1.1 cm on 01/14/2016. 2. Pancreatic tail mass measures 3.8 cm today (image 32 series 2) compared to 4.4 cm previously. Non-target Lesions: 1. Multiple hepatic metastases -present 2. Gastrohepatic ligament lymphadenopathy (image 57 series 4) -present 3. Retroperitoneal lymphadenopathy (image 59 series for)-present CT CHEST FINDINGS Mediastinum/Lymph Nodes: No mediastinal lymphadenopathy. There is no hilar lymphadenopathy. Stable 8 mm short axis right paratracheal lymph node. There is no axillary lymphadenopathy. The heart size is normal. No pericardial effusion. The esophagus has normal imaging features. Right Port-A-Cath tip is positioned at the SVC/ RA junction. Lungs/Pleura: New 3 mm right upper lobe nodule seen on image 50 series 7. Stable right middle lobe scarring. Several tiny perifissural nodules on the right are unchanged. Tiny left upper lobe pulmonary nodule (image 49 series 7) is unchanged. Lingular scarring is stable. Previous ill-defined nodularity in the left lung base has resolved in the interval. No pulmonary edema or pleural effusion Musculoskeletal: Bone windows reveal no worrisome lytic or sclerotic osseous lesions. CT ABDOMEN PELVIS FINDINGS Hepatobiliary: Multiple liver lesions are again noted. Dominant right hepatic lesion which was 1.1 x 1.0 cm previously now measures 0.8 x 0.8 cm (image 91 series 6). 6 x 5 mm right liver lesion on image 57 series 4 today was 6 mm previously. 6 mm hypodensity posterior right liver (image 98 series 6) not definitely seen on prior study. Diffuse gallbladder wall thickening again noted. No intrahepatic or extrahepatic biliary dilation. Pancreas: Pancreatic tail mass  measures 3.8 x 1.5 cm today compared to 4.4 x 2.2 cm previously. Spleen: Heterogeneous splenic perfusion is nonspecific.  Adrenals/Urinary Tract: Right adrenal gland normal. Left adrenal gland without mass. No enhancing lesion or hydronephrosis in either kidney. No evidence for hydroureter. The urinary bladder appears normal for the degree of distention. Stomach/Bowel: Stomach is distended with food and contrast. Duodenum is normally positioned as is the ligament of Treitz. No small bowel wall thickening. No small bowel dilatation. The terminal ileum is normal. The appendix is normal. Diverticular changes are noted in the left colon without evidence of diverticulitis. Vascular/Lymphatic: There is abdominal aortic atherosclerosis without aneurysm. 7 mm gastrohepatic ligament lymph node is stable. Soft tissue fullness encasing the celiac axis is not substantially changed. No pelvic sidewall lymphadenopathy. Reproductive: Small fibroid noted uterine fundus. There is no adnexal mass. Other: No intraperitoneal free fluid. Musculoskeletal: Bone windows reveal no worrisome lytic or sclerotic osseous lesions. IMPRESSION: 1. New 3 mm right upper lobe pulmonary nodule may be infectious/inflammatory. Attention on follow-up recommended. 2. Other stable scattered tiny bilateral pulmonary nodules noted. 3. Continued further decrease in size of pancreatic tail mass. 4. Previously characterized hepatic metastases have decreased in the interval. There is a new hypodensity in the posterior right liver in close attention to this region on follow-up recommended. 5. No substantial change in abnormal soft tissue encasing the celiac axis and extending along the left para-aortic retroperitoneal space. Electronically Signed   By: Misty Stanley M.D.   On: 03/10/2016 15:22   Ct Abdomen Pelvis W Contrast  03/10/2016  CLINICAL DATA:  Pancreatic cancer restaging.  Recist 1.1 protocol. EXAM: CT CHEST, ABDOMEN, AND PELVIS WITH CONTRAST TECHNIQUE: Multidetector CT imaging of the chest, abdomen and pelvis was performed following the standard protocol during bolus  administration of intravenous contrast. CONTRAST:  158mL ISOVUE-300 IOPAMIDOL (ISOVUE-300) INJECTION 61% COMPARISON:  01/14/2016. FINDINGS: RECIST 1.1 Target Lesions: 1. Right hepatic lobe lesion measures 0.8 cm (image 55 series 4) today compared to 1.1 cm on 01/14/2016. 2. Pancreatic tail mass measures 3.8 cm today (image 32 series 2) compared to 4.4 cm previously. Non-target Lesions: 1. Multiple hepatic metastases -present 2. Gastrohepatic ligament lymphadenopathy (image 57 series 4) -present 3. Retroperitoneal lymphadenopathy (image 59 series for)-present CT CHEST FINDINGS Mediastinum/Lymph Nodes: No mediastinal lymphadenopathy. There is no hilar lymphadenopathy. Stable 8 mm short axis right paratracheal lymph node. There is no axillary lymphadenopathy. The heart size is normal. No pericardial effusion. The esophagus has normal imaging features. Right Port-A-Cath tip is positioned at the SVC/ RA junction. Lungs/Pleura: New 3 mm right upper lobe nodule seen on image 50 series 7. Stable right middle lobe scarring. Several tiny perifissural nodules on the right are unchanged. Tiny left upper lobe pulmonary nodule (image 49 series 7) is unchanged. Lingular scarring is stable. Previous ill-defined nodularity in the left lung base has resolved in the interval. No pulmonary edema or pleural effusion Musculoskeletal: Bone windows reveal no worrisome lytic or sclerotic osseous lesions. CT ABDOMEN PELVIS FINDINGS Hepatobiliary: Multiple liver lesions are again noted. Dominant right hepatic lesion which was 1.1 x 1.0 cm previously now measures 0.8 x 0.8 cm (image 91 series 6). 6 x 5 mm right liver lesion on image 57 series 4 today was 6 mm previously. 6 mm hypodensity posterior right liver (image 98 series 6) not definitely seen on prior study. Diffuse gallbladder wall thickening again noted. No intrahepatic or extrahepatic biliary dilation. Pancreas: Pancreatic tail mass measures 3.8 x 1.5 cm today compared to 4.4 x  2.2 cm  previously. Spleen: Heterogeneous splenic perfusion is nonspecific. Adrenals/Urinary Tract: Right adrenal gland normal. Left adrenal gland without mass. No enhancing lesion or hydronephrosis in either kidney. No evidence for hydroureter. The urinary bladder appears normal for the degree of distention. Stomach/Bowel: Stomach is distended with food and contrast. Duodenum is normally positioned as is the ligament of Treitz. No small bowel wall thickening. No small bowel dilatation. The terminal ileum is normal. The appendix is normal. Diverticular changes are noted in the left colon without evidence of diverticulitis. Vascular/Lymphatic: There is abdominal aortic atherosclerosis without aneurysm. 7 mm gastrohepatic ligament lymph node is stable. Soft tissue fullness encasing the celiac axis is not substantially changed. No pelvic sidewall lymphadenopathy. Reproductive: Small fibroid noted uterine fundus. There is no adnexal mass. Other: No intraperitoneal free fluid. Musculoskeletal: Bone windows reveal no worrisome lytic or sclerotic osseous lesions. IMPRESSION: 1. New 3 mm right upper lobe pulmonary nodule may be infectious/inflammatory. Attention on follow-up recommended. 2. Other stable scattered tiny bilateral pulmonary nodules noted. 3. Continued further decrease in size of pancreatic tail mass. 4. Previously characterized hepatic metastases have decreased in the interval. There is a new hypodensity in the posterior right liver in close attention to this region on follow-up recommended. 5. No substantial change in abnormal soft tissue encasing the celiac axis and extending along the left para-aortic retroperitoneal space. Electronically Signed   By: Misty Stanley M.D.   On: 03/10/2016 15:22    Medications: I have reviewed the patient's current medications.  Assessment/Plan: 1. Pancreas cancer  Foundation 1 testing on peripheral blood 02/12/2016-no alteration detected  MRI abdomen 08/27/2015  revealed a pancreas mass, adenopathy, and liver metastases  CTs 09/10/2015 with pancreas body/tail mass, liver metastases, lymphadenopathy, innumerable tiny pulmonary nodules  Ultrasound-guided biopsy of a right liver lesion 09/10/2015 confirmed adenocarcinoma  Enrollment on the SWOG S1313 study, randomized to the standard FOLFIRINOX arm  Cycle 1 FOLFIRINOX 09/25/2015  Cycle 2 FOLFIRINOX 10/09/2015  Cycle 3 FOLFIRINOX 10/23/2015  Cycle 4 FOLFIRINOX 11/05/2015  CT 11/19/2015- decrease in the size of liver lesions and the pancreas mass, ? Increased upper abdominal/retroperitoneal adenopath  Cycle 5 FOLFIRINOX 11/20/2015  Cycle 6 FOLFIRINOX 12/04/2015  Cycle 7 FOLFIRINOX 12/18/2015  Cycle 8 FOLFIRINOX 01/01/2016  Restaging CTs 01/14/2016-decrease in size of liver lesions and pancreas mass, stable to decreased upper abdominal lymphadenopathy, no evidence of disease progression  Cycle 9 FOLFIRINOX 01/15/2016  Cycle 10 FOLFIRINOX 02/12/2016 ( chemotherapy dose reduced per protocol based on toxicity)  Cycle 11 FOLFIRINOX 02/26/2016  Restaging CTs 03/10/2016 showed further decrease in the size of a pancreatic tail mass. Decrease in hepatic metastases. New 3 mm right upper lobe pulmonary nodule possibly infectious/inflammatory. New 6 mm hypodensity in the posterior right liver. No substantial change in abnormal soft tissue encasing the celiac axis and extending along the left periaortic retroperitoneal space. Stable 7 mm gastrohepatic ligament lymph node.  Treatment changed to FOLFIRI on a 3 week schedule beginning 03/11/2016  2. Pain secondary to #1, improved  3. History of constipation-likely secondary to narcotics  4. Mild oxaliplatin neuropathy-mild interference with activity  5.History of mucositis secondary to chemotherapy  6. Depression-grade 1, not interfering with ADLs   Disposition: Wendy Palmer appears stable. She has completed 11 cycles of FOLFIRINOX. The  restaging CT evaluation shows further improvement in the pancreatic tail mass and liver metastases. Dr. Benay Spice feels that she has likely obtained maximal benefit from the oxaliplatin and due to concern that she will develop progressive neuropathy symptoms, recommends  discontinuation of oxaliplatin from the regimen at this time. Dr. Benay Spice further recommends adjusting the treatment regimen from 2 weeks to 3 weeks to allow her additional time to recover between treatments. She is in agreement with this plan. She will withdraw from the clinical trial at this time.  The plan is to proceed with FOLFIRI today (cycle 12 systemic therapy). She will return for a follow-up visit and cycle 13 in 3 weeks. She will contact the office in the interim with any problems.  Patient seen with Dr. Benay Spice. CT report/images reviewed on the computer with Wendy Palmer and her daughter by Dr. Benay Spice. 25 minutes were spent face-to-face at today's visit with the majority of that time involved in counseling/coordination of care.  ECOG performance status measured at 1.    Ned Card ANP/GNP-BC   03/11/2016  9:46 AM  This was a shared visit with Ned Card. We reviewed the CT images with Wendy Palmer.  I think it is very unlikely the rt. Lung nodular density or the new hypodense liver lesion represent disease progression.  She has developed  Neuropathy and failure to thrive as cumulative toxicities from the FOLFIRINOX.  I recommend changing to a 3 week chemotherapy schedule and stopping the oxaliplaitn. She agrees.  Wendy Palmer.

## 2016-03-11 NOTE — Telephone Encounter (Signed)
Made appts for 8/2 + 8/4. Pt will get AVS from infusion prior to leaving today.

## 2016-03-11 NOTE — Progress Notes (Signed)
OK to treat today with PLT today - per Lattie Haw NP

## 2016-03-11 NOTE — Telephone Encounter (Signed)
DTR STOPPED BY SCHEDULING TO MOVE 8/2 APPOINTMENTS TO 8/7. PER 2ND 7/12 POF MOVED APPOINTMENTS FOR 8/2 AND 8/4 TO 8/7 AND 8/9. ALSO PER DTR MOVED 8/10 GENETICS TO 8/14 - FAMILY REQUESTING KAREN.

## 2016-03-11 NOTE — Progress Notes (Signed)
03/11/2016 1025 SWOG [id number] study The patient returns to clinic accompanied by her daughter, Bonnita Nasuti.  She reports feeling well today.  She continues with intermittent fatigue that is worse 4-5 days after pump DC.  She said her appetite is also worse on those days, with some intermittent nausea, and that she really has to force herself to eat and drink during that time.  She had a mouth sore during that time (02/29/16 through 03/04/16)  that has resolved.  Today her appetite is fair and she reports that she still has to make herself eat.  Her weight has remained stable since last treatment.  She reports occasional dyspnea with exertion,  mild anxiety, and mild depression. She says she has mild numbness/tingling and that she has had a harder time with function, example: could not open tube of EMLA cream. She said she still has a slight amount of vaginal drainage that is clear.  She denies any bleeding or bruising.  She denies diarrhea, except from the CT contrast yesterday.  She denies pain.   Dr. Benay Spice was in to see the patient.  Dr. Benay Spice reviewed the CT scan with the patient and her daughter.  The patient's disease remains stable with no progression.  The patient and MD both decided for the patient to come off of the study treatment.  Dr. Benay Spice and the patient agreed the Oxaliplatin will be dropped from treatment regimen due to concern for the increased chance for peripheral neuropathy toxicity.  The patient has a grade 2 persistent peripheral neuropathy today.  Although the protocol stated to decrease Oxaliplatin to the next lowest dose, Dr. Benay Spice said it is his judgement and in the patient's best interest to stop the Oxaliplatin.  The patient also requested to change the treatment schedule to every 3 weeks. Dr. Benay Spice also stated he would scan the patient every 12 weeks instead of every 8 weeks with the patient being treated on an every 3 week schedule.  This RN confirmed with the patient and Dr.  Benay Spice to withdraw patient from protocol treatment.  This RN will continue to follow the patient for progression and will notify the study chair of above.  The above was checked and confirmed with second research RN, Tyrell Antonio.  Marcellus Scott, RN, BSN, MHA, OCN  03/11/2016 1530 Received email response from study chair, Dr. Kathaleen Bury stating "The patient is now off study and further treatment is not prescribed by the protocol and at physician discretion" Marcellus Scott, RN, BSN, MHA, OCN

## 2016-03-11 NOTE — Patient Instructions (Signed)

## 2016-03-11 NOTE — Patient Instructions (Signed)
[place] [facility] Discharge Instructions for Patients Receiving Chemotherapy  Today you received the following chemotherapy agents Irinotecan, Leucovorin, Adrucil  To help prevent nausea and vomiting after your treatment, we encourage you to take your nausea medication. No Zofran for 3 days, take Compazine for nausea instead.   If you develop nausea and vomiting that is not controlled by your nausea medication, call the clinic.   BELOW ARE SYMPTOMS THAT SHOULD BE REPORTED IMMEDIATELY:  *FEVER GREATER THAN 100.5 F  *CHILLS WITH OR WITHOUT FEVER  NAUSEA AND VOMITING THAT IS NOT CONTROLLED WITH YOUR NAUSEA MEDICATION  *UNUSUAL SHORTNESS OF BREATH  *UNUSUAL BRUISING OR BLEEDING  TENDERNESS IN MOUTH AND THROAT WITH OR WITHOUT PRESENCE OF ULCERS  *URINARY PROBLEMS  *BOWEL PROBLEMS  UNUSUAL RASH Items with * indicate a potential emergency and should be followed up as soon as possible.  Feel free to call the clinic you have any questions or concerns. The clinic phone number is [phone].  Please show the [place] at check-in to the Emergency Department and triage nurse.

## 2016-03-13 ENCOUNTER — Ambulatory Visit (HOSPITAL_BASED_OUTPATIENT_CLINIC_OR_DEPARTMENT_OTHER): Payer: Medicare Other

## 2016-03-13 ENCOUNTER — Ambulatory Visit: Payer: Medicare Other

## 2016-03-13 VITALS — BP 120/58 | HR 100 | Temp 98.2°F | Resp 17

## 2016-03-13 DIAGNOSIS — Z006 Encounter for examination for normal comparison and control in clinical research program: Secondary | ICD-10-CM | POA: Diagnosis not present

## 2016-03-13 DIAGNOSIS — C252 Malignant neoplasm of tail of pancreas: Secondary | ICD-10-CM | POA: Diagnosis present

## 2016-03-13 MED ORDER — HEPARIN SOD (PORK) LOCK FLUSH 100 UNIT/ML IV SOLN
500.0000 [IU] | Freq: Once | INTRAVENOUS | Status: AC | PRN
  Administered 2016-03-13: 500 [IU]
  Filled 2016-03-13: qty 5

## 2016-03-13 MED ORDER — SODIUM CHLORIDE 0.9% FLUSH
10.0000 mL | INTRAVENOUS | Status: DC | PRN
  Administered 2016-03-13: 10 mL
  Filled 2016-03-13: qty 10

## 2016-03-13 MED ORDER — PEGFILGRASTIM INJECTION 6 MG/0.6ML ~~LOC~~
6.0000 mg | PREFILLED_SYRINGE | Freq: Once | SUBCUTANEOUS | Status: AC
  Administered 2016-03-13: 6 mg via SUBCUTANEOUS
  Filled 2016-03-13: qty 0.6

## 2016-03-13 NOTE — Progress Notes (Signed)
Injection to be given in Infusion room

## 2016-03-13 NOTE — Patient Instructions (Signed)
Pegfilgrastim injection What is this medicine? PEGFILGRASTIM (PEG fil gra stim) is a long-acting granulocyte colony-stimulating factor that stimulates the growth of neutrophils, a type of white blood cell important in the body's fight against infection. It is used to reduce the incidence of fever and infection in patients with certain types of cancer who are receiving chemotherapy that affects the bone marrow, and to increase survival after being exposed to high doses of radiation. This medicine may be used for other purposes; ask your health care provider or pharmacist if you have questions. What should I tell my health care provider before I take this medicine? They need to know if you have any of these conditions: -kidney disease -latex allergy -ongoing radiation therapy -sickle cell disease -skin reactions to acrylic adhesives (On-Body Injector only) -an unusual or allergic reaction to pegfilgrastim, filgrastim, other medicines, foods, dyes, or preservatives -pregnant or trying to get pregnant -breast-feeding How should I use this medicine? This medicine is for injection under the skin. If you get this medicine at home, you will be taught how to prepare and give the pre-filled syringe or how to use the On-body Injector. Refer to the patient Instructions for Use for detailed instructions. Use exactly as directed. Take your medicine at regular intervals. Do not take your medicine more often than directed. It is important that you put your used needles and syringes in a special sharps container. Do not put them in a trash can. If you do not have a sharps container, call your pharmacist or healthcare provider to get one. Talk to your pediatrician regarding the use of this medicine in children. While this drug may be prescribed for selected conditions, precautions do apply. Overdosage: If you think you have taken too much of this medicine contact a poison control center or emergency room at  once. NOTE: This medicine is only for you. Do not share this medicine with others. What if I miss a dose? It is important not to miss your dose. Call your doctor or health care professional if you miss your dose. If you miss a dose due to an On-body Injector failure or leakage, a new dose should be administered as soon as possible using a single prefilled syringe for manual use. What may interact with this medicine? Interactions have not been studied. Give your health care provider a list of all the medicines, herbs, non-prescription drugs, or dietary supplements you use. Also tell them if you smoke, drink alcohol, or use illegal drugs. Some items may interact with your medicine. This list may not describe all possible interactions. Give your health care provider a list of all the medicines, herbs, non-prescription drugs, or dietary supplements you use. Also tell them if you smoke, drink alcohol, or use illegal drugs. Some items may interact with your medicine. What should I watch for while using this medicine? You may need blood work done while you are taking this medicine. If you are going to need a MRI, CT scan, or other procedure, tell your doctor that you are using this medicine (On-Body Injector only). What side effects may I notice from receiving this medicine? Side effects that you should report to your doctor or health care professional as soon as possible: -allergic reactions like skin rash, itching or hives, swelling of the face, lips, or tongue -dizziness -fever -pain, redness, or irritation at site where injected -pinpoint red spots on the skin -red or dark-brown urine -shortness of breath or breathing problems -stomach or side pain, or pain   at the shoulder -swelling -tiredness -trouble passing urine or change in the amount of urine Side effects that usually do not require medical attention (report to your doctor or health care professional if they continue or are  bothersome): -bone pain -muscle pain This list may not describe all possible side effects. Call your doctor for medical advice about side effects. You may report side effects to FDA at 1-800-FDA-1088. Where should I keep my medicine? Keep out of the reach of children. Store pre-filled syringes in a refrigerator between 2 and 8 degrees C (36 and 46 degrees F). Do not freeze. Keep in carton to protect from light. Throw away this medicine if it is left out of the refrigerator for more than 48 hours. Throw away any unused medicine after the expiration date. NOTE: This sheet is a summary. It may not cover all possible information. If you have questions about this medicine, talk to your doctor, pharmacist, or health care provider.    2016, Elsevier/Gold Standard. (2014-09-06 14:30:14)  

## 2016-03-19 ENCOUNTER — Telehealth: Payer: Self-pay | Admitting: *Deleted

## 2016-03-19 NOTE — Telephone Encounter (Signed)
Oncology Nurse Navigator Documentation  Oncology Nurse Navigator Flowsheets 03/19/2016  Navigator Location [facility]  Navigator Encounter Type Telephone  Telephone Incoming Call;Appt Confirmation/Clarification--requesting to change appointments on 8/7 to 8/8 or 8/9 as late in the day as possible due to going to the beach.  Abnormal Finding Date -  Confirmed Diagnosis Date -  Patient Visit Type -  Treatment Phase -  Barriers/Navigation Needs -  Education -  Interventions Coordination of Care--POF sent to scheduler for requested changes  Coordination of Care Appts  Education Method -  Support Groups/Services -  Acuity -  Time Spent with Patient 15

## 2016-03-21 ENCOUNTER — Telehealth: Payer: Self-pay | Admitting: Oncology

## 2016-03-21 NOTE — Telephone Encounter (Signed)
Per 7/20 pof moved 8/7 appointments for 8/8 or 8/9 as late as possible. No late appointments on 8/9 and appointments moved to 8/8. Spoke with patient and per patient she actually requested 8/9 - patient informed no late appointment available 8/9. Per patient appointments moved to 8/10 lab/flush/fu/tx - pump/inj moved to 8/12. Patient aware of new appointment date/times. Message to susan re appointments being 8/10 per patient.

## 2016-03-23 ENCOUNTER — Telehealth: Payer: Self-pay | Admitting: *Deleted

## 2016-03-23 NOTE — Telephone Encounter (Signed)
  Oncology Nurse Navigator Documentation  Navigator Location: [facility] (03/23/16 1103) Navigator Encounter Type: Telephone (03/23/16 1103) Telephone: Lahoma Crocker Call;Appt Confirmation/Clarification (03/23/16 1103)   Made Olin Hauser aware that treatment delay to 04/09/16 will be OK.

## 2016-04-01 ENCOUNTER — Ambulatory Visit: Payer: Medicare Other

## 2016-04-01 ENCOUNTER — Ambulatory Visit: Payer: Medicare Other | Admitting: Oncology

## 2016-04-01 ENCOUNTER — Other Ambulatory Visit: Payer: Medicare Other

## 2016-04-02 ENCOUNTER — Encounter (HOSPITAL_COMMUNITY): Payer: Self-pay | Admitting: Emergency Medicine

## 2016-04-02 ENCOUNTER — Emergency Department (HOSPITAL_COMMUNITY)
Admission: EM | Admit: 2016-04-02 | Discharge: 2016-04-03 | Disposition: A | Discharge status: Home / Self Care | Payer: Medicare Other | Attending: Emergency Medicine | Admitting: Emergency Medicine

## 2016-04-02 DIAGNOSIS — D735 Infarction of spleen: Secondary | ICD-10-CM

## 2016-04-02 DIAGNOSIS — C787 Secondary malignant neoplasm of liver and intrahepatic bile duct: Secondary | ICD-10-CM | POA: Diagnosis not present

## 2016-04-02 DIAGNOSIS — M5442 Lumbago with sciatica, left side: Secondary | ICD-10-CM | POA: Insufficient documentation

## 2016-04-02 DIAGNOSIS — I10 Essential (primary) hypertension: Secondary | ICD-10-CM | POA: Diagnosis not present

## 2016-04-02 DIAGNOSIS — Z79899 Other long term (current) drug therapy: Secondary | ICD-10-CM | POA: Diagnosis not present

## 2016-04-02 DIAGNOSIS — C259 Malignant neoplasm of pancreas, unspecified: Secondary | ICD-10-CM | POA: Insufficient documentation

## 2016-04-02 DIAGNOSIS — M545 Low back pain: Secondary | ICD-10-CM

## 2016-04-02 DIAGNOSIS — Z87891 Personal history of nicotine dependence: Secondary | ICD-10-CM | POA: Insufficient documentation

## 2016-04-02 HISTORY — DX: Malignant (primary) neoplasm, unspecified: C80.1

## 2016-04-02 NOTE — ED Triage Notes (Signed)
Patient mid lower back pain x2 days. Denies injury to same, no loss of bladder or bowel. History of pancreatic CA, last chemo two weeks ago. Reports vaginal discharge x1 month, denies urinary symptoms. A&O x4, ambulatory with steady gait.

## 2016-04-02 NOTE — ED Provider Notes (Signed)
[facility]-EMERGENCY DEPT Provider Note   CSN: [id number] Arrival date & time: 04/02/16  1933  First Provider Contact:  First MD Initiated Contact with Patient 04/02/16 2328     By signing my name below, I, Wendy Palmer, attest that this documentation has been prepared under the direction and in the presence of Ankit Nanavati, MD. Electronically Signed: Emmanuella Palmer, ED Scribe. 04/02/16. 3:21 AM.   History   Chief Complaint Chief Complaint  Patient presents with  . Back Pain    HPI Comments: Wendy Palmer is a 65 y.o. female with a hx of pancreatic and liver cancer, and HTN who presents to the Emergency Department complaining of sudden onset of moderate left lower back pain onset 2 weeks ago. She reports associated bilateral leg weakness and bilateral leg swelling (worse on left) onset tonight. She states that laying flat makes the back pain worse and denies that the pain is worse with deep breathing. No alleviating factors noted. Pt has not tried any medications PTA. She states that she normally takes hydromorphone but was not able to take it PTA. She reports that she is concerned if the cancer has spread to her back. She adds that her last chemo treatment was 2 weeks ago. No known recent injuries or falls. She denies any abdominal pain, numbness/tingling in BLE, bowel/bladder incontinence, saddle anesthesia, urinary symptoms, or any calf pain. Pt has upcoming chemo treatment appointment in one week.   The history is provided by the patient. No language interpreter was used.  Back Pain   This is a new problem. The current episode started more than 1 week ago. The problem has been gradually worsening. The pain is moderate. Treatments tried: Hydrocodone. The treatment provided no relief.    Past Medical History:  Diagnosis Date  . Cancer (HCC)    Pancreatic  . Depression   . Diverticulosis   . Hypertension   . Patella fracture     Patient Active Problem List   Diagnosis Date  Noted  . Facial flushing 02/14/2016  . Liver mass   . Cancer of pancreas, tail (HCC) 09/05/2015  . Hot flashes, menopausal 03/13/2014  . Rosacea 12/21/2012  . Essential hypertension 12/21/2012  . Hyperlipemia 12/21/2012    Past Surgical History:  Procedure Laterality Date  . REFRACTIVE SURGERY    . TUBAL LIGATION      OB History    No data available       Home Medications    Prior to Admission medications   Medication Sig Start Date End Date Taking? Authorizing Provider  HYDROcodone-acetaminophen (NORCO/VICODIN) 5-325 MG tablet Take 1 tablet by mouth every 6 (six) hours as needed for moderate pain. Take 1 tablet 4 times daily as needed 12/04/15  Yes Gary B Sherrill, MD  HYDROmorphone (DILAUDID) 4 MG tablet Take 1-2 tablets (4-8 mg total) by mouth every 4 (four) hours as needed for severe pain. 02/26/16  Yes Gary B Sherrill, MD  imipramine (TOFRANIL) 50 MG tablet Take 3 tablets (150 mg total) by mouth at bedtime. 02/26/16  Yes Gary B Sherrill, MD  amLODipine (NORVASC) 2.5 MG tablet TAKE 1 TABLET (2.5 MG TOTAL) BY MOUTH DAILY. Patient not taking: Reported on 03/11/2016 07/09/15   Donald W Moore, MD  famotidine (PEPCID) 20 MG tablet One at bedtime Patient not taking: Reported on 01/15/2016 08/19/15   Michael B Wert, MD  lidocaine-prilocaine (EMLA) cream Apply to port site one hour prior to use. Do not rub in. Cover with plastic. Patient not taking:   Reported on 04/02/2016 02/26/16   Ladell Pier, MD  LORazepam (ATIVAN) 0.5 MG tablet Take 1 tablet (0.5 mg total) by mouth 3 (three) times daily as needed for anxiety. 02/18/16   Ladell Pier, MD  magic mouthwash SOLN Take 5-10 mLs by mouth 4 (four) times daily as needed for mouth pain. Patient not taking: Reported on 03/11/2016 12/11/15   Ladell Pier, MD  ondansetron (ZOFRAN) 8 MG tablet Take 1 tablet (8 mg total) by mouth every 8 (eight) hours as needed for nausea or vomiting. Patient not taking: Reported on 03/11/2016 01/15/16   Ladell Pier, MD  prochlorperazine (COMPAZINE) 10 MG tablet TAKE 1 TABLET (10 MG TOTAL) BY MOUTH EVERY 6 (SIX) HOURS AS NEEDED FOR NAUSEA OR VOMITING. Patient not taking: Reported on 01/23/2016 01/14/16   Ladell Pier, MD    Family History Family History  Problem Relation Age of Onset  . Colon cancer Mother   . Alzheimer's disease Mother   . Heart failure Father     CHF, lived to be 32  . CAD Brother 67  . Heart disease Maternal Grandfather 10  . Heart disease Maternal Grandmother 64    Social History Social History  Substance Use Topics  . Smoking status: Former Smoker    Packs/day: 0.25    Years: 25.00    Types: Cigarettes    Quit date: 08/31/1976  . Smokeless tobacco: Never Used  . Alcohol use 0.6 oz/week    1 Standard drinks or equivalent per week     Allergies   Macrobid [nitrofurantoin macrocrystal] and Ace inhibitors   Review of Systems Review of Systems  Musculoskeletal: Positive for back pain.   A complete 10 system review of systems was obtained and all systems are negative except as noted in the HPI and PMH.    Physical Exam Updated Vital Signs BP 127/90 (BP Location: Right Arm)   Pulse 99   Temp (!) 104 F (40 C)   Resp 19   Ht 5' 2" (1.575 m)   Wt 110 lb (49.9 kg)   SpO2 92%   BMI 20.12 kg/m   Physical Exam  Constitutional: She is oriented to person, place, and time. She appears well-developed and well-nourished. No distress.  HENT:  Head: Normocephalic and atraumatic.  Right Ear: Hearing normal.  Left Ear: Hearing normal.  Nose: Nose normal.  Mouth/Throat: Oropharynx is clear and moist and mucous membranes are normal.  Eyes: Conjunctivae and EOM are normal. Pupils are equal, round, and reactive to light.  Neck: Normal range of motion. Neck supple.  Cardiovascular: Normal rate, regular rhythm, S1 normal and S2 normal.  Exam reveals no gallop and no friction rub.   No murmur heard. No murmurs heard  Pulmonary/Chest: Effort normal and breath  sounds normal. No respiratory distress. She exhibits no tenderness.  Lungs clear to auscultation   Abdominal: Soft. Normal appearance and bowel sounds are normal. There is no hepatosplenomegaly. There is no tenderness. There is no rebound, no guarding, no tenderness at McBurney's point and negative Murphy's sign. No hernia.  Musculoskeletal: Normal range of motion.  No focal spine tenderness and no step-offs.   Neurological: She is alert and oriented to person, place, and time. She has normal strength. No cranial nerve deficit or sensory deficit. Coordination normal. GCS eye subscore is 4. GCS verbal subscore is 5. GCS motor subscore is 6.  BLE strength 4+/5  Gross sensory exam normal BLE   Skin: Skin is warm,  dry and intact. No rash noted. No cyanosis.  Psychiatric: She has a normal mood and affect. Her speech is normal and behavior is normal. Thought content normal.  Nursing note and vitals reviewed.    ED Treatments / Results  DIAGNOSTIC STUDIES: Oxygen Saturation is 97% on RA, normal by my interpretation.    COORDINATION OF CARE: 12:07 AM- Pt advised of plan for treatment and pt agrees. She will receive CT scan for further evaluation. Informed pt of possible outpatient US in the am.   3:21 AM- Pt informed of CT results.    EKG  EKG Interpretation None       Radiology Ct Abdomen Pelvis W Contrast  Result Date: 04/03/2016 CLINICAL DATA:  LEFT back pain for 2 weeks, bilateral lower extremity weakness and swelling. History of pancreatic and liver cancer, on chemotherapy. EXAM: CT ABDOMEN AND PELVIS WITH CONTRAST TECHNIQUE: Multidetector CT imaging of the abdomen and pelvis was performed using the standard protocol following bolus administration of intravenous contrast. CONTRAST:  80mL ISOVUE-300 IOPAMIDOL (ISOVUE-300) INJECTION 61% COMPARISON:  CT abdomen and pelvis March 10, 2016 FINDINGS: LUNG BASES: New small bilateral pleural effusions. Heart size is normal, no pericardial  effusions. LEFT lower lobe and lingular atelectasis. SOLID ORGANS: Enlarging geographic 4 x 4.9 cm density inferior spleen, with heterogeneous general splenic contrast attenuation. Hepatic capsular retraction and transient hepatic attenuation difference is similar. 11 mm hypodensity RIGHT lobe of the liver, segment 6 was 8 mm. Main portal vein is patent. Gallbladder wall thickening and pericholecystic fluid, unchanged. Atrophic pancreas. Normal adrenal glands. GASTROINTESTINAL TRACT: The stomach, small and large bowel are normal in course and caliber without inflammatory changes. Moderate amount of retained large bowel stool. KIDNEYS/ URINARY TRACT: Kidneys are orthotopic, demonstrating symmetric enhancement. No nephrolithiasis, hydronephrosis or solid renal masses. The unopacified ureters are normal in course and caliber. Delayed imaging through the kidneys demonstrates symmetric prompt contrast excretion within the proximal urinary collecting system. Urinary bladder is well distended and unremarkable. PERITONEUM/RETROPERITONEUM: Aortoiliac vessels are normal in course and caliber, mild calcific atherosclerosis. Again noted is abnormal soft tissue encasing the celiac access extending to LEFT retroperitoneum ; considering contiguity with pancreatic tail, this may represent extension of patient's known neoplasm. 12 mm enhancing nodule the uterine fundus most compatible with leiomyoma. Small amount of ascites, new from prior CT. SOFT TISSUE/OSSEOUS STRUCTURES: Non-suspicious. Broad lower lumbar levoscoliosis. No destructive bony lesions. Moderate to severe L3-4 through L5-S1 degenerative discs with moderate L5-S1 disc protrusion. IMPRESSION: Geographic splenic hypodensity suggests splenic infarct, less likely metastatic disease. Enlarging 11 mm hypodensity RIGHT lobe of the liver concerning for metastatic disease in the setting of known cancer. New small amount of ascites. New small bilateral pleural effusions.  Electronically Signed   By: Courtnay  Bloomer M.D.   On: 04/03/2016 02:31    Procedures Procedures (including critical care time)  Medications Ordered in ED Medications  diatrizoate meglumine-sodium (GASTROGRAFIN) 66-10 % solution 15 mL (15 mLs Oral Given 04/03/16 0112)  HYDROmorphone (DILAUDID) injection 1 mg (1 mg Intravenous Given 04/03/16 0133)  iopamidol (ISOVUE-300) 61 % injection 100 mL (80 mLs Intravenous Contrast Given 04/03/16 0156)     Initial Impression / Assessment and Plan / ED Course  Ankit Nanavati, MD has reviewed the triage vital signs and the nursing notes.  Pertinent labs & imaging results that were available during my care of the patient were reviewed by me and considered in my medical decision making (see chart for details).  Clinical Course        Final Clinical Impressions(s) / ED Diagnoses   Final diagnoses:  Pancreatic carcinoma metastatic to liver (HCC)  Left low back pain, with sciatica presence unspecified  Splenic infarct   I personally performed the services described in this documentation, which was scribed in my presence. The recorded information has been reviewed and is accurate.  Pt with hx of metastatic pancreatic ca with mets to liver comes in with back pain. Pain is below the ribs posteriorly. Pain is not pleuritic. Abd exam is non peritoneal. No splenomegaly. CT ordered - possible Splenic infarct noticed. Also, there is worsening of a liver met. Pt's pain is tolerable. We will d/c her, and she is to see Oncologist next week. Strict ER return precautions have been discussed, and patient is agreeing with the plan and is comfortable with the workup done and the recommendations from the ER. Infection prevention discussed given the possible splenic infarct - which if present would be due to the malignancy leading to hypercoagulability.  Pt has some leg swelling. DVT studies ordered as an outpatient.  New Prescriptions Discharge Medication List as of  04/03/2016  3:37 AM       Varney Biles, MD 04/03/16 2315

## 2016-04-03 ENCOUNTER — Encounter (HOSPITAL_COMMUNITY): Payer: Self-pay

## 2016-04-03 ENCOUNTER — Telehealth: Payer: Self-pay | Admitting: *Deleted

## 2016-04-03 ENCOUNTER — Emergency Department (HOSPITAL_COMMUNITY): Payer: Medicare Other

## 2016-04-03 ENCOUNTER — Inpatient Hospital Stay (HOSPITAL_COMMUNITY): Admission: RE | Admit: 2016-04-03 | Payer: PPO | Source: Ambulatory Visit

## 2016-04-03 ENCOUNTER — Ambulatory Visit: Payer: Medicare Other

## 2016-04-03 DIAGNOSIS — M5442 Lumbago with sciatica, left side: Secondary | ICD-10-CM | POA: Diagnosis not present

## 2016-04-03 MED ORDER — DIATRIZOATE MEGLUMINE & SODIUM 66-10 % PO SOLN
15.0000 mL | Freq: Once | ORAL | Status: AC
Start: 2016-04-03 — End: 2016-04-03
  Administered 2016-04-03: 15 mL via ORAL

## 2016-04-03 MED ORDER — IOPAMIDOL (ISOVUE-300) INJECTION 61%
100.0000 mL | Freq: Once | INTRAVENOUS | Status: AC | PRN
  Administered 2016-04-03: 80 mL via INTRAVENOUS

## 2016-04-03 MED ORDER — HYDROMORPHONE HCL 1 MG/ML IJ SOLN
1.0000 mg | Freq: Once | INTRAMUSCULAR | Status: AC
  Administered 2016-04-03: 1 mg via INTRAVENOUS
  Filled 2016-04-03: qty 1

## 2016-04-03 MED ORDER — HYDROMORPHONE HCL 2 MG PO TABS: 4.0000 mg | ORAL_TABLET | Freq: Once | ORAL | Status: DC | PRN

## 2016-04-03 NOTE — Telephone Encounter (Signed)
Bonnita Nasuti returned call, she reports pt is ambulating without difficulty. No pain in legs. Mild swelling in legs bilaterally. Per Bonnita Nasuti, pt's discharge instructions say to discuss with oncologist the need for dopplers. Bonnita Nasuti reports doppler was ordered before MD evaluation, they were told she may not need doppler done after exam. Instructed her to call office for pain/ difficulty with ambulation or redness/ swelling in legs. She voiced understanding.

## 2016-04-03 NOTE — Telephone Encounter (Signed)
Left message on voicemail for pt to call office. Attempted to reach daughter, Bonnita Nasuti. Voicemail was full. Need to know if pt is having pain/ redness or swelling in her leg.

## 2016-04-03 NOTE — ED Notes (Signed)
NT attempted lab draw and pt. Refused. RN,Taquita made aware and going to obtain lab draw.

## 2016-04-03 NOTE — Discharge Instructions (Signed)
As discussed - the CT scan shows slight enlargement of the liver mass. Also the spleen - which is an organ on the Left side below the ribs, does show infarct - meaning that due to the cancer, there might be compromise to the blood circulation to the spleen, leading to blood not getting to the spleen and causing death of the cells over there. SOMETIMES surgical care is needed, but since your pain is relative control, we think outpatient management is appropriate. Also, if the spleen doesn't work as well, you are at risk for infections - so absolutely ensure you are watchful in avoiding situations where you will be exposed to sick people.  Expect a call tomorrow for ultrasound of the leg. If you dont get it done tomorrow- ask your cancer doctor if you need to get the DVT study done.

## 2016-04-03 NOTE — Telephone Encounter (Signed)
Received telephone message from patient's daughter, Bonnita Nasuti, at [phone].  She stated her mother had to go to the ED last night with complaints of pain.  Her daughter stated the patient was currently on her way to [facility] to leave for the beach.  She was calling for the CT report and requested Dr. Benay Spice contact them with the results. This RN spoke with Dr. Benay Spice regarding the above.  MD had not been notified of the patient's ED visit and was unaware of the CT results. MD viewed the CT report and stated he did not see any  reason for the patient to not go on her beach trip.  He requested the patient call if any increased pain or other concerns. This RN contacted the patient's daughter, Bonnita Nasuti, with Dr. Gearldine Shown response noted above.  This RN informed Bonnita Nasuti that her mother should follow the discharge instructions she received in the ED as well.

## 2016-04-06 ENCOUNTER — Ambulatory Visit: Payer: Medicare Other | Admitting: Nurse Practitioner

## 2016-04-06 ENCOUNTER — Encounter (HOSPITAL_COMMUNITY): Payer: PPO

## 2016-04-06 ENCOUNTER — Ambulatory Visit: Payer: Medicare Other

## 2016-04-06 ENCOUNTER — Other Ambulatory Visit: Payer: Medicare Other

## 2016-04-06 ENCOUNTER — Encounter: Payer: Self-pay | Admitting: Oncology

## 2016-04-07 ENCOUNTER — Other Ambulatory Visit: Payer: Medicare Other

## 2016-04-07 ENCOUNTER — Ambulatory Visit: Payer: Medicare Other

## 2016-04-07 ENCOUNTER — Ambulatory Visit: Payer: Medicare Other | Admitting: Nurse Practitioner

## 2016-04-08 ENCOUNTER — Ambulatory Visit: Payer: Medicare Other

## 2016-04-09 ENCOUNTER — Other Ambulatory Visit (HOSPITAL_BASED_OUTPATIENT_CLINIC_OR_DEPARTMENT_OTHER): Payer: Medicare Other

## 2016-04-09 ENCOUNTER — Encounter: Payer: Medicare Other | Admitting: Genetic Counselor

## 2016-04-09 ENCOUNTER — Ambulatory Visit (HOSPITAL_BASED_OUTPATIENT_CLINIC_OR_DEPARTMENT_OTHER): Payer: Medicare Other

## 2016-04-09 ENCOUNTER — Other Ambulatory Visit: Payer: Medicare Other

## 2016-04-09 ENCOUNTER — Telehealth: Payer: Self-pay | Admitting: Oncology

## 2016-04-09 ENCOUNTER — Ambulatory Visit: Payer: Medicare Other

## 2016-04-09 ENCOUNTER — Ambulatory Visit (HOSPITAL_BASED_OUTPATIENT_CLINIC_OR_DEPARTMENT_OTHER): Payer: Medicare Other | Admitting: Nurse Practitioner

## 2016-04-09 VITALS — BP 137/64 | HR 90 | Temp 98.1°F | Resp 18 | Ht 62.0 in | Wt 116.9 lb

## 2016-04-09 DIAGNOSIS — C258 Malignant neoplasm of overlapping sites of pancreas: Secondary | ICD-10-CM

## 2016-04-09 DIAGNOSIS — Z5111 Encounter for antineoplastic chemotherapy: Secondary | ICD-10-CM | POA: Diagnosis present

## 2016-04-09 DIAGNOSIS — Z95828 Presence of other vascular implants and grafts: Secondary | ICD-10-CM

## 2016-04-09 DIAGNOSIS — Z452 Encounter for adjustment and management of vascular access device: Secondary | ICD-10-CM | POA: Diagnosis present

## 2016-04-09 DIAGNOSIS — C252 Malignant neoplasm of tail of pancreas: Secondary | ICD-10-CM

## 2016-04-09 DIAGNOSIS — C787 Secondary malignant neoplasm of liver and intrahepatic bile duct: Secondary | ICD-10-CM | POA: Diagnosis not present

## 2016-04-09 LAB — COMPREHENSIVE METABOLIC PANEL
ALBUMIN: 2.5 g/dL — AB (ref 3.5–5.0)
ALK PHOS: 210 U/L — AB (ref 40–150)
ALT: 20 U/L (ref 0–55)
ANION GAP: 7 meq/L (ref 3–11)
AST: 25 U/L (ref 5–34)
BILIRUBIN TOTAL: 0.37 mg/dL (ref 0.20–1.20)
BUN: 8.3 mg/dL (ref 7.0–26.0)
CALCIUM: 8.9 mg/dL (ref 8.4–10.4)
CO2: 26 mEq/L (ref 22–29)
Chloride: 107 mEq/L (ref 98–109)
Creatinine: 0.6 mg/dL (ref 0.6–1.1)
Glucose: 114 mg/dl (ref 70–140)
Potassium: 3.9 mEq/L (ref 3.5–5.1)
Sodium: 139 mEq/L (ref 136–145)
TOTAL PROTEIN: 5.8 g/dL — AB (ref 6.4–8.3)

## 2016-04-09 LAB — CBC WITH DIFFERENTIAL/PLATELET
BASO%: 0.5 % (ref 0.0–2.0)
Basophils Absolute: 0 10*3/uL (ref 0.0–0.1)
EOS ABS: 0.3 10*3/uL (ref 0.0–0.5)
EOS%: 4 % (ref 0.0–7.0)
HCT: 28.8 % — ABNORMAL LOW (ref 34.8–46.6)
HEMOGLOBIN: 9 g/dL — AB (ref 11.6–15.9)
LYMPH%: 18.3 % (ref 14.0–49.7)
MCH: 30.6 pg (ref 25.1–34.0)
MCHC: 31.3 g/dL — ABNORMAL LOW (ref 31.5–36.0)
MCV: 98 fL (ref 79.5–101.0)
MONO#: 0.7 10*3/uL (ref 0.1–0.9)
MONO%: 11.3 % (ref 0.0–14.0)
NEUT%: 65.9 % (ref 38.4–76.8)
NEUTROS ABS: 4.1 10*3/uL (ref 1.5–6.5)
PLATELETS: 148 10*3/uL (ref 145–400)
RBC: 2.94 10*6/uL — ABNORMAL LOW (ref 3.70–5.45)
RDW: 17.7 % — ABNORMAL HIGH (ref 11.2–14.5)
WBC: 6.2 10*3/uL (ref 3.9–10.3)
lymph#: 1.1 10*3/uL (ref 0.9–3.3)

## 2016-04-09 MED ORDER — PALONOSETRON HCL INJECTION 0.25 MG/5ML
INTRAVENOUS | Status: AC
  Filled 2016-04-09: qty 5

## 2016-04-09 MED ORDER — LEUCOVORIN CALCIUM INJECTION 350 MG
400.0000 mg/m2 | Freq: Once | INTRAVENOUS | Status: AC
  Administered 2016-04-09: 588 mg via INTRAVENOUS
  Filled 2016-04-09: qty 29.4

## 2016-04-09 MED ORDER — HYDROMORPHONE HCL 4 MG PO TABS: 4.0000 mg | ORAL_TABLET | ORAL | 0 refills | Status: DC | PRN

## 2016-04-09 MED ORDER — SODIUM CHLORIDE 0.9 % IV SOLN
1920.0000 mg/m2 | INTRAVENOUS | Status: DC
  Administered 2016-04-09: 2800 mg via INTRAVENOUS
  Filled 2016-04-09: qty 56

## 2016-04-09 MED ORDER — SODIUM CHLORIDE 0.9 % IV SOLN
Freq: Once | INTRAVENOUS | Status: AC
  Administered 2016-04-09: 12:00:00 via INTRAVENOUS

## 2016-04-09 MED ORDER — ATROPINE SULFATE 1 MG/ML IJ SOLN
0.5000 mg | Freq: Once | INTRAMUSCULAR | Status: AC | PRN
  Administered 2016-04-09: 0.5 mg via INTRAVENOUS

## 2016-04-09 MED ORDER — PALONOSETRON HCL INJECTION 0.25 MG/5ML
0.2500 mg | Freq: Once | INTRAVENOUS | Status: AC
  Administered 2016-04-09: 0.25 mg via INTRAVENOUS

## 2016-04-09 MED ORDER — SODIUM CHLORIDE 0.9 % IV SOLN
Freq: Once | INTRAVENOUS | Status: AC
  Administered 2016-04-09: 12:00:00 via INTRAVENOUS
  Filled 2016-04-09: qty 5

## 2016-04-09 MED ORDER — ATROPINE SULFATE 1 MG/ML IJ SOLN
INTRAMUSCULAR | Status: AC
  Filled 2016-04-09: qty 1

## 2016-04-09 MED ORDER — SODIUM CHLORIDE 0.9 % IJ SOLN
10.0000 mL | INTRAMUSCULAR | Status: DC | PRN
  Administered 2016-04-09: 10 mL via INTRAVENOUS
  Filled 2016-04-09: qty 10

## 2016-04-09 MED ORDER — IRINOTECAN HCL CHEMO INJECTION 100 MG/5ML
150.0000 mg/m2 | Freq: Once | INTRAVENOUS | Status: AC
  Administered 2016-04-09: 220 mg via INTRAVENOUS
  Filled 2016-04-09: qty 5

## 2016-04-09 NOTE — Patient Instructions (Signed)
[facility] Discharge Instructions for Patients Receiving Chemotherapy  Today you received the following chemotherapy agents Irinotecan, Leucovorin, Adrucil  To help prevent nausea and vomiting after your treatment, we encourage you to take your nausea medication. No Zofran for 3 days, take Compazine for nausea instead.   If you develop nausea and vomiting that is not controlled by your nausea medication, call the clinic.   BELOW ARE SYMPTOMS THAT SHOULD BE REPORTED IMMEDIATELY:  *FEVER GREATER THAN 100.5 F  *CHILLS WITH OR WITHOUT FEVER  NAUSEA AND VOMITING THAT IS NOT CONTROLLED WITH YOUR NAUSEA MEDICATION  *UNUSUAL SHORTNESS OF BREATH  *UNUSUAL BRUISING OR BLEEDING  TENDERNESS IN MOUTH AND THROAT WITH OR WITHOUT PRESENCE OF ULCERS  *URINARY PROBLEMS  *BOWEL PROBLEMS  UNUSUAL RASH Items with * indicate a potential emergency and should be followed up as soon as possible.  Feel free to call the clinic you have any questions or concerns. The clinic phone number is [phone].  Please show the [facility] at check-in to the Emergency Department and triage nurse.

## 2016-04-09 NOTE — Patient Instructions (Signed)

## 2016-04-09 NOTE — Telephone Encounter (Signed)
per pof to sh pt appt-gave p copy of avs

## 2016-04-09 NOTE — Progress Notes (Addendum)
[facility] OFFICE PROGRESS NOTE   Diagnosis:  Pancreas cancer  INTERVAL HISTORY:   Ms. Wendy Palmer returns as scheduled. She completed a cycle of FOLFIRI 03/11/2016. She was seen in the emergency department for evaluation of back pain on 04/03/2016. CT scan showed new small bilateral pleural effusions, possible splenic infarct, enlarging 11 mm hypodensity right lobe of the liver, new small amount of ascites.  She continues to have left sided back pain. She is taking Dilaudid. The pain has been progressively worsening over the past 2 weeks. However, overall she is feeling stronger. She has increased her activity. She has a good appetite. She is gaining weight. Taste alteration has improved. She continues to have mild dyspnea on exertion. No chest pain. She had recent leg swelling. The leg swelling has improved. She denies calf pain.  Objective:  Vital signs in last 24 hours:  Blood pressure 137/64, pulse 90, temperature 98.1 F (36.7 C), temperature source Oral, resp. rate 18, height 5\' 2"  (1.575 m), weight 116 lb 14.4 oz (53 kg), SpO2 95 %.    HEENT: No thrush or ulcers. Resp: Lungs clear bilaterally. Cardio: Regular rate and rhythm. GI: Abdomen soft and nontender. No hepatomegaly. No mass. No splenomegaly. Vascular: No significant leg edema. Calves are soft and nontender. The left lower leg appears mildly larger than the right lower leg. Skin: No rash. Musculoskeletal: Left flank region with an area of soft, mobile fullness which appears consistent with a lipoma.  Port-A-Cath without erythema.  Lab Results:  Lab Results  Component Value Date   WBC 6.2 04/09/2016   HGB 9.0 (L) 04/09/2016   HCT 28.8 (L) 04/09/2016   MCV 98.0 04/09/2016   PLT 148 04/09/2016   NEUTROABS 4.1 04/09/2016    Imaging:  No results found.  Medications: I have reviewed the patient's current medications.  Assessment/Plan: 1. Pancreas cancer  Foundation 1 testing on  peripheral blood 02/12/2016-no alteration detected ? MRI abdomen 08/27/2015 revealed a pancreas mass, adenopathy, and liver metastases ? CTs 09/10/2015 with pancreas body/tail mass, liver metastases, lymphadenopathy, innumerable tiny pulmonary nodules ? Ultrasound-guided biopsy of a right liver lesion 09/10/2015 confirmed adenocarcinoma ? Enrollment on the SWOG S1313 study, randomized to the standard FOLFIRINOX arm ? Cycle 1 FOLFIRINOX 09/25/2015 ? Cycle 2 FOLFIRINOX 10/09/2015 ? Cycle 3 FOLFIRINOX 10/23/2015 ? Cycle 4 FOLFIRINOX 11/05/2015  CT 11/19/2015- decrease in the size of liver lesions and the pancreas mass, ? Increased upper abdominal/retroperitoneal adenopath  Cycle 5 FOLFIRINOX 11/20/2015  Cycle 6 FOLFIRINOX 12/04/2015  Cycle 7 FOLFIRINOX 12/18/2015  Cycle 8 FOLFIRINOX 01/01/2016  Restaging CTs 01/14/2016-decrease in size of liver lesions and pancreas mass, stable to decreased upper abdominal lymphadenopathy, no evidence of disease progression  Cycle 9 FOLFIRINOX 01/15/2016  Cycle 10 FOLFIRINOX 02/12/2016 ( chemotherapy dose reduced per protocol based on toxicity)  Cycle 11 FOLFIRINOX 02/26/2016  Restaging CTs 03/10/2016 showed further decrease in the size of a pancreatic tail mass. Decrease in hepatic metastases. New 3 mm right upper lobe pulmonary nodule possibly infectious/inflammatory. New 6 mm hypodensity in the posterior right liver. No substantial change in abnormal soft tissue encasing the celiac axis and extending along the left periaortic retroperitoneal space. Stable 7 mm gastrohepatic ligament lymph node.  Treatment changed to FOLFIRI on a 3 week schedule beginning 03/11/2016  CT scan abdomen/pelvis and in the emergency department 04/03/2016 with new small bilateral effusions, new small amount of ascites, enlarging 11 mm hypodensity right lobe of the liver, geographic splenic hypodensity.  FOLFIRI 04/09/2016  2. Pain secondary to #1, improved  3.  History of constipation-likely secondary to narcotics  4. Mild oxaliplatin neuropathy-mild interference with activity  5.History of mucositis secondary to chemotherapy  6. Depression-grade 1, not interfering with ADLs  7.  Left mid to low back pain/tenderness overlying an area of soft, mobile fullness.   Disposition: Wendy Palmer appears stable. She is currently on active treatment with FOLFIRI every 3 weeks. She overall appears improved. She was seen in the emergency department recently for evaluation of left lower back pain. A CT scan was done which Dr. Benay Spice has reviewed. We reviewed the images with Ms. Monahan and her family in the office today as well. There is no clear evidence of disease progression. The liver lesion is slightly increased in size but the liver continues to show marked improvement overall. The area of hypodensity in the spleen may be perfusion related. There is no clear explanation for the back pain. The area of soft fullness on the left back where she is hurting feels consistent with a lipoma. However, she is requiring narcotics. If the pain persists and/or worsens we will decide on further radiographic evaluation. She will try a nonsteroidal anti-inflammatory or Tylenol.  The plan is to continue treatment on the FOLFIRI regimen every 3 weeks. She will receive a cycle today. We scheduled a return visit and treatment in 3 weeks. She will contact the office in the interim with further problems.  Patient seen with Dr. Benay Spice. 25 minutes were spent face-to-face at today's visit with the majority of that time involved in counseling/coordination of care.  Ned Card ANP/GNP-BC   04/09/2016  10:29 AM  This was a shared visit with Ned Card. The etiology of the left flank discomfort is unclear. I doubt this is related to the area of altered perfusion in the spleen. We will consider additional imaging if the pain persists.  I reviewed the most recent CT in  radiology. The area of hypoattenuation the spleen is most likely related to variable perfusion in the setting of splenic artery involvement by tumor.  The plan is to continue FOLFIRI chemotherapy in the absence of clear evidence of disease progression.  Ms. Spare will return for an office visit and chemotherapy in 3 weeks.  Julieanne Manson, M.D.

## 2016-04-11 ENCOUNTER — Ambulatory Visit: Payer: Medicare Other

## 2016-04-11 ENCOUNTER — Ambulatory Visit (HOSPITAL_BASED_OUTPATIENT_CLINIC_OR_DEPARTMENT_OTHER): Payer: Medicare Other

## 2016-04-11 DIAGNOSIS — C258 Malignant neoplasm of overlapping sites of pancreas: Secondary | ICD-10-CM

## 2016-04-11 DIAGNOSIS — C252 Malignant neoplasm of tail of pancreas: Secondary | ICD-10-CM

## 2016-04-11 MED ORDER — HEPARIN SOD (PORK) LOCK FLUSH 100 UNIT/ML IV SOLN
500.0000 [IU] | Freq: Once | INTRAVENOUS | Status: AC | PRN
  Administered 2016-04-11: 500 [IU]
  Filled 2016-04-11: qty 5

## 2016-04-11 MED ORDER — SODIUM CHLORIDE 0.9% FLUSH
10.0000 mL | INTRAVENOUS | Status: DC | PRN
  Administered 2016-04-11: 10 mL
  Filled 2016-04-11: qty 10

## 2016-04-11 MED ORDER — PEGFILGRASTIM INJECTION 6 MG/0.6ML ~~LOC~~
6.0000 mg | PREFILLED_SYRINGE | Freq: Once | SUBCUTANEOUS | Status: AC
  Administered 2016-04-11: 6 mg via SUBCUTANEOUS

## 2016-04-11 NOTE — Patient Instructions (Signed)

## 2016-04-13 ENCOUNTER — Other Ambulatory Visit: Payer: Medicare Other

## 2016-04-13 ENCOUNTER — Encounter: Payer: Medicare Other | Admitting: Genetic Counselor

## 2016-04-20 ENCOUNTER — Ambulatory Visit (HOSPITAL_COMMUNITY)
Admission: RE | Admit: 2016-04-20 | Discharge: 2016-04-20 | Disposition: A | Discharge status: Home / Self Care | Payer: Medicare Other | Source: Ambulatory Visit | Attending: Oncology | Admitting: Oncology

## 2016-04-20 ENCOUNTER — Telehealth: Payer: Self-pay | Admitting: *Deleted

## 2016-04-20 ENCOUNTER — Ambulatory Visit (HOSPITAL_BASED_OUTPATIENT_CLINIC_OR_DEPARTMENT_OTHER): Payer: Medicare Other | Admitting: Oncology

## 2016-04-20 VITALS — BP 126/47 | HR 90 | Temp 98.1°F | Resp 18 | Ht 62.0 in | Wt 105.6 lb

## 2016-04-20 DIAGNOSIS — C252 Malignant neoplasm of tail of pancreas: Secondary | ICD-10-CM | POA: Diagnosis not present

## 2016-04-20 DIAGNOSIS — C258 Malignant neoplasm of overlapping sites of pancreas: Secondary | ICD-10-CM | POA: Diagnosis present

## 2016-04-20 DIAGNOSIS — G620 Drug-induced polyneuropathy: Secondary | ICD-10-CM

## 2016-04-20 DIAGNOSIS — C787 Secondary malignant neoplasm of liver and intrahepatic bile duct: Secondary | ICD-10-CM

## 2016-04-20 NOTE — Progress Notes (Signed)
[place] OFFICE PROGRESS NOTE   Diagnosis: Pancreas cancer  INTERVAL HISTORY:   Wendy Palmer returns prior to a scheduled visit. She completed another cycle of FOLFIRI on 04/09/2016. No nausea/vomiting or diarrhea following chemotherapy. She reports a good appetite and energy level. She continues to have "throbbing" pain at the left anterolateral chest the costal margin. The pain is worse in the evening and at night. She takes Dilaudid to relieve the pain. No pleurisy.  Objective:  Vital signs in last 24 hours:  Blood pressure (!) 126/47, pulse 90, temperature 98.1 F (36.7 C), temperature source Oral, resp. rate 18, height 5\' 2"  (1.575 m), weight 105 lb 9.6 oz (47.9 kg), SpO2 100 %.    Resp: Lungs clear bilaterally Cardio: Regular rate and rhythm GI: No hepatosplenomegaly, no mass Vascular: No leg edema Musculoskeletal: The area of discomfort as at the left low anterolateral chest wall extending toward the left lateral and posterior iliac. No rash or mass. No tenderness.     Lab Results:  Lab Results  Component Value Date   WBC 6.2 04/09/2016   HGB 9.0 (L) 04/09/2016   HCT 28.8 (L) 04/09/2016   MCV 98.0 04/09/2016   PLT 148 04/09/2016   NEUTROABS 4.1 04/09/2016     Medications: I have reviewed the patient's current medications.  Assessment/Plan: 1. Pancreas cancer  Foundation 1 testing on peripheral blood 02/12/2016-no alteration detected ? MRI abdomen 08/27/2015 revealed a pancreas mass, adenopathy, and liver metastases ? CTs 09/10/2015 with pancreas body/tail mass, liver metastases, lymphadenopathy, innumerable tiny pulmonary nodules ? Ultrasound-guided biopsy of a right liver lesion 09/10/2015 confirmed adenocarcinoma ? Enrollment on the SWOG S1313 study, randomized to the standard FOLFIRINOX arm ? Cycle 1 FOLFIRINOX 09/25/2015 ? Cycle 2 FOLFIRINOX 10/09/2015 ? Cycle 3 FOLFIRINOX 10/23/2015 ? Cycle 4 FOLFIRINOX 11/05/2015  CT  11/19/2015-decrease in the size of liver lesions and the pancreas mass, ? Increased upper abdominal/retroperitoneal adenopath  Cycle 5 FOLFIRINOX 11/20/2015  Cycle 6 FOLFIRINOX 12/04/2015  Cycle 7 FOLFIRINOX 12/18/2015  Cycle 8 FOLFIRINOX 01/01/2016  Restaging CTs 01/14/2016-decrease in size of liver lesions and pancreas mass, stable to decreased upper abdominal lymphadenopathy, no evidence of disease progression  Cycle 9 FOLFIRINOX 01/15/2016  Cycle 10 FOLFIRINOX 02/12/2016 ( chemotherapy dose reduced per protocol based on toxicity)  Cycle 11 FOLFIRINOX 02/26/2016  Restaging CTs 03/10/2016 showed further decrease in the size of a pancreatic tail mass. Decrease in hepatic metastases. New 3 mm right upper lobe pulmonary nodule possibly infectious/inflammatory. New 6 mm hypodensity in the posterior right liver. No substantial change in abnormal soft tissue encasing the celiac axis and extending along the left periaortic retroperitoneal space. Stable 7 mm gastrohepatic ligament lymph node.  Treatment changed to FOLFIRI on a 3 week schedule beginning 03/11/2016  CT scan abdomen/pelvis and in the emergency department 04/03/2016 with new small bilateral effusions, new small amount of ascites, enlarging 11 mm hypodensity right lobe of the liver, geographic splenic hypodensity.  FOLFIRI 04/09/2016  2. Pain secondary to #1, improved  3. History of constipation-likely secondary to narcotics  4. Mild oxaliplatin neuropathy-mild interference with activity  5.History of mucositis secondary to chemotherapy  6. Depression-grade 1, not interfering with ADLs  7.  Left anterolateral chest discomfort-etiology unclear.     Disposition:  She has persistent pain near the left costal margin extending laterally and posteriorly. The etiology of this pain is unclear. I doubt the pain is related to the area of altered perfusion in the spleen. The pain may be  related to a malignant  left pleural effusion or unrecognized metastatic disease involving the spine. We will check a chest x-ray today and proceed with a diagnostic thoracentesis if there is a significant pleural effusion.  She will return as scheduled 04/30/2016. We will see her sooner for increased pain. We will consider a bone scan or PET scan if the pain persists and remains unexplained.  Betsy Coder, MD  04/20/2016  2:48 PM

## 2016-04-20 NOTE — Telephone Encounter (Signed)
Call received from pt stating that she is having "more intense pain to left side below ribs over the weekend" and would like to be seen today.  Dr. Benay Spice notified and order received for pt to come in today to be seen.  Pt notified and will be here at 2:00PM.

## 2016-04-20 NOTE — Telephone Encounter (Signed)
-----   Message from Ladell Pier, MD sent at 04/20/2016  5:07 PM EDT ----- Please call patient, cxr negative for fluid, f/u as scheduled, call for worsening pain and we will order PET

## 2016-04-20 NOTE — Telephone Encounter (Signed)
Per Dr. Benay Spice, pt notified that cxr negative for fluid, to f/u as scheduled and to call for worsening pain and we will order PET.  Pt appreciative of call and has no questions at this time.

## 2016-04-26 ENCOUNTER — Other Ambulatory Visit: Payer: Self-pay | Admitting: Oncology

## 2016-04-30 ENCOUNTER — Ambulatory Visit: Payer: Medicare Other

## 2016-04-30 ENCOUNTER — Encounter: Payer: Self-pay | Admitting: *Deleted

## 2016-04-30 ENCOUNTER — Ambulatory Visit (HOSPITAL_BASED_OUTPATIENT_CLINIC_OR_DEPARTMENT_OTHER): Payer: Medicare Other | Admitting: Oncology

## 2016-04-30 ENCOUNTER — Other Ambulatory Visit (HOSPITAL_BASED_OUTPATIENT_CLINIC_OR_DEPARTMENT_OTHER): Payer: Medicare Other

## 2016-04-30 ENCOUNTER — Ambulatory Visit (HOSPITAL_BASED_OUTPATIENT_CLINIC_OR_DEPARTMENT_OTHER): Payer: Medicare Other

## 2016-04-30 DIAGNOSIS — C252 Malignant neoplasm of tail of pancreas: Secondary | ICD-10-CM

## 2016-04-30 DIAGNOSIS — Z5111 Encounter for antineoplastic chemotherapy: Secondary | ICD-10-CM | POA: Diagnosis present

## 2016-04-30 DIAGNOSIS — R079 Chest pain, unspecified: Secondary | ICD-10-CM

## 2016-04-30 DIAGNOSIS — R1012 Left upper quadrant pain: Secondary | ICD-10-CM | POA: Diagnosis not present

## 2016-04-30 DIAGNOSIS — C787 Secondary malignant neoplasm of liver and intrahepatic bile duct: Secondary | ICD-10-CM | POA: Diagnosis not present

## 2016-04-30 LAB — CBC WITH DIFFERENTIAL/PLATELET
BASO%: 1.4 % (ref 0.0–2.0)
BASOS ABS: 0.1 10*3/uL (ref 0.0–0.1)
EOS ABS: 0.4 10*3/uL (ref 0.0–0.5)
EOS%: 5.4 % (ref 0.0–7.0)
HCT: 29.9 % — ABNORMAL LOW (ref 34.8–46.6)
HEMOGLOBIN: 9.5 g/dL — AB (ref 11.6–15.9)
LYMPH%: 26.3 % (ref 14.0–49.7)
MCH: 30.5 pg (ref 25.1–34.0)
MCHC: 31.9 g/dL (ref 31.5–36.0)
MCV: 95.5 fL (ref 79.5–101.0)
MONO#: 0.6 10*3/uL (ref 0.1–0.9)
MONO%: 8.5 % (ref 0.0–14.0)
NEUT#: 4.2 10*3/uL (ref 1.5–6.5)
NEUT%: 58.4 % (ref 38.4–76.8)
Platelets: 190 10*3/uL (ref 145–400)
RBC: 3.13 10*6/uL — AB (ref 3.70–5.45)
RDW: 17.7 % — ABNORMAL HIGH (ref 11.2–14.5)
WBC: 7.3 10*3/uL (ref 3.9–10.3)
lymph#: 1.9 10*3/uL (ref 0.9–3.3)

## 2016-04-30 LAB — COMPREHENSIVE METABOLIC PANEL
ALBUMIN: 2.9 g/dL — AB (ref 3.5–5.0)
ALK PHOS: 212 U/L — AB (ref 40–150)
ALT: 16 U/L (ref 0–55)
AST: 25 U/L (ref 5–34)
Anion Gap: 7 mEq/L (ref 3–11)
BUN: 17.8 mg/dL (ref 7.0–26.0)
CHLORIDE: 107 meq/L (ref 98–109)
CO2: 25 mEq/L (ref 22–29)
Calcium: 8.9 mg/dL (ref 8.4–10.4)
Creatinine: 0.6 mg/dL (ref 0.6–1.1)
GLUCOSE: 98 mg/dL (ref 70–140)
POTASSIUM: 4.1 meq/L (ref 3.5–5.1)
SODIUM: 139 meq/L (ref 136–145)
Total Bilirubin: <0.3 mg/dL (ref 0.20–1.20)
Total Protein: 6.1 g/dL — ABNORMAL LOW (ref 6.4–8.3)

## 2016-04-30 MED ORDER — HYDROMORPHONE HCL 4 MG PO TABS: 4.0000 mg | ORAL_TABLET | ORAL | 0 refills | Status: DC | PRN

## 2016-04-30 MED ORDER — SODIUM CHLORIDE 0.9 % IV SOLN
Freq: Once | INTRAVENOUS | Status: AC
  Administered 2016-04-30: 10:00:00 via INTRAVENOUS
  Filled 2016-04-30: qty 5

## 2016-04-30 MED ORDER — SODIUM CHLORIDE 0.9 % IV SOLN
Freq: Once | INTRAVENOUS | Status: AC
  Administered 2016-04-30: 10:00:00 via INTRAVENOUS

## 2016-04-30 MED ORDER — ATROPINE SULFATE 1 MG/ML IJ SOLN
INTRAMUSCULAR | Status: AC
  Filled 2016-04-30: qty 1

## 2016-04-30 MED ORDER — ATROPINE SULFATE 1 MG/ML IJ SOLN
0.5000 mg | Freq: Once | INTRAMUSCULAR | Status: AC | PRN
  Administered 2016-04-30: 0.5 mg via INTRAVENOUS

## 2016-04-30 MED ORDER — SODIUM CHLORIDE 0.9 % IV SOLN
1920.0000 mg/m2 | INTRAVENOUS | Status: DC
  Administered 2016-04-30: 2800 mg via INTRAVENOUS
  Filled 2016-04-30: qty 56

## 2016-04-30 MED ORDER — PALONOSETRON HCL INJECTION 0.25 MG/5ML
INTRAVENOUS | Status: AC
  Filled 2016-04-30: qty 5

## 2016-04-30 MED ORDER — PALONOSETRON HCL INJECTION 0.25 MG/5ML
0.2500 mg | Freq: Once | INTRAVENOUS | Status: AC
  Administered 2016-04-30: 0.25 mg via INTRAVENOUS

## 2016-04-30 MED ORDER — DEXTROSE 5 % IV SOLN
400.0000 mg/m2 | Freq: Once | INTRAVENOUS | Status: AC
  Administered 2016-04-30: 588 mg via INTRAVENOUS
  Filled 2016-04-30: qty 29.4

## 2016-04-30 MED ORDER — IRINOTECAN HCL CHEMO INJECTION 100 MG/5ML
150.0000 mg/m2 | Freq: Once | INTRAVENOUS | Status: AC
  Administered 2016-04-30: 220 mg via INTRAVENOUS
  Filled 2016-04-30: qty 6

## 2016-04-30 NOTE — Patient Instructions (Signed)
[facility] Discharge Instructions for Patients Receiving Chemotherapy  Today you received the following chemotherapy agents Irinotecan, Leucovorin, Adrucil  To help prevent nausea and vomiting after your treatment, we encourage you to take your nausea medication. No Zofran for 3 days, take Compazine for nausea instead.   If you develop nausea and vomiting that is not controlled by your nausea medication, call the clinic.   BELOW ARE SYMPTOMS THAT SHOULD BE REPORTED IMMEDIATELY:  *FEVER GREATER THAN 100.5 F  *CHILLS WITH OR WITHOUT FEVER  NAUSEA AND VOMITING THAT IS NOT CONTROLLED WITH YOUR NAUSEA MEDICATION  *UNUSUAL SHORTNESS OF BREATH  *UNUSUAL BRUISING OR BLEEDING  TENDERNESS IN MOUTH AND THROAT WITH OR WITHOUT PRESENCE OF ULCERS  *URINARY PROBLEMS  *BOWEL PROBLEMS  UNUSUAL RASH Items with * indicate a potential emergency and should be followed up as soon as possible.  Feel free to call the clinic you have any questions or concerns. The clinic phone number is [phone].  Please show the [facility] at check-in to the Emergency Department and triage nurse.

## 2016-04-30 NOTE — Progress Notes (Signed)
04/30/2016 0910 This RN met with the patient and Dr. Benay Spice during the patient's clinic visit.  The patient stated she wanted to stay on protocol follow-up and would like to continue with protocol CT scan schedule of every 8 weeks.  The next scan is due 05/29/16 and she requested it be done on 05/28/16 because of going out of town.  Dr. Benay Spice was present during the conversation and agreed to follow protocol requirements for scans every 8 weeks until progression.   Marcellus Scott, RN, BSN, MHA, OCN

## 2016-04-30 NOTE — Progress Notes (Signed)
[place] OFFICE PROGRESS NOTE   Diagnosis: Pancreas cancer  INTERVAL HISTORY:   Wendy Palmer returns as scheduled. She continues to have discomfort at the left anterolateral costal margin. She takes Aleve in the morning and Dilaudid in the evening. No significant back pain. Good appetite. She continues to have mild numbness in the fingers and feet  Objective:  Vital signs in last 24 hours:  There were no vitals taken for this visit.    HEENT: No thrush, healing ulcer at the right buccal mucosa Resp: Lungs clear bilaterally Cardio: Regular rate and rhythm GI: No hepatosplenomegaly, no mass, nontender Vascular: No leg edema Musculoskeletal: No tenderness or mass at the left lateral chest wall/costal margin. No rash. Mobile fatty lesion at the left posterior lateral chest wall consistent with a lipoma     Portacath/PICC-without erythema  Lab Results:  Lab Results  Component Value Date   WBC 7.3 04/30/2016   HGB 9.5 (L) 04/30/2016   HCT 29.9 (L) 04/30/2016   MCV 95.5 04/30/2016   PLT 190 04/30/2016   NEUTROABS 4.2 04/30/2016    Medications: I have reviewed the patient's current medications.  Assessment/Plan: 1. Pancreas cancer  Foundation 1 testing on peripheral blood 02/12/2016-no alteration detected ? MRI abdomen 08/27/2015 revealed a pancreas mass, adenopathy, and liver metastases ? CTs 09/10/2015 with pancreas body/tail mass, liver metastases, lymphadenopathy, innumerable tiny pulmonary nodules ? Ultrasound-guided biopsy of a right liver lesion 09/10/2015 confirmed adenocarcinoma ? Enrollment on the SWOG S1313 study, randomized to the standard FOLFIRINOX arm ? Cycle 1 FOLFIRINOX 09/25/2015 ? Cycle 2 FOLFIRINOX 10/09/2015 ? Cycle 3 FOLFIRINOX 10/23/2015 ? Cycle 4 FOLFIRINOX 11/05/2015  CT 11/19/2015-decrease in the size of liver lesions and the pancreas mass, ? Increased upper abdominal/retroperitoneal adenopath  Cycle 5 FOLFIRINOX  11/20/2015  Cycle 6 FOLFIRINOX 12/04/2015  Cycle 7 FOLFIRINOX 12/18/2015  Cycle 8 FOLFIRINOX 01/01/2016  Restaging CTs 01/14/2016-decrease in size of liver lesions and pancreas mass, stable to decreased upper abdominal lymphadenopathy, no evidence of disease progression  Cycle 9 FOLFIRINOX 01/15/2016  Cycle 10 FOLFIRINOX 02/12/2016 ( chemotherapy dose reduced per protocol based on toxicity)  Cycle 11 FOLFIRINOX 02/26/2016  Restaging CTs 03/10/2016 showed further decrease in the size of a pancreatic tail mass. Decrease in hepatic metastases. New 3 mm right upper lobe pulmonary nodule possibly infectious/inflammatory. New 6 mm hypodensity in the posterior right liver. No substantial change in abnormal soft tissue encasing the celiac axis and extending along the left periaortic retroperitoneal space. Stable 7 mm gastrohepatic ligament lymph node.  Treatment changed to FOLFIRI on a 3 week schedule beginning 03/11/2016  CT scan abdomen/pelvis and in the emergency department 04/03/2016 with new small bilateral effusions, new small amount of ascites, enlarging 11 mm hypodensity right lobe of the liver, geographic splenic hypodensity.  FOLFIRI 04/09/2016  FOLFIRI 04/30/2016  2. Pain secondary to #1, improved  3. History of constipation-likely secondary to narcotics  4. Mild oxaliplatin neuropathy-mild interference with activity  5.History of mucositis secondary to chemotherapy  6. Depression-grade 1, not interfering with ADLs  7. Left anterolateral chest discomfort-etiology unclear.    Disposition:  She appears stable. The etiology of the left chest/upper abdominal discomfort remains unclear. The pain is intermittent and has not progressed over the past several weeks. She will continue Aleve and hydromorphone as needed.  The plan is to continue FOLFIRI on a 3 week schedule. She will return for an office visit and FOLFIRI in 3 weeks.  Her ECOG performance status  is measured at  1 today.  Wendy Coder, MD  04/30/2016  9:32 AM

## 2016-05-02 ENCOUNTER — Ambulatory Visit (HOSPITAL_BASED_OUTPATIENT_CLINIC_OR_DEPARTMENT_OTHER): Payer: Medicare Other

## 2016-05-02 ENCOUNTER — Ambulatory Visit: Payer: Medicare Other

## 2016-05-02 VITALS — BP 131/60 | HR 87 | Temp 98.8°F | Resp 16

## 2016-05-02 DIAGNOSIS — C258 Malignant neoplasm of overlapping sites of pancreas: Secondary | ICD-10-CM | POA: Diagnosis present

## 2016-05-02 DIAGNOSIS — C252 Malignant neoplasm of tail of pancreas: Secondary | ICD-10-CM

## 2016-05-02 MED ORDER — PEGFILGRASTIM INJECTION 6 MG/0.6ML ~~LOC~~
6.0000 mg | PREFILLED_SYRINGE | Freq: Once | SUBCUTANEOUS | Status: AC
  Administered 2016-05-02: 6 mg via SUBCUTANEOUS

## 2016-05-02 MED ORDER — SODIUM CHLORIDE 0.9% FLUSH
10.0000 mL | INTRAVENOUS | Status: DC | PRN
  Administered 2016-05-02: 10 mL
  Filled 2016-05-02: qty 10

## 2016-05-02 MED ORDER — HEPARIN SOD (PORK) LOCK FLUSH 100 UNIT/ML IV SOLN
500.0000 [IU] | Freq: Once | INTRAVENOUS | Status: AC | PRN
  Administered 2016-05-02: 500 [IU]
  Filled 2016-05-02: qty 5

## 2016-05-02 NOTE — Progress Notes (Signed)
Charted on injection encounter

## 2016-05-07 ENCOUNTER — Telehealth: Payer: Self-pay | Admitting: *Deleted

## 2016-05-07 NOTE — Telephone Encounter (Signed)
Oncology Nurse Navigator Documentation  Oncology Nurse Navigator Flowsheets 05/07/2016  Navigator Location [facility]  Navigator Encounter Type Telephone  Telephone Incoming Call;Appt Confirmation/Clarification--wanted to clarify what time she comes in on 9/21 and what will be don.  Abnormal Finding Date -  Confirmed Diagnosis Date -  Treatment Initiated Date -  Patient Visit Type -  Treatment Phase -  Barriers/Navigation Needs Coordination of Care--jury duty and CT scan  Education -  Interventions -  Referrals -  Coordination of Care Other--instructed her to drop off her jury duty summons and MD will dictate letter to excuse her. She wants her CT scan on 9/27 and will pick up contrast on 9/21 visit. Message to managed care to obtain PA for scan and navigator will schedule.  Education Method -  Support Groups/Services -  Acuity -  Time Spent with Patient 15

## 2016-05-13 ENCOUNTER — Encounter: Payer: Self-pay | Admitting: Oncology

## 2016-05-13 ENCOUNTER — Telehealth: Payer: Self-pay | Admitting: *Deleted

## 2016-05-13 NOTE — Telephone Encounter (Signed)
Called pt: informed her that jury duty letter is complete. She requested we fax it to [place]. Same done. Copy of letter and jury duty summons will be at RN desk for pt to pick up at next visit.

## 2016-05-14 ENCOUNTER — Telehealth: Payer: Self-pay | Admitting: *Deleted

## 2016-05-14 NOTE — Telephone Encounter (Signed)
05/14/2016 1105 Left voice message with contact information for patient to return call concerning CT scan schedule.

## 2016-05-17 ENCOUNTER — Other Ambulatory Visit: Payer: Self-pay | Admitting: Oncology

## 2016-05-18 ENCOUNTER — Ambulatory Visit (HOSPITAL_COMMUNITY)
Admission: RE | Admit: 2016-05-18 | Discharge: 2016-05-18 | Disposition: A | Discharge status: Home / Self Care | Payer: Medicare Other | Source: Ambulatory Visit | Attending: Oncology | Admitting: Oncology

## 2016-05-18 ENCOUNTER — Encounter (HOSPITAL_COMMUNITY): Payer: Self-pay

## 2016-05-18 DIAGNOSIS — C787 Secondary malignant neoplasm of liver and intrahepatic bile duct: Secondary | ICD-10-CM | POA: Insufficient documentation

## 2016-05-18 DIAGNOSIS — R911 Solitary pulmonary nodule: Secondary | ICD-10-CM | POA: Diagnosis not present

## 2016-05-18 DIAGNOSIS — C252 Malignant neoplasm of tail of pancreas: Secondary | ICD-10-CM | POA: Insufficient documentation

## 2016-05-18 DIAGNOSIS — J90 Pleural effusion, not elsewhere classified: Secondary | ICD-10-CM | POA: Insufficient documentation

## 2016-05-18 DIAGNOSIS — R590 Localized enlarged lymph nodes: Secondary | ICD-10-CM | POA: Insufficient documentation

## 2016-05-18 MED ORDER — IOPAMIDOL (ISOVUE-300) INJECTION 61%
100.0000 mL | Freq: Once | INTRAVENOUS | Status: AC | PRN
  Administered 2016-05-18: 100 mL via INTRAVENOUS

## 2016-05-19 ENCOUNTER — Telehealth: Payer: Self-pay | Admitting: *Deleted

## 2016-05-19 NOTE — Telephone Encounter (Signed)
Call received from [facility] stating that message was left by patient request her CT results.  Patient instructed per Dr. Benay Spice that main mass is smaller, lung nodules are slightly bigger and that Dr. Benay Spice is going to talk with the radiologist in the AM to clear up type-o's on report.  Pt appreciative of call back and has no questions at this time.

## 2016-05-21 ENCOUNTER — Telehealth: Payer: Self-pay

## 2016-05-21 ENCOUNTER — Other Ambulatory Visit: Payer: Medicare Other

## 2016-05-21 ENCOUNTER — Ambulatory Visit: Payer: Medicare Other | Admitting: Nurse Practitioner

## 2016-05-21 ENCOUNTER — Other Ambulatory Visit: Payer: Self-pay | Admitting: *Deleted

## 2016-05-21 ENCOUNTER — Telehealth: Payer: Self-pay | Admitting: Oncology

## 2016-05-21 ENCOUNTER — Ambulatory Visit: Payer: Medicare Other

## 2016-05-21 ENCOUNTER — Ambulatory Visit (HOSPITAL_BASED_OUTPATIENT_CLINIC_OR_DEPARTMENT_OTHER): Payer: Medicare Other | Admitting: Oncology

## 2016-05-21 VITALS — BP 140/62 | HR 87 | Temp 98.0°F | Resp 16 | Ht 62.0 in | Wt 108.1 lb

## 2016-05-21 DIAGNOSIS — C787 Secondary malignant neoplasm of liver and intrahepatic bile duct: Secondary | ICD-10-CM

## 2016-05-21 DIAGNOSIS — C252 Malignant neoplasm of tail of pancreas: Secondary | ICD-10-CM

## 2016-05-21 DIAGNOSIS — Z23 Encounter for immunization: Secondary | ICD-10-CM | POA: Diagnosis present

## 2016-05-21 DIAGNOSIS — Z006 Encounter for examination for normal comparison and control in clinical research program: Secondary | ICD-10-CM

## 2016-05-21 DIAGNOSIS — G893 Neoplasm related pain (acute) (chronic): Secondary | ICD-10-CM

## 2016-05-21 IMAGING — CT CT CHEST W/ CM
2 of 9 series · 13 of 46 positions shown, 15 images · IV contrast (OMNIPAQUE)
Comparison: CT the chest, abdomen and pelvis 09/09/2015.

CLINICAL DATA: 64-year-old female with history of pancreatic
cancer. RECIST protocol.

EXAM:
CT CHEST, ABDOMEN, AND PELVIS WITH CONTRAST
TECHNIQUE: Multidetector CT imaging of the chest, abdomen and pelvis was
performed following the standard protocol during bolus
administration of intravenous contrast.
CONTRAST:  100mL UTGS4P-ITT IOPAMIDOL (UTGS4P-ITT) INJECTION 61%

[Series 7: venous thins pacs · axial · portal-venous · 0.71mm/px · z∈[-534,-30]mm · 10 of 202 slices shown, 12 images]
[im 17/202  soft-tissue]
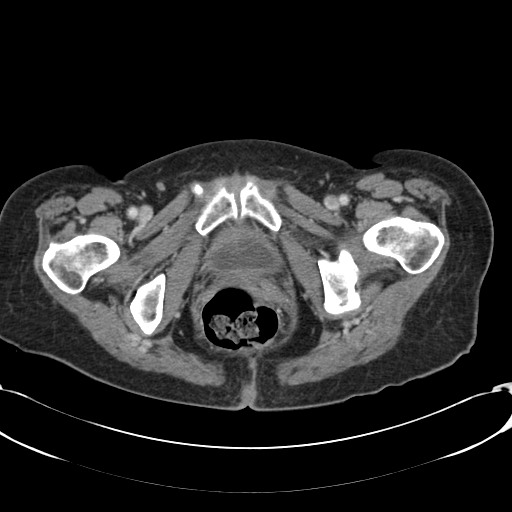
[im 17/202  bone]
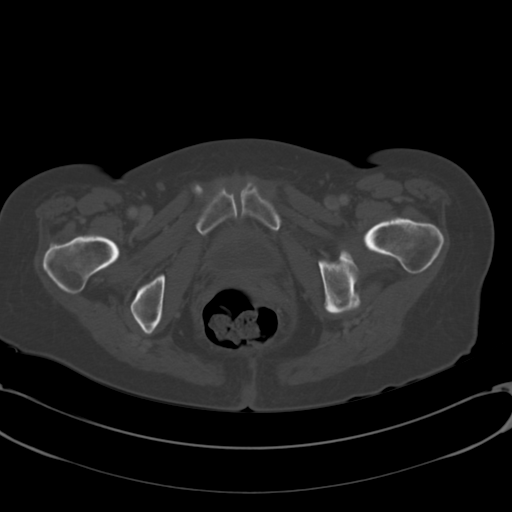
[im 34/202  soft-tissue]
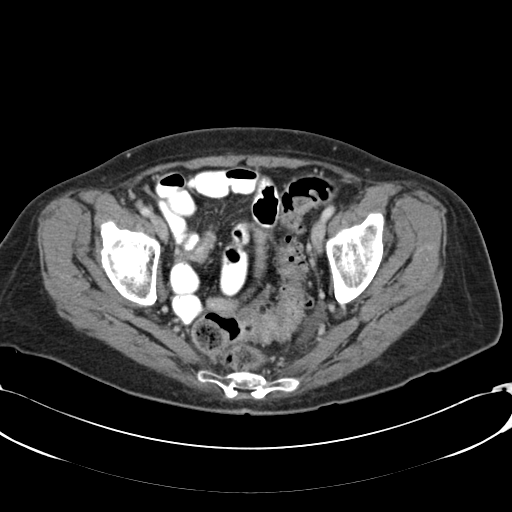
[im 51/202  soft-tissue]
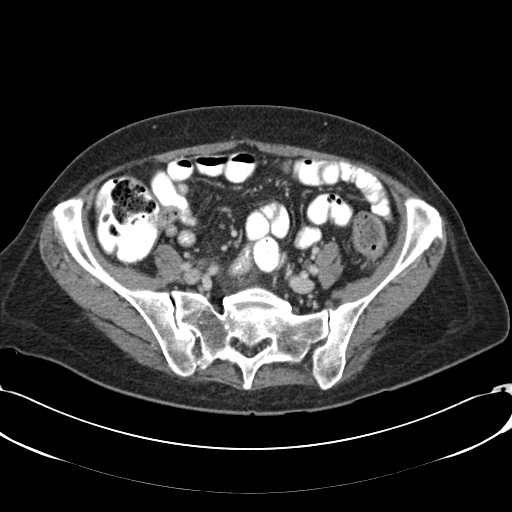
[im 68/202  soft-tissue]
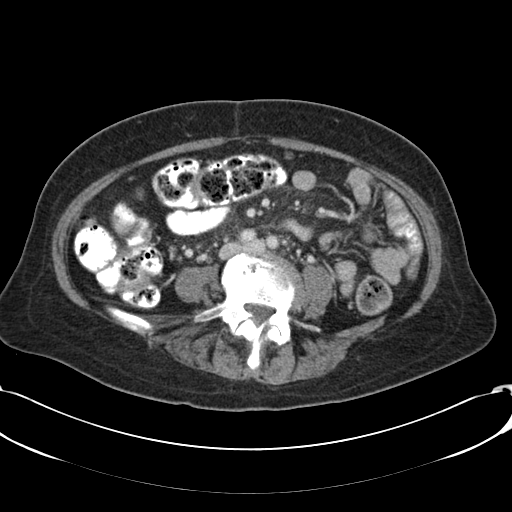
[im 84/202  soft-tissue]
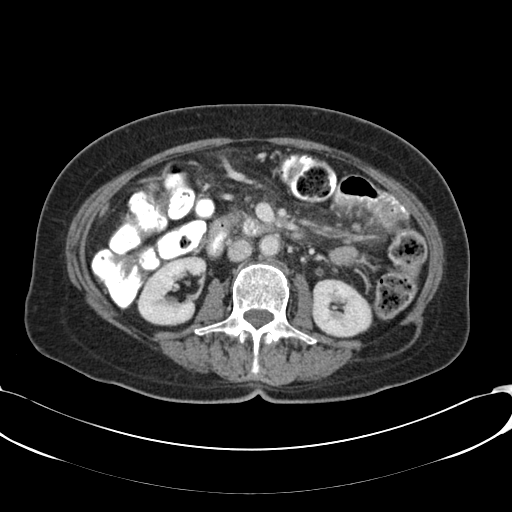
[im 118/202  soft-tissue]
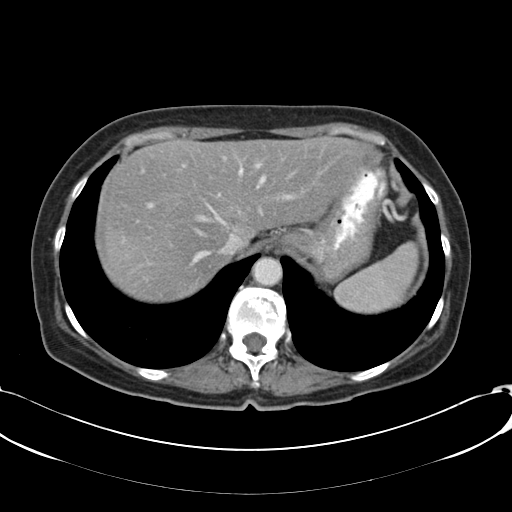
[im 135/202  soft-tissue]
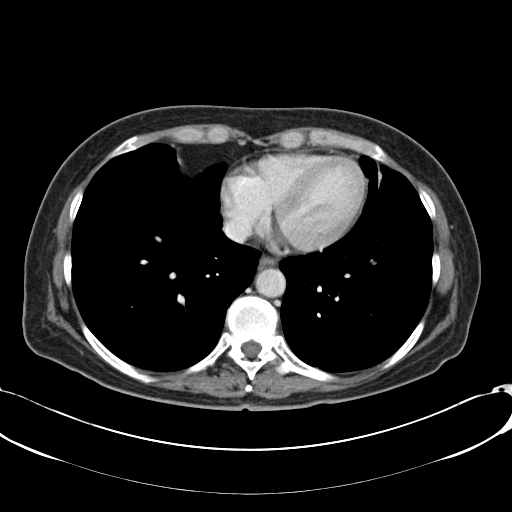
[im 151/202  soft-tissue]
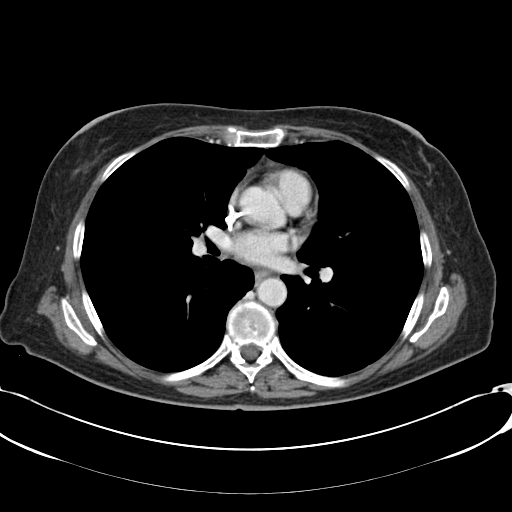
[im 168/202  soft-tissue]
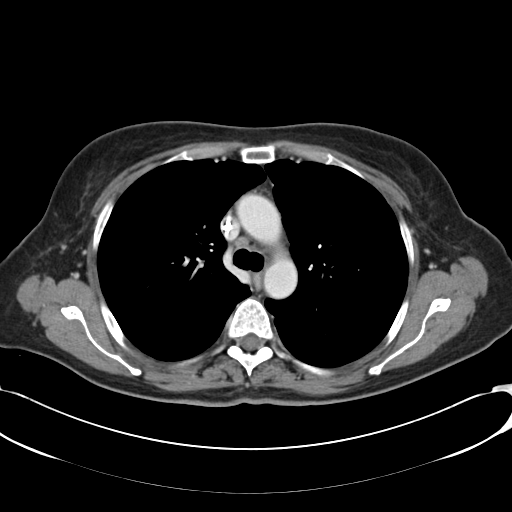
[im 168/202  bone]
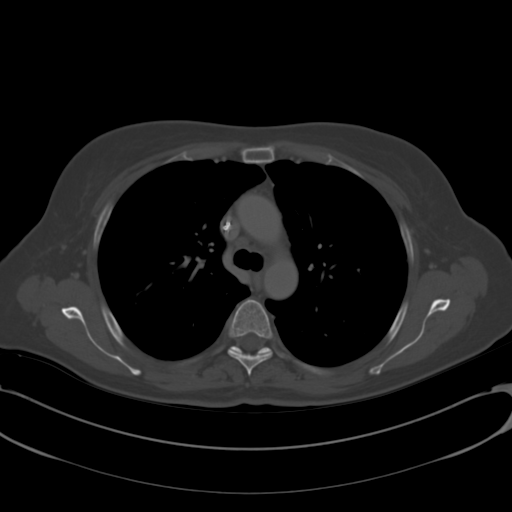
[im 185/202  soft-tissue]
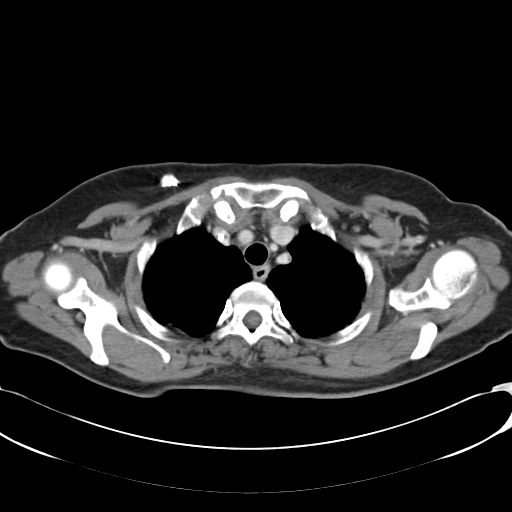

[Series 602: <mpr thick range> · coronal · 0.71mm/px · 3 of 73 slices shown]
[im 19/73  soft-tissue]
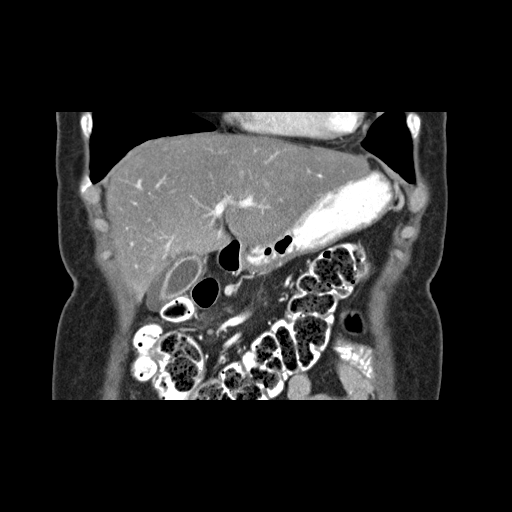
[im 37/73  soft-tissue]
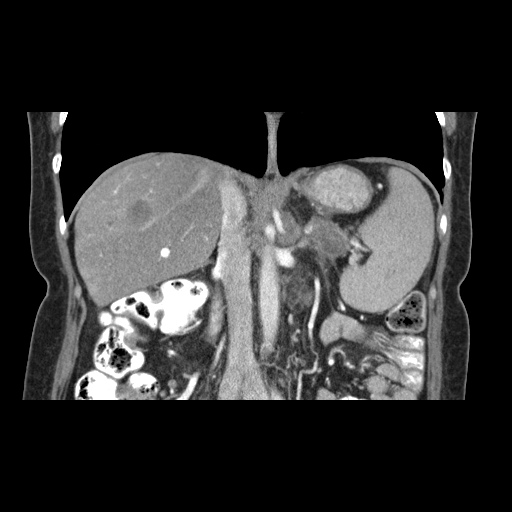
[im 55/73  soft-tissue]
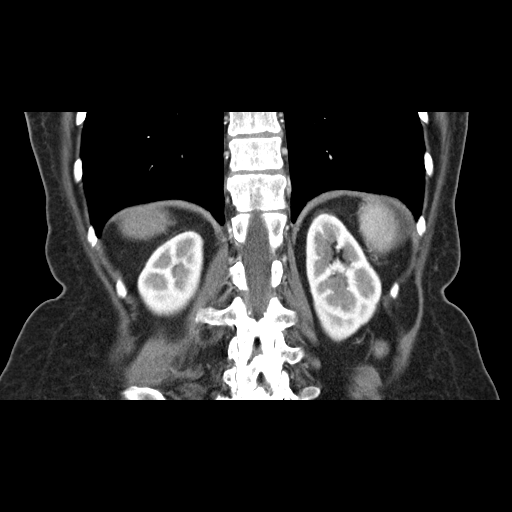

[13 of 46 positions shown; findings below may reference images not displayed]

FINDINGS: RECIST

Target Lesions:

1. Hypovascular mass in the distal body and tail of the pancreas
appears slightly smaller than the prior examination, currently
measuring 2.7 x 5.0 cm (previously 3.2 x 6.2 cm on prior 09/09/2015.
2. Index hypovascular lesion at junction of segments 7 and 8 in the
liver is slightly smaller than the prior study, currently measuring
2.1 x 2.0 cm (image 56 of series 4), previously 2.2 x 2.5 cm.
Non-target Lesions:

1. Worsening upper abdominal and retroperitoneal lymphadenopathy,
most notable for a 1.3 cm short axis left para-aortic lymph node
(image 60 of series [DATE]. Unchanged gastrohepatic ligament lymph node measuring 1 cm in
short axis (image 56 of series 4).
CT CHEST FINDINGS

Mediastinum/Lymph Nodes: Heart size is normal. There is no
significant pericardial fluid, thickening or pericardial
calcification. Borderline enlarged right paratracheal lymph node
measuring 8 mm in short axis is slightly smaller than the prior
study and nonspecific. No pathologically enlarged mediastinal or
hilar lymph nodes. Esophagus is unremarkable in appearance. No
axillary lymphadenopathy. Right internal jugular single-lumen porta
cath with tip terminating in the superior aspect of the right
atrium.

Lungs/Pleura: There again innumerable tiny 2-3 mm pulmonary nodules
scattered throughout the lungs bilaterally, unchanged in size,
number and distribution, presumably benign. No new larger suspicious
appearing pulmonary nodules or masses are otherwise noted. Areas of
cylindrical bronchiectasis, thickening of the peribronchovascular
interstitium and associated architectural distortion with volume
loss in the inferior segment of the lingula and medial segment of
the right middle lobe are again noted, compatible with areas of
chronic post infectious or inflammatory scarring. No acute
consolidative airspace disease. No pleural effusions.

Musculoskeletal/Soft Tissues: There are no aggressive appearing
lytic or blastic lesions noted in the visualized portions of the
skeleton.

CT ABDOMEN AND PELVIS FINDINGS

Hepatobiliary: Previously described index hypovascular lesion at
junction of segments 7 and 8 in the liver is slightly smaller than
the prior study, currently measuring 2.1 x 2.0 cm (image 56 of
series 4), previously 2.2 x 2.5 cm. The other previously noted
hypovascular hepatic lesions are also smaller or have completely
resolved compared to the prior examination. The best example of this
is a lesion that was previously located between segments 4B and 5
(images 58 and 59 of series 2 of prior study 09/09/2015), which is
now completely nonvisualized (image 60 of series 4 of today's exam).
No new hepatic lesions are noted. No intra or extrahepatic biliary
ductal dilatation. Gallbladder wall edema. No definite gallstones.
No definite pericholecystic fluid or inflammatory changes.

Pancreas: Hypovascular mass in the distal body and tail of the
pancreas appears slightly smaller than the prior examination,
currently measuring 2.7 x 5.0 cm (previously 3.2 x 6.2 cm on prior
09/09/2015). This mass is again intimately associated with the
splenic vein which appears chronically occluded. This mass appears
separate from the superior mesenteric vein and superior mesenteric
artery. This is mass extends all the way to the level of the splenic
hilum, without definite direct splenic invasion. The posterior wall
of the mass comes in close proximity to the left adrenal gland,
without definite invasion. The celiac axis and its main branches
appear intimately associated with the superior aspect of the lesion,
where these vessels are encased. At this time, the celiac axis,
splenic vein and common hepatic artery appear patent. The left
gastric artery is not confidently identified.

Spleen: Unremarkable.

Adrenals/Urinary Tract: Bilateral adrenal glands and bilateral
kidneys are normal in appearance. No hydroureteronephrosis. Urinary
bladder is normal in appearance.

Stomach/Bowel: The appearance of the stomach is normal. No
pathologic dilatation of small bowel or colon. A few scattered
colonic diverticulae are noted, particularly in the sigmoid colon,
without definite surrounding inflammatory changes to suggest an
acute diverticulitis at this time. Normal appendix.

Vascular/Lymphatic: Vascular findings pertinent to the known
pancreatic cancer, as discussed above. Atherosclerosis throughout
the abdominal and pelvic vasculature, without definite aneurysm or
dissection. Worsening upper abdominal and retroperitoneal
lymphadenopathy, most notable for a 1.3 cm short axis left
para-aortic lymph node (image 60 of series 4). This left para-aortic
lymphadenopathy is part of multiple enlarged and borderline enlarged
lymph nodes in the upper abdomen, intimately associated with the
celiac axis and its branches, as discussed above. This is best
visualized on images 58-60 of series 4, increased in overall bulk
compared to the prior examination. Mildly enlarged gastrohepatic
ligament lymph node measuring 1 cm in short axis is unchanged (image
56 of series 4). There is also a generalized haziness throughout the
root of the small bowel mesentery, suggesting some lymphatic
congestion. Multiple prominent but nonenlarged mesenteric lymph
nodes are noted, but are nonspecific.

Reproductive: Previously noted enhancing lesion in the uterine
fundus is no longer identified. Uterus and ovaries are unremarkable
in appearance on today's examination.

Other: Trace volume of ascites predominantly in the low anatomic
pelvis. No pneumoperitoneum.

Musculoskeletal: There are no aggressive appearing lytic or blastic
lesions noted in the visualized portions of the skeleton.
IMPRESSION: 1. Today's study demonstrates a mixed response to therapy.
Specifically, while the primary lesion in the distal body and tail
of the pancreas is smaller than the prior study, as are the numerous
hepatic metastases, there is worsening upper abdominal and
retroperitoneal lymphadenopathy, as detailed above.
2. Previously described tiny pulmonary nodules are stable in size
compared to the prior examination, favored to be benign.
3. Trace volume of ascites.
4. Additional incidental findings, as above.

## 2016-05-21 MED ORDER — HYDROMORPHONE HCL 4 MG PO TABS: 4.0000 mg | ORAL_TABLET | ORAL | 0 refills | Status: DC | PRN

## 2016-05-21 MED ORDER — INFLUENZA VAC SPLIT QUAD 0.5 ML IM SUSY
0.5000 mL | PREFILLED_SYRINGE | Freq: Once | INTRAMUSCULAR | Status: AC
  Administered 2016-05-21: 0.5 mL via INTRAMUSCULAR
  Filled 2016-05-21: qty 0.5

## 2016-05-21 MED ORDER — OXYCODONE HCL ER 10 MG PO T12A: 10.0000 mg | EXTENDED_RELEASE_TABLET | Freq: Two times a day (BID) | ORAL | 0 refills | Status: DC

## 2016-05-21 NOTE — Telephone Encounter (Signed)
Called pts daughter Wendy Palmer to see if she was with pt today. Wendy Palmer states she is not. Informed Wendy Palmer of having to r/s all appts for today. States she will try and reach pt.

## 2016-05-21 NOTE — Telephone Encounter (Signed)
Gave patient avs report and appointments for October. Per BS f/u only 10/3 - no lab/tx - patient aware.

## 2016-05-21 NOTE — Telephone Encounter (Signed)
Pt called and spoke with [facility] in triage, states she did not know she was supposed to be in for lab flush and lisa thomas today, only thought she had infusion appt. Pt states she is on her way. Informed Ned Card, NP who will not be able to see her d/t schedule today, Dr. Benay Spice unable to work pt in as well. Per Dr. Benay Spice, pt to reschedule all appts. Called and left message with pt of this information and someone will contact her regarding her CT results and new appt times. Message sent to scheduling for new appts.

## 2016-05-21 NOTE — Progress Notes (Addendum)
[place] OFFICE PROGRESS NOTE   Diagnosis: Pancreas cancer  INTERVAL HISTORY:   Wendy Palmer returns as scheduled. She completed another cycle of FOLFIRI on 04/30/2016. She reports a good appetite and energy level. The pain at the left posterior lateral chest, back, and upper abdomen has become more consistent. She takes Dilaudid in the evening.  Objective:  Vital signs in last 24 hours:  Blood pressure 140/62, pulse 87, temperature 98 F (36.7 C), temperature source Oral, resp. rate 16, height 5\' 2"  (1.575 m), weight 108 lb 1.6 oz (49 kg), SpO2 99 %.   Resp: Lungs clear bilaterally Cardio: Regular rate and rhythm GI: No hepatosplenomegaly, no mass, no apparent ascites, nontender Vascular: No leg edema Musculoskeletal: No tenderness at the left posterior lateral chest wall, no mass    Portacath/PICC-without erythema  Lab Results:  Lab Results  Component Value Date   WBC 7.3 04/30/2016   HGB 9.5 (L) 04/30/2016   HCT 29.9 (L) 04/30/2016   MCV 95.5 04/30/2016   PLT 190 04/30/2016   NEUTROABS 4.2 04/30/2016     Imaging:  Ct Chest W Contrast  Addendum Date: 05/21/2016   ADDENDUM REPORT: 05/21/2016 14:12 ADDENDUM: Target Lesions: 1. Right hepatic lobe lesion measures 0.8 cm (image 27 series 2) today compared to 0.9 cm on 04/03/16. 2. Pancreatic tail mass measures 3.3 cm today (image 21 series 2) compared to 3.3 cm on 04/03/16. Non-target Lesions: 1. Multiple hepatic metastases -including posterior right lobe of liver lesion measuring 2.2 cm, image 57 of series 4. Compared with 0.8 cm on 04/03/2016 2. Gastrohepatic ligament lymphadenopathy (image 18 series 2) -present 3. Retroperitoneal lymphadenopathy (image 21 series 2)-present 4.  Persistent bilateral pleural effusions compared with 04/03/2016. Electronically Signed   By: Kerby Moors M.D.   On: 05/21/2016 14:12   Addendum Date: 05/21/2016   ADDENDUM REPORT: 05/21/2016 09:11 ADDENDUM: Hepatobiliary:  Metastasis within the posterior right lobe of liver measures 2.2 cm, image 57 of series 4. Previously 0.9 cm. Stable 0.08 cm central right lobe of liver metastasis, image 54 of series 4. Electronically Signed   By: Kerby Moors M.D.   On: 05/21/2016 09:11   Result Date: 05/21/2016 CLINICAL DATA:  Pancreas cancer. EXAM: CT CHEST, ABDOMEN, AND PELVIS WITH CONTRAST TECHNIQUE: Multidetector CT imaging of the chest, abdomen and pelvis was performed following the standard protocol during bolus administration of intravenous contrast. CONTRAST:  130mL ISOVUE-300 IOPAMIDOL (ISOVUE-300) INJECTION 61% COMPARISON:  03/10/2016 RECIST 1.1 Target Lesions: 1. Right hepatic lobe lesion measures 0.8 cm (image 54 series 4 ) today compared to 0.8 cm on 03/10/2016. 2. Pancreatic tail mass measures 3.2 cm today (image 59 of series 4) compared to 3.8 cm previously. Non-target Lesions: 1. Multiple hepatic metastases -present 2. Gastrohepatic ligament lymphadenopathy (image 55 of series 4) -present 3. Retroperitoneal lymphadenopathy (image 60 series 4)-present FINDINGS: CT CHEST FINDINGS Cardiovascular: Heart size is normal. No pericardial effusion identified. Mediastinum/Nodes: The trachea appears patent and is midline. Normal appearance of the esophagus. Right paratracheal lymph node measures 0.8 cm, image 20 of series 4, previously 0.7 cm. Lungs/Pleura: Small bilateral pleural effusions are identified. Pulmonary nodule within the right upper lobe measures 6 mm, image 49 of series 7. Previously this measured 3 mm. Left upper lobe lung nodule is stable measuring 3 mm, image 48 of series 7. Ground scratch set patchy areas of ground-glass attenuation are identified within the right upper low, image 36 of series 7. Similar appearance of scar like density in the right  middle low, image 92 of series 7. Musculoskeletal: No chest wall mass or suspicious bone lesions identified. CT ABDOMEN PELVIS FINDINGS Hepatobiliary: Metastasis within the  posterior right lobe of liver measures 2.2 cm, image 57 of series 4. Previously 0.9 cm stable 8 mm central right lobe of liver metastasis, image 54 of series 4. Pancreas: Mass within the tail of pancreas measures 3.3 cm, image 59 36 of series 2. This is compared with 3.8 cm previously. Spleen: Perfusion abnormality involving the spleen appears similar to previous exam. This likely reflects tumor involvement of the splenic artery. Adrenals/Urinary Tract: The adrenal glands are normal. Unremarkable appearance of both kidneys. The urinary bladder appears normal. Stomach/Bowel: The stomach and the small bowel loops have a normal course and caliber. There is no evidence for a bowel obstruction. Vascular/Lymphatic: Aortic atherosclerosis noted. Measures 1.6 x 2.9 cm, image 39 of series 2. On the previous exam this measured 1.2 x 1.2 cm. This is compared with the same previously. The index gastrohepatic ligament lymph node Measures 9 mm, image 55 of series 4. Previously this measured 8 mm. Reproductive: Uterus and bilateral adnexa are unremarkable. Other: There is a moderate amount sub pelvic ascites. This is new from previous exam. Musculoskeletal: No aggressive lytic or sclerotic bone lesions identified. IMPRESSION: 1. Pulmonary nodule within the right upper lobe has increased in size from previous exam and is worrisome for progression of metastatic disease. 2. Posterior right lobe of liver metastasis demonstrates significant interval increase in size from previous exam. 3. Apparent decrease in size of pancreatic tail mass. 4. Increase in size of periaortic adenopathy. 5. New bilateral pleural effusions. Electronically Signed: By: Kerby Moors M.D. On: 05/18/2016 16:57   Ct Abdomen Pelvis W Contrast  Addendum Date: 05/21/2016   ADDENDUM REPORT: 05/21/2016 14:12 ADDENDUM: Target Lesions: 1. Right hepatic lobe lesion measures 0.8 cm (image 27 series 2) today compared to 0.9 cm on 04/03/16. 2. Pancreatic tail mass  measures 3.3 cm today (image 21 series 2) compared to 3.3 cm on 04/03/16. Non-target Lesions: 1. Multiple hepatic metastases -including posterior right lobe of liver lesion measuring 2.2 cm, image 57 of series 4. Compared with 0.8 cm on 04/03/2016 2. Gastrohepatic ligament lymphadenopathy (image 18 series 2) -present 3. Retroperitoneal lymphadenopathy (image 21 series 2)-present 4.  Persistent bilateral pleural effusions compared with 04/03/2016. Electronically Signed   By: Kerby Moors M.D.   On: 05/21/2016 14:12   Addendum Date: 05/21/2016   ADDENDUM REPORT: 05/21/2016 09:11 ADDENDUM: Hepatobiliary: Metastasis within the posterior right lobe of liver measures 2.2 cm, image 57 of series 4. Previously 0.9 cm. Stable 0.08 cm central right lobe of liver metastasis, image 54 of series 4. Electronically Signed   By: Kerby Moors M.D.   On: 05/21/2016 09:11   Result Date: 05/21/2016 CLINICAL DATA:  Pancreas cancer. EXAM: CT CHEST, ABDOMEN, AND PELVIS WITH CONTRAST TECHNIQUE: Multidetector CT imaging of the chest, abdomen and pelvis was performed following the standard protocol during bolus administration of intravenous contrast. CONTRAST:  153mL ISOVUE-300 IOPAMIDOL (ISOVUE-300) INJECTION 61% COMPARISON:  03/10/2016 RECIST 1.1 Target Lesions: 1. Right hepatic lobe lesion measures 0.8 cm (image 54 series 4 ) today compared to 0.8 cm on 03/10/2016. 2. Pancreatic tail mass measures 3.2 cm today (image 59 of series 4) compared to 3.8 cm previously. Non-target Lesions: 1. Multiple hepatic metastases -present 2. Gastrohepatic ligament lymphadenopathy (image 55 of series 4) -present 3. Retroperitoneal lymphadenopathy (image 60 series 4)-present FINDINGS: CT CHEST FINDINGS Cardiovascular: Heart size is normal.  No pericardial effusion identified. Mediastinum/Nodes: The trachea appears patent and is midline. Normal appearance of the esophagus. Right paratracheal lymph node measures 0.8 cm, image 20 of series 4, previously  0.7 cm. Lungs/Pleura: Small bilateral pleural effusions are identified. Pulmonary nodule within the right upper lobe measures 6 mm, image 49 of series 7. Previously this measured 3 mm. Left upper lobe lung nodule is stable measuring 3 mm, image 48 of series 7. Ground scratch set patchy areas of ground-glass attenuation are identified within the right upper low, image 36 of series 7. Similar appearance of scar like density in the right middle low, image 92 of series 7. Musculoskeletal: No chest wall mass or suspicious bone lesions identified. CT ABDOMEN PELVIS FINDINGS Hepatobiliary: Metastasis within the posterior right lobe of liver measures 2.2 cm, image 57 of series 4. Previously 0.9 cm stable 8 mm central right lobe of liver metastasis, image 54 of series 4. Pancreas: Mass within the tail of pancreas measures 3.3 cm, image 59 36 of series 2. This is compared with 3.8 cm previously. Spleen: Perfusion abnormality involving the spleen appears similar to previous exam. This likely reflects tumor involvement of the splenic artery. Adrenals/Urinary Tract: The adrenal glands are normal. Unremarkable appearance of both kidneys. The urinary bladder appears normal. Stomach/Bowel: The stomach and the small bowel loops have a normal course and caliber. There is no evidence for a bowel obstruction. Vascular/Lymphatic: Aortic atherosclerosis noted. Measures 1.6 x 2.9 cm, image 39 of series 2. On the previous exam this measured 1.2 x 1.2 cm. This is compared with the same previously. The index gastrohepatic ligament lymph node Measures 9 mm, image 55 of series 4. Previously this measured 8 mm. Reproductive: Uterus and bilateral adnexa are unremarkable. Other: There is a moderate amount sub pelvic ascites. This is new from previous exam. Musculoskeletal: No aggressive lytic or sclerotic bone lesions identified. IMPRESSION: 1. Pulmonary nodule within the right upper lobe has increased in size from previous exam and is worrisome  for progression of metastatic disease. 2. Posterior right lobe of liver metastasis demonstrates significant interval increase in size from previous exam. 3. Apparent decrease in size of pancreatic tail mass. 4. Increase in size of periaortic adenopathy. 5. New bilateral pleural effusions. Electronically Signed: By: Kerby Moors M.D. On: 05/18/2016 16:57    Medications: I have reviewed the patient's current medications.  Assessment/Plan: 1. Pancreas cancer  Foundation 1 testing on peripheral blood 02/12/2016-no alteration detected ? MRI abdomen 08/27/2015 revealed a pancreas mass, adenopathy, and liver metastases ? CTs 09/10/2015 with pancreas body/tail mass, liver metastases, lymphadenopathy, innumerable tiny pulmonary nodules ? Ultrasound-guided biopsy of a right liver lesion 09/10/2015 confirmed adenocarcinoma ? Enrollment on the SWOG S1313 study, randomized to the standard FOLFIRINOX arm ? Cycle 1 FOLFIRINOX 09/25/2015 ? Cycle 2 FOLFIRINOX 10/09/2015 ? Cycle 3 FOLFIRINOX 10/23/2015 ? Cycle 4 FOLFIRINOX 11/05/2015  CT 11/19/2015-decrease in the size of liver lesions and the pancreas mass, ? Increased upper abdominal/retroperitoneal adenopath  Cycle 5 FOLFIRINOX 11/20/2015  Cycle 6 FOLFIRINOX 12/04/2015  Cycle 7 FOLFIRINOX 12/18/2015  Cycle 8 FOLFIRINOX 01/01/2016  Restaging CTs 01/14/2016-decrease in size of liver lesions and pancreas mass, stable to decreased upper abdominal lymphadenopathy, no evidence of disease progression  Cycle 9 FOLFIRINOX 01/15/2016  Cycle 10 FOLFIRINOX 02/12/2016 ( chemotherapy dose reduced per protocol based on toxicity)  Cycle 11 FOLFIRINOX 02/26/2016  Restaging CTs 03/10/2016 showed further decrease in the size of a pancreatic tail mass. Decrease in hepatic metastases. New 3 mm right upper lobe  pulmonary nodule possibly infectious/inflammatory. New 6 mm hypodensity in the posterior right liver. No substantial change in abnormal soft  tissue encasing the celiac axis and extending along the left periaortic retroperitoneal space. Stable 7 mm gastrohepatic ligament lymph node.  Treatment changed to FOLFIRI on a 3 week schedule beginning 03/11/2016  CT scan abdomen/pelvis and in the emergency department 04/03/2016 with new small bilateral effusions, new small amount of ascites, enlarging 11 mm hypodensity right lobe of the liver, geographic splenic hypodensity.  FOLFIRI 04/09/2016  FOLFIRI 04/30/2016  CT 05/20/2016-stable pancreas mass, enlarging right liver lesion, persistent bilateral pleural effusions, enlarging right upper lobe nodule  2. Pain secondary to #1, improved  3. History of constipation-likely secondary to narcotics  4. Mild oxaliplatin neuropathy-mild interference with activity  5.History of mucositis secondary to chemotherapy  6. Depression-grade 1, not interfering with ADLs  7. Left anterolateral chest discomfort-etiology unclear.     Disposition:  I reviewed the CT images with her. There is overall stable disease, but a liver lesion has clearly enlarged. She has increased pain. I suspect the pain is related to the primary pancreas tumor. We decided to discontinue chemotherapy for now. She will begin OxyContin and continue Dilaudid for breakthrough pain. Wendy Palmer will return for an office visit in 2 weeks. She will contact us if the OxyContin does not help her pain. She received an influenza vaccine today.  Her ECOG performance status is measured at 1 today.  Approximately 40 minutes were spent with the patient today. The majority of the time was used for counseling and coordination of care.   Betsy Coder, MD  05/21/2016  5:55 PM

## 2016-05-22 ENCOUNTER — Telehealth: Payer: Self-pay | Admitting: *Deleted

## 2016-05-22 ENCOUNTER — Other Ambulatory Visit: Payer: Self-pay | Admitting: *Deleted

## 2016-05-22 DIAGNOSIS — C252 Malignant neoplasm of tail of pancreas: Secondary | ICD-10-CM

## 2016-05-22 MED ORDER — HYDROCODONE-ACETAMINOPHEN 5-325 MG PO TABS: 1.0000 | ORAL_TABLET | Freq: Four times a day (QID) | ORAL | 0 refills | Status: DC | PRN

## 2016-05-22 NOTE — Addendum Note (Signed)
Addended by: San Morelle on: 05/22/2016 02:45 PM   Modules accepted: Orders

## 2016-05-22 NOTE — Telephone Encounter (Signed)
Call received from patient stating that her insurance will not cover Oxycontin.  Call placed to [facility] in [place] to confirm denial of Oxycontin and they stated to contact patient's insurance for approval.  Call placed to [facility] with [facility] and information given for prior auth for Oxycontin.  Ref number [id number] given for prior auth.

## 2016-05-22 NOTE — Telephone Encounter (Addendum)
Call placed to patient to inform her that Oxycontin is waiting on approval from [facility] and to continue Dilaudid and Vicodin as prescribed per Dr. Benay Spice.  Patient states that she is hesitant to take Oxycontin d/t she is afraid it will make her "too sleepy," but is willing to try it.  Patient requests refill of Vicodin that she will pick up tomorrow morning in the infusion room.  Patient appreciative of call and was out walking in her neighborhood at time of call.  She has no questions or concerns at this time.

## 2016-05-23 ENCOUNTER — Ambulatory Visit: Payer: Medicare Other

## 2016-05-25 ENCOUNTER — Encounter: Payer: Self-pay | Admitting: Oncology

## 2016-05-26 ENCOUNTER — Encounter: Payer: Self-pay | Admitting: Oncology

## 2016-05-26 ENCOUNTER — Ambulatory Visit (HOSPITAL_COMMUNITY)
Admission: RE | Admit: 2016-05-26 | Discharge: 2016-05-26 | Disposition: A | Discharge status: Home / Self Care | Payer: Medicare Other | Source: Ambulatory Visit | Attending: Nurse Practitioner | Admitting: Nurse Practitioner

## 2016-05-26 ENCOUNTER — Ambulatory Visit (HOSPITAL_BASED_OUTPATIENT_CLINIC_OR_DEPARTMENT_OTHER): Payer: Medicare Other | Admitting: Nurse Practitioner

## 2016-05-26 ENCOUNTER — Other Ambulatory Visit: Payer: Self-pay | Admitting: Nurse Practitioner

## 2016-05-26 VITALS — BP 130/64 | HR 104 | Temp 98.7°F | Resp 18 | Ht 62.0 in | Wt 108.5 lb

## 2016-05-26 DIAGNOSIS — C787 Secondary malignant neoplasm of liver and intrahepatic bile duct: Secondary | ICD-10-CM

## 2016-05-26 DIAGNOSIS — J90 Pleural effusion, not elsewhere classified: Secondary | ICD-10-CM

## 2016-05-26 DIAGNOSIS — R140 Abdominal distension (gaseous): Secondary | ICD-10-CM | POA: Diagnosis present

## 2016-05-26 DIAGNOSIS — C259 Malignant neoplasm of pancreas, unspecified: Secondary | ICD-10-CM | POA: Diagnosis present

## 2016-05-26 DIAGNOSIS — C252 Malignant neoplasm of tail of pancreas: Secondary | ICD-10-CM

## 2016-05-26 DIAGNOSIS — R188 Other ascites: Secondary | ICD-10-CM | POA: Diagnosis present

## 2016-05-26 DIAGNOSIS — G620 Drug-induced polyneuropathy: Secondary | ICD-10-CM | POA: Diagnosis not present

## 2016-05-26 DIAGNOSIS — C258 Malignant neoplasm of overlapping sites of pancreas: Secondary | ICD-10-CM

## 2016-05-26 NOTE — Progress Notes (Signed)
Patient ID: Wendy Palmer, female   DOB: 1950-10-04, 65 y.o.   MRN: [id number] Pt presented to [place] dept today for paracentesis. On limited US abd in all four quadrants there is only a small amount of ascites noted in RUQ /perihepatic space which is not safely amenable to tap at this time. Above d/w pt and [name]. Procedure cancelled.

## 2016-05-26 NOTE — Telephone Encounter (Signed)
Called pt she reports abdominal "puffiness" and discomfort. She reports abdominal pain and L chest pressure on deep inspiration. Pt reports bowels are moving, abdomen is soft but tender to touch.  Discussed with Dr. Benay Spice: OK to work pt in to be seen.

## 2016-05-26 NOTE — Progress Notes (Addendum)
[place] OFFICE PROGRESS NOTE   Diagnosis:  Pancreas cancer  INTERVAL HISTORY:   Wendy Palmer returns prior to scheduled follow-up for evaluation of abdominal pain. She reports onset of generalized abdominal pain and a "swollen" sensation throughout the abdomen about 3 days ago after eating a large lunch. She describes the abdominal pain as "achy". She has tried ibuprofen as well as Dilaudid with partial relief. She feels like she is unable to take a deep breath. She notes upper abdominal pain with deep breathing. She has some shortness of breath. No fever. She continues to have left side and back pain. Bowels overall have been moving regularly. No nausea or vomiting.   Objective:  Vital signs in last 24 hours:  Blood pressure 130/64, pulse (!) 104, temperature 98.7 F (37.1 C), temperature source Oral, resp. rate 18, height 5\' 2"  (1.575 m), weight 108 lb 8 oz (49.2 kg), SpO2 100 %.    HEENT: No thrush or ulcers. Lymphatics: No palpable cervical or supraclavicular lymph nodes. Resp: Lungs clear bilaterally. Cardio: Regular rate and rhythm. GI: Abdomen is distended. She appears to have ascites. Vascular: No leg edema. Calves soft and nontender.    Lab Results:  Lab Results  Component Value Date   WBC 7.3 04/30/2016   HGB 9.5 (L) 04/30/2016   HCT 29.9 (L) 04/30/2016   MCV 95.5 04/30/2016   PLT 190 04/30/2016   NEUTROABS 4.2 04/30/2016    Imaging:  No results found.  Medications: I have reviewed the patient's current medications.  Assessment/Plan: 1. Pancreas cancer  Foundation 1 testing on peripheral blood 02/12/2016-no alteration detected ? MRI abdomen 08/27/2015 revealed a pancreas mass, adenopathy, and liver metastases ? CTs 09/10/2015 with pancreas body/tail mass, liver metastases, lymphadenopathy, innumerable tiny pulmonary nodules ? Ultrasound-guided biopsy of a right liver lesion 09/10/2015 confirmed adenocarcinoma ? Enrollment on  the SWOG S1313 study, randomized to the standard FOLFIRINOX arm ? Cycle 1 FOLFIRINOX 09/25/2015 ? Cycle 2 FOLFIRINOX 10/09/2015 ? Cycle 3 FOLFIRINOX 10/23/2015 ? Cycle 4 FOLFIRINOX 11/05/2015  CT 11/19/2015-decrease in the size of liver lesions and the pancreas mass, ? Increased upper abdominal/retroperitoneal adenopath  Cycle 5 FOLFIRINOX 11/20/2015  Cycle 6 FOLFIRINOX 12/04/2015  Cycle 7 FOLFIRINOX 12/18/2015  Cycle 8 FOLFIRINOX 01/01/2016  Restaging CTs 01/14/2016-decrease in size of liver lesions and pancreas mass, stable to decreased upper abdominal lymphadenopathy, no evidence of disease progression  Cycle 9 FOLFIRINOX 01/15/2016  Cycle 10 FOLFIRINOX 02/12/2016 ( chemotherapy dose reduced per protocol based on toxicity)  Cycle 11 FOLFIRINOX 02/26/2016  Restaging CTs 03/10/2016 showed further decrease in the size of a pancreatic tail mass. Decrease in hepatic metastases. New 3 mm right upper lobe pulmonary nodule possibly infectious/inflammatory. New 6 mm hypodensity in the posterior right liver. No substantial change in abnormal soft tissue encasing the celiac axis and extending along the left periaortic retroperitoneal space. Stable 7 mm gastrohepatic ligament lymph node.  Treatment changed to FOLFIRI on a 3 week schedule beginning 03/11/2016  CT scan abdomen/pelvis and in the emergency department 04/03/2016 with new small bilateral effusions, new small amount of ascites, enlarging 11 mm hypodensity right lobe of the liver, geographic splenic hypodensity.  FOLFIRI 04/09/2016  FOLFIRI 04/30/2016  CT 05/20/2016-stable pancreas mass, enlarging right liver lesion, persistent bilateral pleural effusions, enlarging right upper lobe nodule  2. Pain secondary to #1, improved  3. History of constipation-likely secondary to narcotics  4. Mild oxaliplatin neuropathy-mild interference with activity  5.History of mucositis secondary to chemotherapy  6.  Depression-grade  1, not interfering with ADLs  7. Left anterolateral chest discomfort-etiology unclear.   Disposition: Wendy Palmer presents with an approximate 3 day history of abdominal distention and pain. On exam she appears to have ascites. We are referring her for a diagnostic/therapeutic paracentesis with fluid to be sent for cytology. She will continue Dilaudid 4 mg 1-2 tablets every 4 hours as needed. She understands that she should not be driving while taking pain medication.  She will keep her scheduled follow-up appointment on 06/02/2016. She will contact the office in the interim with any problems.  Patient seen with Dr. Benay Spice. 25 minutes were spent face-to-face at today's visit with the majority of that time involved in counseling/coordination of care.    Ned Card ANP/GNP-BC   05/26/2016  3:30 PM This was a shared visit with Ned Card. Wendy Palmer was interviewed and examined. She has increased pain and abdominal distention. We will refer her for an abdominal ultrasound and therapeutic paracentesis as indicated. I suspect the pain is related to local progression of the pancreas cancer and possibly carcinomatosis. We encouraged her to use Dilaudid as needed for pain. We are waiting on insurance approval for OxyContin.  Julieanne Manson, M.D.

## 2016-05-29 ENCOUNTER — Telehealth: Payer: Self-pay | Admitting: *Deleted

## 2016-05-29 NOTE — Telephone Encounter (Signed)
Returned call to Wendy Palmer, informed her Dr. Benay Spice plans to discuss treatment options vs supportive care during next visit. She was "hoping for more information than that" wants to know "why hasn't she had treatment in 5 weeks?"  [name] plans to come to next visit, is concerned that chemo is not scheduled that day.  Explained if the decision is made for chemo, we will schedule appts during visit.  She stated several times she is not satisfied with waiting until Tuesday to discuss.  Informed Wendy Palmer that Dr. Benay Spice is out of the office this afternoon, there aren't any plans to start treatment before Tuesday. She requested a call from [place], NP. Informed her I will forward request, that ultimately these decisions will come from Dr. Benay Spice and I can request MD call on Monday when in the office. She voiced understanding.

## 2016-05-29 NOTE — Telephone Encounter (Signed)
Call received @ 1050 from Ms. Jeannee Danis In regards to her mother's appt, on 10/3 wanting to why she wasn't being seen by Dr.Sherrill, She states that she didn't know who Ned Card was, also she stated that she was unable to come with her mother on last office visit and had questions on why her mother hasn't been receiving her chemo tmts. She asked to speck with  Ned Card, I told her that I would get to message to you directly.

## 2016-05-29 NOTE — Telephone Encounter (Signed)
Dr. Benay Spice spoke with pt's daughter.

## 2016-05-29 NOTE — Telephone Encounter (Signed)
Call received @ 1050 from Ms. Wendy Palmer In regards to her mother's appt, on 10/3 wanting to why she wasn't being seen by Dr.Sherrill, She states that she didn't know who Ned Card was, also she stated that she was unable to come with her mother on last office visit and had questions on why her mother hasn't been receiving her chemo tmts. She asked to speck with  Ned Card, I told her that I would get to message to you directly.

## 2016-05-29 NOTE — Telephone Encounter (Signed)
Daughter has called and asking to discuss what are the next steps for her mother? Was under the impression that MD was investigating other options. They are concerned that she is getting weaker and her abdomen is more distended. Next appointment is on 06/02/16 with no treatment scheduled that day. Made her aware the message will be forwarded to MD.

## 2016-06-01 ENCOUNTER — Telehealth: Payer: Self-pay | Admitting: Nurse Practitioner

## 2016-06-01 ENCOUNTER — Encounter: Payer: Self-pay | Admitting: Oncology

## 2016-06-01 NOTE — Telephone Encounter (Signed)
lvm to inform pt of 11/7 appt per LOS

## 2016-06-02 ENCOUNTER — Encounter: Payer: Self-pay | Admitting: *Deleted

## 2016-06-02 ENCOUNTER — Other Ambulatory Visit: Payer: Self-pay | Admitting: *Deleted

## 2016-06-02 ENCOUNTER — Telehealth: Payer: Self-pay | Admitting: Oncology

## 2016-06-02 ENCOUNTER — Ambulatory Visit (HOSPITAL_BASED_OUTPATIENT_CLINIC_OR_DEPARTMENT_OTHER): Payer: Medicare Other | Admitting: Nurse Practitioner

## 2016-06-02 ENCOUNTER — Ambulatory Visit (HOSPITAL_COMMUNITY)
Admission: RE | Admit: 2016-06-02 | Discharge: 2016-06-02 | Disposition: A | Discharge status: Home / Self Care | Payer: Medicare Other | Source: Ambulatory Visit | Attending: Oncology | Admitting: Oncology

## 2016-06-02 VITALS — BP 127/63 | HR 106 | Temp 98.0°F | Resp 18 | Ht 62.0 in | Wt 114.7 lb

## 2016-06-02 DIAGNOSIS — G893 Neoplasm related pain (acute) (chronic): Secondary | ICD-10-CM

## 2016-06-02 DIAGNOSIS — R600 Localized edema: Secondary | ICD-10-CM | POA: Diagnosis not present

## 2016-06-02 DIAGNOSIS — C252 Malignant neoplasm of tail of pancreas: Secondary | ICD-10-CM | POA: Insufficient documentation

## 2016-06-02 DIAGNOSIS — C258 Malignant neoplasm of overlapping sites of pancreas: Secondary | ICD-10-CM

## 2016-06-02 DIAGNOSIS — R140 Abdominal distension (gaseous): Secondary | ICD-10-CM

## 2016-06-02 DIAGNOSIS — R188 Other ascites: Secondary | ICD-10-CM | POA: Insufficient documentation

## 2016-06-02 DIAGNOSIS — C787 Secondary malignant neoplasm of liver and intrahepatic bile duct: Secondary | ICD-10-CM | POA: Diagnosis not present

## 2016-06-02 MED ORDER — MORPHINE SULFATE ER 15 MG PO TBCR: 15.0000 mg | EXTENDED_RELEASE_TABLET | Freq: Two times a day (BID) | ORAL | 0 refills | Status: AC

## 2016-06-02 MED ORDER — HYDROMORPHONE HCL 4 MG PO TABS: 4.0000 mg | ORAL_TABLET | ORAL | 0 refills | Status: AC | PRN

## 2016-06-02 NOTE — Procedures (Signed)
Ultrasound-guided diagnostic and therapeutic paracentesis performed yielding 2.2 liters of slightly hazy, yellow  fluid. No immediate complications. A portion of the fluid was sent to the lab for cytology and culture.

## 2016-06-02 NOTE — Progress Notes (Signed)
START ON PATHWAY REGIMEN - Pancreatic  PANOS51: Nab-Paclitaxel (Abraxane) 125 mg/m2 D1, 8, 15 + Gemcitabine 1,000 mg/m2 D1, 8, 15 q28 Days Until Progression or Toxicity   A cycle is every 28 days:     Nab-Paclitaxel (protein bound) (Abraxane(R)) 125 mg/m2 in NS to a concentration of 5 mg/mL given IV over 30 mins days 1, 8, and 15 followed by Dose Mod: None     Gemcitabine (Gemzar(R)) 1000 mg/m2 in 250 mL NS IV over 30 minutes days 1, 8, and 15. Dose Mod: None Additional Orders: Hold therapy and notify physician if [place] < 1500.  **Always confirm dose/schedule in your pharmacy ordering system**    Patient Characteristics: Adenocarcinoma, Metastatic Disease, Second Line, If FOLFIRINOX First Line Current evidence of distant metastases? Yes AJCC T Stage: X AJCC N Stage: X AJCC Stage Grouping: IV AJCC M Stage: X Histology: Adenocarcinoma Line of therapy: Second Line Would you be surprised if this patient died  in the next year? I would NOT be surprised if this patient died in the next year  Intent of Therapy: Non-Curative / Palliative Intent, Discussed with Patient

## 2016-06-02 NOTE — Progress Notes (Addendum)
[facility] OFFICE PROGRESS NOTE   Diagnosis: Pancreas cancer  INTERVAL HISTORY:   Wendy Palmer returns as scheduled. She complains of increased abdominal pain. Her insurance company did not approve OxyContin. She is taking Dilaudid more frequently. The pain is throughout the upper abdomen and lower chest. She is constipated. She started MiraLAX and Colace within the past few days. No nausea/vomiting. The abdomen is more distended. She presents today with her daughters to discuss treatment options.  Objective:  Vital signs in last 24 hours:  Blood pressure 127/63, pulse (!) 106, temperature 98 F (36.7 C), temperature source Oral, resp. rate 18, height 5\' 2"  (1.575 m), weight 114 lb 11.2 oz (52 kg), SpO2 99 %.   Resp: Lungs clear bilaterally Cardio: Regular rate and rhythm GI: No mass, nontender, the abdomen is distended. Lipoma? In the subxiphoid region Vascular: No leg edema Neuro: Alert and oriented    Portacath/PICC-without erythema  Lab Results:  Lab Results  Component Value Date   WBC 7.3 04/30/2016   HGB 9.5 (L) 04/30/2016   HCT 29.9 (L) 04/30/2016   MCV 95.5 04/30/2016   PLT 190 04/30/2016   NEUTROABS 4.2 04/30/2016     Medications: I have reviewed the patient's current medications.  Assessment/Plan: 1. Pancreas cancer  Foundation 1 testing on peripheral blood 02/12/2016-no alteration detected ? MRI abdomen 08/27/2015 revealed a pancreas mass, adenopathy, and liver metastases ? CTs 09/10/2015 with pancreas body/tail mass, liver metastases, lymphadenopathy, innumerable tiny pulmonary nodules ? Ultrasound-guided biopsy of a right liver lesion 09/10/2015 confirmed adenocarcinoma ? Enrollment on the SWOG S1313 study, randomized to the standard FOLFIRINOX arm ? Cycle 1 FOLFIRINOX 09/25/2015 ? Cycle 2 FOLFIRINOX 10/09/2015 ? Cycle 3 FOLFIRINOX 10/23/2015 ? Cycle 4 FOLFIRINOX 11/05/2015  CT 11/19/2015-decrease in the size of liver  lesions and the pancreas mass, ? Increased upper abdominal/retroperitoneal adenopath  Cycle 5 FOLFIRINOX 11/20/2015  Cycle 6 FOLFIRINOX 12/04/2015  Cycle 7 FOLFIRINOX 12/18/2015  Cycle 8 FOLFIRINOX 01/01/2016  Restaging CTs 01/14/2016-decrease in size of liver lesions and pancreas mass, stable to decreased upper abdominal lymphadenopathy, no evidence of disease progression  Cycle 9 FOLFIRINOX 01/15/2016  Cycle 10 FOLFIRINOX 02/12/2016 ( chemotherapy dose reduced per protocol based on toxicity)  Cycle 11 FOLFIRINOX 02/26/2016  Restaging CTs 03/10/2016 showed further decrease in the size of a pancreatic tail mass. Decrease in hepatic metastases. New 3 mm right upper lobe pulmonary nodule possibly infectious/inflammatory. New 6 mm hypodensity in the posterior right liver. No substantial change in abnormal soft tissue encasing the celiac axis and extending along the left periaortic retroperitoneal space. Stable 7 mm gastrohepatic ligament lymph node.  Treatment changed to FOLFIRI on a 3 week schedule beginning 03/11/2016  CT scan abdomen/pelvis and in the emergency department 04/03/2016 with new small bilateral effusions, new small amount of ascites, enlarging 11 mm hypodensity right lobe of the liver, geographic splenic hypodensity.  FOLFIRI 04/09/2016  FOLFIRI 04/30/2016  CT 05/20/2016-stable pancreas mass, enlarging right liver lesion, persistent bilateral pleural effusions, enlarging right upper lobe nodule  Clinical progression at office visit 06/02/2016, treatment changed to gemcitabine/Abraxane  2. Pain secondary to #1, improved  3. History of constipation-likely secondary to narcotics  4. Mild oxaliplatin neuropathy-mild interference with activity  5.History of mucositis secondary to chemotherapy  6. Depression-grade 1, not interfering with ADLs  7. Left anterolateral chest discomfort-etiology unclear.    Disposition:  Wendy Palmer has increased and  progressive abdominal distention. There is clinical evidence of disease progression. We will refer her to radiology  for an abdominal ultrasound and therapeutic/diagnostic paracentesis as indicated.  We discussed hospice care versus a trial of salvage chemotherapy. She would like to proceed with salvage chemotherapy. I recommend gemcitabine/Abraxane. She understands there is a small chance of a measurable response with this regimen. We reviewed the potential toxicities associated with gemcitabine/Abraxane including the chance for nausea/vomiting, alopecia, and hematologic toxicity. We discussed the fever, rash, and pneumonitis associated with gemcitabine. We discussed the chance for neuropathy with Abraxane. She agrees to proceed.  The plan is to begin gemcitabine/Abraxane on a 2 week schedule 06/09/2016.  We will submit molecular testing on ascitic fluid if tumor cells are confirmed. If not the plan is to resubmit peripheral blood for molecular testing.  She will begin MS Contin and continue Dilaudid for pain.  Her ECOG performance status is measured at 1 today.  Approximately 40 minutes were spent with the patient today. The majority of the time was used for counseling and coordination of care.  Betsy Coder, MD  06/02/2016  10:21 AM

## 2016-06-02 NOTE — Progress Notes (Signed)
Oncology Nurse Navigator Documentation  Oncology Nurse Navigator Flowsheets 06/02/2016  Navigator Location [facility]  Navigator Encounter Type Follow-up Appt  Telephone -  Abnormal Finding Date -  Confirmed Diagnosis Date -  Treatment Initiated Date -  Patient Visit Type MedOnc;Follow-up  Treatment Phase Abnormal Scans;Pre-Tx/Tx Discussion  Barriers/Navigation Needs Education;Coordination of Care--Long acting narcotic  Education Understanding Cancer/ Treatment Options  Interventions Coordination of Care--  Referrals -  Coordination of Care Other--spoke w/Rich at [facility]. The Oxycontin was denied since not on formulary. Must try and fail MS Contin before it will be covered. Patient was given script for MS Contin and will try this.  Education Method Verbal;Written--Abraxane and Gemzar; how to take MS Contin and when she can expect to note effectiveness, side effects and management of constipaton  Support Groups/Services -  [facility]uity -  Time Spent with Patient 30

## 2016-06-02 NOTE — Telephone Encounter (Signed)
Gave dtr avs report and appointments for October and November. Patient already at [facility] for paracentesis.

## 2016-06-03 ENCOUNTER — Encounter: Payer: Self-pay | Admitting: Oncology

## 2016-06-04 ENCOUNTER — Telehealth: Payer: Self-pay | Admitting: *Deleted

## 2016-06-04 NOTE — Telephone Encounter (Signed)
Call received from patient asking if she should take MS Contin and Dilaudid together.  Patient instructed to take MS Contin every 12 hrs and that it is a long acting pain medicine, where the Dilaudid is for break through pain and can be taken every 4 hours if needed.  Patient appreciative of information and has no further questions at this time.

## 2016-06-05 ENCOUNTER — Telehealth: Payer: Self-pay | Admitting: *Deleted

## 2016-06-05 NOTE — Telephone Encounter (Signed)
Pt reports had fluid drained off her abd yesterday.  She is still feeling sore and a little swollen today.  She asks if this is normal?  It is not as bad as yesterday. She is taking her pain medication as prescribed.  Pt also reports constipation w/ no BM in 10 days.   She has been taking Miralax twice a day and one Colace once daily for about the last 3 to 4 days.

## 2016-06-05 NOTE — Telephone Encounter (Signed)
Call returned to patient to inform her per Dr. Benay Spice to continue Miralax twice a day, increase Colace to twice a day, to take half a bottle of Mag Citrate today and if no BM by tomorrow morning to take the other half of Mag Citrate.  Patient appreciative of call back and has no other questions at this time.

## 2016-06-06 ENCOUNTER — Emergency Department (HOSPITAL_COMMUNITY): Payer: Medicare Other

## 2016-06-06 ENCOUNTER — Encounter (HOSPITAL_COMMUNITY): Payer: Self-pay

## 2016-06-06 ENCOUNTER — Inpatient Hospital Stay (HOSPITAL_COMMUNITY)
Admission: EM | Admit: 2016-06-06 | Discharge: 2016-06-08 | DRG: 374 | Disposition: A | Discharge status: Hospice | Payer: Medicare Other | Attending: Internal Medicine | Admitting: Internal Medicine

## 2016-06-06 DIAGNOSIS — K5900 Constipation, unspecified: Secondary | ICD-10-CM | POA: Diagnosis present

## 2016-06-06 DIAGNOSIS — F329 Major depressive disorder, single episode, unspecified: Secondary | ICD-10-CM | POA: Diagnosis present

## 2016-06-06 DIAGNOSIS — Z66 Do not resuscitate: Secondary | ICD-10-CM | POA: Diagnosis not present

## 2016-06-06 DIAGNOSIS — Z888 Allergy status to other drugs, medicaments and biological substances status: Secondary | ICD-10-CM

## 2016-06-06 DIAGNOSIS — G620 Drug-induced polyneuropathy: Secondary | ICD-10-CM | POA: Diagnosis present

## 2016-06-06 DIAGNOSIS — K2900 Acute gastritis without bleeding: Secondary | ICD-10-CM | POA: Diagnosis present

## 2016-06-06 DIAGNOSIS — T40605A Adverse effect of unspecified narcotics, initial encounter: Secondary | ICD-10-CM | POA: Diagnosis present

## 2016-06-06 DIAGNOSIS — K652 Spontaneous bacterial peritonitis: Secondary | ICD-10-CM | POA: Diagnosis present

## 2016-06-06 DIAGNOSIS — Z800 Family history of malignant neoplasm of digestive organs: Secondary | ICD-10-CM

## 2016-06-06 DIAGNOSIS — T451X5A Adverse effect of antineoplastic and immunosuppressive drugs, initial encounter: Secondary | ICD-10-CM | POA: Diagnosis present

## 2016-06-06 DIAGNOSIS — K5903 Drug induced constipation: Secondary | ICD-10-CM | POA: Diagnosis present

## 2016-06-06 DIAGNOSIS — R911 Solitary pulmonary nodule: Secondary | ICD-10-CM | POA: Diagnosis present

## 2016-06-06 DIAGNOSIS — Z8249 Family history of ischemic heart disease and other diseases of the circulatory system: Secondary | ICD-10-CM

## 2016-06-06 DIAGNOSIS — R0789 Other chest pain: Secondary | ICD-10-CM | POA: Diagnosis present

## 2016-06-06 DIAGNOSIS — E43 Unspecified severe protein-calorie malnutrition: Secondary | ICD-10-CM | POA: Diagnosis not present

## 2016-06-06 DIAGNOSIS — Z79899 Other long term (current) drug therapy: Secondary | ICD-10-CM | POA: Diagnosis not present

## 2016-06-06 DIAGNOSIS — C801 Malignant (primary) neoplasm, unspecified: Secondary | ICD-10-CM

## 2016-06-06 DIAGNOSIS — R180 Malignant ascites: Secondary | ICD-10-CM | POA: Diagnosis present

## 2016-06-06 DIAGNOSIS — R140 Abdominal distension (gaseous): Secondary | ICD-10-CM | POA: Diagnosis not present

## 2016-06-06 DIAGNOSIS — Z87891 Personal history of nicotine dependence: Secondary | ICD-10-CM | POA: Diagnosis not present

## 2016-06-06 DIAGNOSIS — I10 Essential (primary) hypertension: Secondary | ICD-10-CM | POA: Diagnosis present

## 2016-06-06 DIAGNOSIS — C786 Secondary malignant neoplasm of retroperitoneum and peritoneum: Secondary | ICD-10-CM | POA: Diagnosis present

## 2016-06-06 DIAGNOSIS — Z515 Encounter for palliative care: Secondary | ICD-10-CM | POA: Diagnosis not present

## 2016-06-06 DIAGNOSIS — C258 Malignant neoplasm of overlapping sites of pancreas: Secondary | ICD-10-CM | POA: Diagnosis not present

## 2016-06-06 DIAGNOSIS — C787 Secondary malignant neoplasm of liver and intrahepatic bile duct: Secondary | ICD-10-CM | POA: Diagnosis present

## 2016-06-06 DIAGNOSIS — C252 Malignant neoplasm of tail of pancreas: Secondary | ICD-10-CM | POA: Diagnosis present

## 2016-06-06 LAB — CBC WITH DIFFERENTIAL/PLATELET
Basophils Absolute: 0 10*3/uL (ref 0.0–0.1)
Basophils Relative: 0 %
Eosinophils Absolute: 0.2 10*3/uL (ref 0.0–0.7)
Eosinophils Relative: 1 %
HEMATOCRIT: 30 % — AB (ref 36.0–46.0)
HEMOGLOBIN: 9.6 g/dL — AB (ref 12.0–15.0)
LYMPHS ABS: 0.9 10*3/uL (ref 0.7–4.0)
LYMPHS PCT: 6 %
MCH: 28.4 pg (ref 26.0–34.0)
MCHC: 32 g/dL (ref 30.0–36.0)
MCV: 88.8 fL (ref 78.0–100.0)
Monocytes Absolute: 1.5 10*3/uL — ABNORMAL HIGH (ref 0.1–1.0)
Monocytes Relative: 10 %
NEUTROS PCT: 83 %
Neutro Abs: 12.2 10*3/uL — ABNORMAL HIGH (ref 1.7–7.7)
Platelets: 330 10*3/uL (ref 150–400)
RBC: 3.38 MIL/uL — AB (ref 3.87–5.11)
RDW: 16.8 % — ABNORMAL HIGH (ref 11.5–15.5)
WBC: 14.8 10*3/uL — AB (ref 4.0–10.5)

## 2016-06-06 LAB — COMPREHENSIVE METABOLIC PANEL
ALT: 14 U/L (ref 14–54)
ANION GAP: 7 (ref 5–15)
AST: 17 U/L (ref 15–41)
Albumin: 2.4 g/dL — ABNORMAL LOW (ref 3.5–5.0)
Alkaline Phosphatase: 125 U/L (ref 38–126)
BUN: 17 mg/dL (ref 6–20)
CHLORIDE: 101 mmol/L (ref 101–111)
CO2: 26 mmol/L (ref 22–32)
CREATININE: 0.5 mg/dL (ref 0.44–1.00)
Calcium: 7.9 mg/dL — ABNORMAL LOW (ref 8.9–10.3)
GFR calc Af Amer: >60 mL/min (ref >60)
GFR calc non Af Amer: >60 mL/min (ref >60)
Glucose, Bld: 120 mg/dL — ABNORMAL HIGH (ref 65–99)
POTASSIUM: 4.7 mmol/L (ref 3.5–5.1)
Sodium: 134 mmol/L — ABNORMAL LOW (ref 135–145)
Total Bilirubin: 0.7 mg/dL (ref 0.3–1.2)
Total Protein: 5.5 g/dL — ABNORMAL LOW (ref 6.5–8.1)

## 2016-06-06 LAB — BODY FLUID CULTURE: CULTURE: NO GROWTH

## 2016-06-06 LAB — LACTATE DEHYDROGENASE, PLEURAL OR PERITONEAL FLUID: LD FL: 295 U/L — AB (ref 3–23)

## 2016-06-06 LAB — BODY FLUID CELL COUNT WITH DIFFERENTIAL
COLOR FL: 1591 — AB
Eos, Fluid: 0 %
LYMPHS FL: 21 %
Monocyte-Macrophage-Serous Fluid: 17 % — ABNORMAL LOW (ref 50–90)
NEUTROPHIL FLUID: 62 % — AB (ref 0–25)

## 2016-06-06 LAB — PROTEIN, BODY FLUID

## 2016-06-06 LAB — ALBUMIN, FLUID (OTHER): ALBUMIN FL: 1.5 g/dL

## 2016-06-06 LAB — LIPASE, BLOOD: LIPASE: 14 U/L (ref 11–51)

## 2016-06-06 LAB — GLUCOSE, PERITONEAL FLUID: GLUCOSE, PERITONEAL FLUID: 112 mg/dL

## 2016-06-06 MED ORDER — POLYETHYLENE GLYCOL 3350 17 G PO PACK: 17.0000 g | PACK | Freq: Every day | ORAL | Status: DC | PRN

## 2016-06-06 MED ORDER — IMIPRAMINE HCL 50 MG PO TABS
150.0000 mg | ORAL_TABLET | Freq: Every day | ORAL | Status: DC
  Administered 2016-06-06 – 2016-06-07 (×2): 150 mg via ORAL
  Filled 2016-06-06 (×2): qty 3

## 2016-06-06 MED ORDER — HYDROMORPHONE HCL 2 MG PO TABS
4.0000 mg | ORAL_TABLET | ORAL | Status: DC | PRN
  Administered 2016-06-06 – 2016-06-08 (×5): 4 mg via ORAL
  Filled 2016-06-06 (×5): qty 2

## 2016-06-06 MED ORDER — ZOLPIDEM TARTRATE 5 MG PO TABS
5.0000 mg | ORAL_TABLET | Freq: Once | ORAL | Status: AC
  Administered 2016-06-07: 5 mg via ORAL
  Filled 2016-06-06: qty 1

## 2016-06-06 MED ORDER — SENNA 8.6 MG PO TABS: 1.0000 | ORAL_TABLET | Freq: Every day | ORAL | Status: DC

## 2016-06-06 MED ORDER — IOPAMIDOL (ISOVUE-300) INJECTION 61%
30.0000 mL | Freq: Once | INTRAVENOUS | Status: AC | PRN
  Administered 2016-06-06: 30 mL via ORAL

## 2016-06-06 MED ORDER — HYDROCODONE-ACETAMINOPHEN 5-325 MG PO TABS: 1.0000 | ORAL_TABLET | Freq: Four times a day (QID) | ORAL | Status: DC | PRN

## 2016-06-06 MED ORDER — LIDOCAINE-EPINEPHRINE 2 %-1:100000 IJ SOLN: 20.0000 mL | Freq: Once | INTRAMUSCULAR | Status: DC

## 2016-06-06 MED ORDER — ENOXAPARIN SODIUM 40 MG/0.4ML ~~LOC~~ SOLN
40.0000 mg | SUBCUTANEOUS | Status: DC
Start: 2016-06-06 — End: 2016-06-08
  Administered 2016-06-06 – 2016-06-07 (×2): 40 mg via SUBCUTANEOUS
  Filled 2016-06-06 (×2): qty 0.4

## 2016-06-06 MED ORDER — IOPAMIDOL (ISOVUE-300) INJECTION 61%
100.0000 mL | Freq: Once | INTRAVENOUS | Status: AC | PRN
  Administered 2016-06-06: 100 mL via INTRAVENOUS

## 2016-06-06 MED ORDER — MORPHINE SULFATE ER 15 MG PO TBCR
15.0000 mg | EXTENDED_RELEASE_TABLET | Freq: Two times a day (BID) | ORAL | Status: DC
  Administered 2016-06-06 – 2016-06-08 (×4): 15 mg via ORAL
  Filled 2016-06-06 (×4): qty 1

## 2016-06-06 MED ORDER — MILK AND MOLASSES ENEMA
1.0000 | Freq: Once | RECTAL | Status: AC
  Administered 2016-06-06: 250 mL via RECTAL
  Filled 2016-06-06: qty 250

## 2016-06-06 MED ORDER — SODIUM CHLORIDE 0.9 % IV SOLN
INTRAVENOUS | Status: DC
  Administered 2016-06-06 – 2016-06-07 (×3): via INTRAVENOUS

## 2016-06-06 MED ORDER — DOCUSATE SODIUM 100 MG PO CAPS
100.0000 mg | ORAL_CAPSULE | Freq: Two times a day (BID) | ORAL | Status: DC
  Administered 2016-06-06: 100 mg via ORAL
  Filled 2016-06-06: qty 1

## 2016-06-06 MED ORDER — LIDOCAINE-EPINEPHRINE (PF) 2 %-1:200000 IJ SOLN
10.0000 mL | Freq: Once | INTRAMUSCULAR | Status: AC
  Administered 2016-06-06: 10 mL via INTRADERMAL
  Filled 2016-06-06: qty 10

## 2016-06-06 MED ORDER — DEXTROSE 5 % IV SOLN
2.0000 g | INTRAVENOUS | Status: DC
  Administered 2016-06-06 – 2016-06-07 (×2): 2 g via INTRAVENOUS
  Filled 2016-06-06 (×2): qty 2

## 2016-06-06 NOTE — ED Provider Notes (Signed)
[facility] DEPT Provider Note   CSN: [id number] Arrival date & time: 06/06/16  1015     History   Chief Complaint Chief Complaint  Patient presents with  . Constipation  . Pancreatic Cancer    HPI Wendy Palmer is a 65 y.o. female.  Patient is a 65 year old female with history of pancreatic cancer and hypertension who presents the ED with complaint of constipation, onset 10 days. Patient states her last bowel movement was 10 days ago. She also reports having associated abdominal distention and diffuse abdominal discomfort which she states is constant and worse with movement. She notes she has been taking Colace daily for the past 5 days and states she took half a bottle of magnesium citrate yesterday but notes she has not had a bowel movement. She notes on 10/3 she had a paracentesis done resulting in having 4 L taken off. She notes her last chemotherapy treatment was 10 months ago but she is scheduled to start chemotherapy again in the next week. Denies fever, chills, chest pain, shortness of breath, cough, vomiting, diarrhea, urinary symptoms, back pain. She notes she has continue taking her home pain meds including Norco and Dilaudid as prescribed. She reports having chronic intermittent nausea which she states is unchanged.  PCP- Dr. Laurance Flatten Oncologist- Dr. Benay Spice      Past Medical History:  Diagnosis Date  . Cancer [facility])    Pancreatic  . Depression   . Diverticulosis   . Hypertension   . Patella fracture     Patient Active Problem List   Diagnosis Date Noted  . SBP (spontaneous bacterial peritonitis) ([place]) 06/06/2016  . Constipation 06/06/2016  . Facial flushing 02/14/2016  . Liver mass   . Cancer of pancreas, tail ([place]) 09/05/2015  . Hot flashes, menopausal 03/13/2014  . Rosacea 12/21/2012  . Essential hypertension 12/21/2012  . Hyperlipemia 12/21/2012    Past Surgical History:  Procedure Laterality Date  . REFRACTIVE SURGERY    . TUBAL LIGATION       OB History    No data available       Home Medications    Prior to Admission medications   Medication Sig Start Date End Date Taking? Authorizing Provider  amLODipine (NORVASC) 2.5 MG tablet TAKE 1 TABLET (2.5 MG TOTAL) BY MOUTH DAILY. Patient not taking: Reported on 06/02/2016 07/09/15   Chipper Herb, MD  docusate sodium (COLACE) 100 MG capsule Take 100 mg by mouth 2 (two) times daily.    Historical Provider, MD  famotidine (PEPCID) 20 MG tablet One at bedtime Patient not taking: Reported on 06/02/2016 08/19/15   Tanda Rockers, MD  HYDROmorphone (DILAUDID) 4 MG tablet Take 1-2 tablets (4-8 mg total) by mouth every 4 (four) hours as needed for severe pain. 06/02/16   Ladell Pier, MD  imipramine (TOFRANIL) 50 MG tablet Take 3 tablets (150 mg total) by mouth at bedtime. 02/26/16   Ladell Pier, MD  morphine (MS CONTIN) 15 MG 12 hr tablet Take 1 tablet (15 mg total) by mouth every 12 (twelve) hours. 06/02/16   Ladell Pier, MD  polyethylene glycol powder (GLYCOLAX/MIRALAX) powder Take 1 Container by mouth once.    Historical Provider, MD  prochlorperazine (COMPAZINE) 10 MG tablet TAKE 1 TABLET (10 MG TOTAL) BY MOUTH EVERY 6 (SIX) HOURS AS NEEDED FOR NAUSEA OR VOMITING. Patient not taking: Reported on 06/02/2016 01/14/16   Ladell Pier, MD    Family History Family History  Problem Relation Age of Onset  .  Colon cancer Mother   . Alzheimer's disease Mother   . Heart failure Father     CHF, lived to be 19  . CAD Brother 11  . Heart disease Maternal Grandfather 16  . Heart disease Maternal Grandmother 43    Social History Social History  Substance Use Topics  . Smoking status: Former Smoker    Packs/day: 0.25    Years: 25.00    Types: Cigarettes    Quit date: 08/31/1976  . Smokeless tobacco: Never Used  . Alcohol use 0.6 oz/week    1 Standard drinks or equivalent per week     Allergies   Macrobid [nitrofurantoin macrocrystal] and Ace inhibitors   Review of  Systems Review of Systems  Gastrointestinal: Positive for abdominal distention, abdominal pain, constipation and nausea.  All other systems reviewed and are negative.    Physical Exam Updated Vital Signs BP 149/70 (BP Location: Left Arm)   Pulse 112   Temp 99.1 F (37.3 C) (Oral)   Resp 16   SpO2 95%   Physical Exam  Constitutional: She is oriented to person, place, and time. She appears well-developed and well-nourished. No distress.  HENT:  Head: Normocephalic and atraumatic.  Mouth/Throat: Oropharynx is clear and moist. No oropharyngeal exudate.  Eyes: Conjunctivae and EOM are normal. Right eye exhibits no discharge. Left eye exhibits no discharge. No scleral icterus.  Neck: Normal range of motion. Neck supple.  Cardiovascular: Regular rhythm, normal heart sounds and intact distal pulses.   Tachycardic, HR 108  Pulmonary/Chest: Effort normal and breath sounds normal. No respiratory distress. She has no wheezes. She has no rales. She exhibits no tenderness.  Abdominal: Soft. Bowel sounds are normal. She exhibits distension. She exhibits no mass. There is tenderness (mild diffuse tenderness). There is no rebound and no guarding. No hernia.  No CVA tenderness  Musculoskeletal: Normal range of motion. She exhibits no edema.  No midline C/T/L tenderness  Neurological: She is alert and oriented to person, place, and time.  Skin: Skin is warm and dry. She is not diaphoretic.  Nursing note and vitals reviewed.    ED Treatments / Results  Labs (all labs ordered are listed, but only abnormal results are displayed) Labs Reviewed  CBC WITH DIFFERENTIAL/PLATELET - Abnormal; Notable for the following:       Result Value   WBC 14.8 (*)    RBC 3.38 (*)    Hemoglobin 9.6 (*)    HCT 30.0 (*)    RDW 16.8 (*)    Neutro Abs 12.2 (*)    Monocytes Absolute 1.5 (*)    All other components within normal limits  COMPREHENSIVE METABOLIC PANEL - Abnormal; Notable for the following:     Sodium 134 (*)    Glucose, Bld 120 (*)    Calcium 7.9 (*)    Total Protein 5.5 (*)    Albumin 2.4 (*)    All other components within normal limits  LACTATE DEHYDROGENASE, BODY FLUID - Abnormal; Notable for the following:    LD, Fluid 295 (*)    All other components within normal limits  BODY FLUID CELL COUNT WITH DIFFERENTIAL - Abnormal; Notable for the following:    Color, Fluid 1591 (*)    Appearance, Fluid YELLOW (*)    Neutrophil Count, Fluid 62 (*)    Monocyte-Macrophage-Serous Fluid 17 (*)    All other components within normal limits  BODY FLUID CULTURE  LIPASE, BLOOD  GLUCOSE, PERITONEAL FLUID  PROTEIN, BODY FLUID  ALBUMIN, FLUID  PATHOLOGIST SMEAR REVIEW  CBC  BASIC METABOLIC PANEL    EKG  EKG Interpretation None       Radiology Ct Abdomen Pelvis W Contrast  Result Date: 06/06/2016 CLINICAL DATA:  Pt states she has been unable to have b.m. X 10 days, in spite of laxatives, including mg citrate. She is in no distress. She also tells me she underwent paracentesis about a week ago and that she has pancreatic and liver cancer. EXAM: CT ABDOMEN AND PELVIS WITH CONTRAST TECHNIQUE: Multidetector CT imaging of the abdomen and pelvis was performed using the standard protocol following bolus administration of intravenous contrast. CONTRAST:  100 mL of Isovue 370 intravenous contrast. COMPARISON:  CT, 05/18/2016 FINDINGS: Lower chest: Moderate left and small right pleural effusions. There is dependent opacity most evident in the left lower lobe, consistent with atelectasis. Pneumonia is possible. No pulmonary edema. Heart normal in size. Hepatobiliary: Low-density liver lesions, largest arising from the posterior segment right lobe, measuring 2.7 cm, increased from 2.2 cm on the prior CT. No convincing new liver lesions. Gallbladder is distended but otherwise unremarkable. No bile duct dilation. Pancreas: Atrophic. Ill-defined heterogeneous mass arises from the talus contiguous with  the medial inferior spleen. This is stable. Spleen: Heterogeneous attenuation with areas of low attenuation along the anterior and inferior aspect of the spleen. This is likely from areas of infarction. It is stable from the prior CT. Spleen normal in overall size. Adrenals/Urinary Tract: No adrenal masses. Kidneys, ureters and bladder are unremarkable. Stomach/Bowel: There is moderate-to-marked increased stool in the colon. This distends colon, most notably the ascending colon and cecum. Lower ascending colon measures 7.5 cm in greatest diameter. No colonic wall thickening. No inflammatory changes. Stomach and small bowel are unremarkable. Vascular/Lymphatic: No discrete enlarged lymph nodes. Minor atherosclerotic calcifications along the infrarenal abdominal aorta. Reproductive: Small enhancing focus in the upper uterus, likely a fibroid. Uterus otherwise unremarkable. No adnexal masses. Other: Moderate amount of ascites. There is enhancement of the peritoneal margins in the cul-de-sac without a discrete nodule or mass. Musculoskeletal: No osteoblastic or osteolytic lesions. IMPRESSION: 1. Moderate-to-marked increased stool in the colon. No evidence of bowel obstruction or bowel inflammation. 2. Moderate amount of ascites. Peritoneal carcinomatosis is suspected although no discrete peritoneal mass is visualized. Ascites has increased when compared to the prior CT. 3. Liver masses consistent with liver metastatic disease. The largest in the posterior segment of the right lobe measures 2.7 cm, increased from 2.2 cm on the prior CT. 4. The pancreatic tail ill-defined mass is stable. Abnormal attenuation in the spleen, which may be from areas of infarction, is also stable. 5. Moderate left and small right pleural effusions. Associated lower lobe parenchymal opacity, most evident in the left lower lobe, most likely atelectasis. These findings have increased since the prior study. Electronically Signed   By: Lajean Manes M.D.   On: 06/06/2016 15:22    Procedures .Paracentesis Date/Time: 06/06/2016 2:02 PM Performed by: Nona Dell Authorized by: Nona Dell   Consent:    Consent obtained:  Written   Consent given by:  Patient   Risks discussed:  Bleeding, bowel perforation, infection and pain Pre-procedure details:    Procedure purpose:  Diagnostic   Preparation: Patient was prepped and draped in usual sterile fashion   Anesthesia (see MAR for exact dosages):    Anesthesia method:  Local infiltration   Local anesthetic:  Lidocaine 2% WITH epi Procedure details:    Needle gauge:  18  Ultrasound guidance: yes     Puncture site:  R lower quadrant   Fluid removed amount:  35cc   Fluid appearance:  Clear and yellow   Dressing:  Adhesive bandage Post-procedure details:    Patient tolerance of procedure:  Tolerated well, no immediate complications    (including critical care time)  Medications Ordered in ED Medications  cefTRIAXone (ROCEPHIN) 2 g in dextrose 5 % 50 mL IVPB (not administered)  docusate sodium (COLACE) capsule 100 mg (not administered)  HYDROmorphone (DILAUDID) tablet 4-8 mg (not administered)  morphine (MS CONTIN) 12 hr tablet 15 mg (not administered)  imipramine (TOFRANIL) tablet 150 mg (not administered)  enoxaparin (LOVENOX) injection 40 mg (not administered)  0.9 %  sodium chloride infusion (not administered)  lidocaine-EPINEPHrine (XYLOCAINE W/EPI) 2 %-1:200000 (PF) injection 10 mL (10 mLs Intradermal Given 06/06/16 1148)  iopamidol (ISOVUE-300) 61 % injection 30 mL (30 mLs Oral Contrast Given 06/06/16 1148)  iopamidol (ISOVUE-300) 61 % injection 100 mL (100 mLs Intravenous Contrast Given 06/06/16 1452)  milk and molasses enema (250 mLs Rectal Given 06/06/16 1649)     Initial Impression / Assessment and Plan / ED Course  I have reviewed the triage vital signs and the nursing notes.  Pertinent labs & imaging results that were available  during my care of the patient were reviewed by me and considered in my medical decision making (see chart for details).  Clinical Course    Patient presents with constipation for the past 10 days. Hx of pancreatic cancer, restarting chemotherapy next week. She also reports having associated diffuse abdominal pain and distention. No relief with Colace or magnesium citrate at home. Reports taking Dilaudid at home. Denies fever. Reports having a therapeutic paracentesis performed on 10/3. Vitals showed HR 114, remaining vitals stable. Exam revealed abdominal distention with diffuse abdominal tenderness. Remaining exam unremarkable. Patient declined pain medications at this time.  WBC 14.8. Remaining labs unremarkable. Discussed case with Dr. Thomasene Lot who evaluated the patient. Plan to perform diagnostic paracentesis due to concern for SBP. Will order CT abdomen to evaluate for small bowel obstruction. Paracentesis performed without any, palpitations, labs sent. CT abdomen negative for bowel obstruction, moderate amount of ascites. Peritoneal fluid LDH 295, WBCs 1591, neutrophil 62. Plan to admit patient for SBP. Consultation hospitalist, Dr. Alcario Drought agrees to admission. Orders placed for inpatient telemetry bed. Discussed results and plan for admission with patient.  Final Clinical Impressions(s) / ED Diagnoses   Final diagnoses:  SBP (spontaneous bacterial peritonitis) [facility])    New Prescriptions New Prescriptions   No medications on file     Nona Dell, PA-C 06/06/16 [place], MD 06/08/16 [id number]

## 2016-06-06 NOTE — ED Notes (Signed)
Report given to 4W RN.  Hospitialist in conference with family to inform of new information with the permission of patient.

## 2016-06-06 NOTE — ED Notes (Signed)
Patient does not want pain medication until she has food on her stomach.

## 2016-06-06 NOTE — ED Triage Notes (Signed)
She states she has been unable to have b.m. X 10 days, in spite of laxatives, including mg citrate.  She is in no distress. She also tells me she underwent paracentesis about a week ago and that she has pancreatic and liver cancer(s).

## 2016-06-06 NOTE — H&P (Signed)
History and Physical    Wendy Palmer [id number] DOB: 03/02/1951 DOA: 06/06/2016   PCP: Redge Gainer, MD Chief Complaint:  Chief Complaint  Patient presents with  . Constipation  . Pancreatic Cancer    HPI: Wendy Palmer is a 65 y.o. female with medical history significant of stage 4 pancreatic cancer with liver mets.  Patient had ascites that was tapped on the 3rd to see if she had developed peritoneal carcinomatosis.  She presents to the ED with c/o abdominal pain, distention, no BM in past 10 days.  Not improved with daily colace for past 5 days and even half bottle of mag citrate yesterday didn't help.  She is taking home pain meds.  Including MS contin, norco, dilaudid.  ED Course: Has BM after enema, pain significantly improved.  HR 110s, Tm 99, WBC 13k, diagnostic paracentesis shows 1300 WBC.  Started on rocephin for presumed SBP.  Review of Systems: As per HPI otherwise 10 point review of systems negative.    Past Medical History:  Diagnosis Date  . Cancer [facility])    Pancreatic  . Depression   . Diverticulosis   . Hypertension   . Patella fracture     Past Surgical History:  Procedure Laterality Date  . REFRACTIVE SURGERY    . TUBAL LIGATION       reports that she quit smoking about 39 years ago. Her smoking use included Cigarettes. She has a 6.25 pack-year smoking history. She has never used smokeless tobacco. She reports that she drinks about 0.6 oz of alcohol per week . She reports that she does not use drugs.  Allergies  Allergen Reactions  . Macrobid [Nitrofurantoin Macrocrystal] Rash  . Ace Inhibitors Cough    Family History  Problem Relation Age of Onset  . Colon cancer Mother   . Alzheimer's disease Mother   . Heart failure Father     CHF, lived to be 78  . CAD Brother 80  . Heart disease Maternal Grandfather 41  . Heart disease Maternal Grandmother 50      Prior to Admission medications   Medication Sig Start Date End Date Taking?  Authorizing Provider  docusate sodium (COLACE) 100 MG capsule Take 100 mg by mouth 2 (two) times daily.   Yes Historical Provider, MD  HYDROmorphone (DILAUDID) 4 MG tablet Take 1-2 tablets (4-8 mg total) by mouth every 4 (four) hours as needed for severe pain. 06/02/16  Yes Ladell Pier, MD  ibuprofen (ADVIL,MOTRIN) 200 MG tablet Take 200 mg by mouth every 6 (six) hours as needed for headache, mild pain or moderate pain.   Yes Historical Provider, MD  imipramine (TOFRANIL) 50 MG tablet Take 3 tablets (150 mg total) by mouth at bedtime. 02/26/16  Yes Ladell Pier, MD  magnesium citrate SOLN Take 0.5 Bottles by mouth once.   Yes Historical Provider, MD  morphine (MS CONTIN) 15 MG 12 hr tablet Take 1 tablet (15 mg total) by mouth every 12 (twelve) hours. 06/02/16  Yes Ladell Pier, MD  polyethylene glycol [facility] / GLYCOLAX) packet Take 17 g by mouth daily as needed for mild constipation or moderate constipation.   Yes Historical Provider, MD  amLODipine (NORVASC) 2.5 MG tablet TAKE 1 TABLET (2.5 MG TOTAL) BY MOUTH DAILY. Patient not taking: Reported on 06/06/2016 07/09/15   Chipper Herb, MD  famotidine (PEPCID) 20 MG tablet One at bedtime Patient not taking: Reported on 06/06/2016 08/19/15   Tanda Rockers, MD  prochlorperazine (COMPAZINE) 10  MG tablet TAKE 1 TABLET (10 MG TOTAL) BY MOUTH EVERY 6 (SIX) HOURS AS NEEDED FOR NAUSEA OR VOMITING. Patient not taking: Reported on 06/06/2016 01/14/16   Ladell Pier, MD    Physical Exam: Vitals:   06/06/16 1126 06/06/16 1508 06/06/16 1801 06/06/16 2030  BP: 148/68 151/62 149/70 152/80  Pulse: 108 105 112 119  Resp: 18 16 16 23   Temp: 98.7 F (37.1 C) 98.7 F (37.1 C) 99.1 F (37.3 C)   TempSrc: Oral Oral Oral   SpO2: 95% 92% 95% 100%      Constitutional: NAD, calm, comfortable Eyes: PERRL, lids and conjunctivae normal ENMT: Mucous membranes are moist. Posterior pharynx clear of any exudate or lesions.Normal dentition.  Neck: normal,  supple, no masses, no thyromegaly Respiratory: clear to auscultation bilaterally, no wheezing, no crackles. Normal respiratory effort. No accessory muscle use.  Cardiovascular: Regular rate and rhythm, no murmurs / rubs / gallops. No extremity edema. 2+ pedal pulses. No carotid bruits.  Abdomen: no tenderness, no masses palpated. No hepatosplenomegaly. Bowel sounds positive.  Musculoskeletal: no clubbing / cyanosis. No joint deformity upper and lower extremities. Good ROM, no contractures. Normal muscle tone.  Skin: no rashes, lesions, ulcers. No induration Neurologic: CN 2-12 grossly intact. Sensation intact, DTR normal. Strength 5/5 in all 4.  Psychiatric: Normal judgment and insight. Alert and oriented x 3. Normal mood.    Labs on Admission: I have personally reviewed following labs and imaging studies  CBC:  Recent Labs Lab 06/06/16 1143  WBC 14.8*  NEUTROABS 12.2*  HGB 9.6*  HCT 30.0*  MCV 88.8  PLT [id number]   Basic Metabolic Panel:  Recent Labs Lab 06/06/16 1143  NA 134*  K 4.7  CL 101  CO2 26  GLUCOSE 120*  BUN 17  CREATININE 0.50  CALCIUM 7.9*   GFR: Estimated Creatinine Clearance: 54 mL/min (by C-G formula based on SCr of 0.5 mg/dL). Liver Function Tests:  Recent Labs Lab 06/06/16 1143  AST 17  ALT 14  ALKPHOS 125  BILITOT 0.7  PROT 5.5*  ALBUMIN 2.4*    Recent Labs Lab 06/06/16 1143  LIPASE 14   No results for input(s): AMMONIA in the last 168 hours. Coagulation Profile: No results for input(s): INR, PROTIME in the last 168 hours. Cardiac Enzymes: No results for input(s): CKTOTAL, CKMB, CKMBINDEX, TROPONINI in the last 168 hours. BNP (last 3 results) No results for input(s): PROBNP in the last 8760 hours. HbA1C: No results for input(s): HGBA1C in the last 72 hours. CBG: No results for input(s): GLUCAP in the last 168 hours. Lipid Profile: No results for input(s): CHOL, HDL, LDLCALC, TRIG, CHOLHDL, LDLDIRECT in the last 72 hours. Thyroid  Function Tests: No results for input(s): TSH, T4TOTAL, FREET4, T3FREE, THYROIDAB in the last 72 hours. Anemia Panel: No results for input(s): VITAMINB12, FOLATE, FERRITIN, TIBC, IRON, RETICCTPCT in the last 72 hours. Urine analysis:    Component Value Date/Time   BILIRUBINUR neg 04/03/2015 1057   PROTEINUR neg 04/03/2015 1057   UROBILINOGEN negative 04/03/2015 1057   NITRITE neg 04/03/2015 1057   LEUKOCYTESUR Negative 04/03/2015 1057   Sepsis Labs: @LABRCNTIP (procalcitonin:4,lacticidven:4) ) Recent Results (from the past 240 hour(s))  Body fluid culture     Status: None   Collection Time: 06/02/16 12:00 PM  Result Value Ref Range Status   Specimen Description PERITONEAL  Final   Special Requests NONE  Final   Gram Stain   Final    MODERATE WBC PRESENT, PREDOMINANTLY PMN NO ORGANISMS  SEEN    Culture   Final    NO GROWTH 3 DAYS Performed at [facility]    Report Status 06/06/2016 FINAL  Final  Body fluid culture     Status: None (Preliminary result)   Collection Time: 06/06/16  1:40 PM  Result Value Ref Range Status   Specimen Description PERITONEAL CAVITY  Final   Special Requests NONE  Final   Gram Stain   Final    WBC PRESENT,BOTH PMN AND MONONUCLEAR NO ORGANISMS SEEN CYTOSPIN SMEAR Performed at [facility]    Culture PENDING  Incomplete   Report Status PENDING  Incomplete     Radiological Exams on Admission: Ct Abdomen Pelvis W Contrast  Result Date: 06/06/2016 CLINICAL DATA:  Pt states she has been unable to have b.m. X 10 days, in spite of laxatives, including mg citrate. She is in no distress. She also tells me she underwent paracentesis about a week ago and that she has pancreatic and liver cancer. EXAM: CT ABDOMEN AND PELVIS WITH CONTRAST TECHNIQUE: Multidetector CT imaging of the abdomen and pelvis was performed using the standard protocol following bolus administration of intravenous contrast. CONTRAST:  100 mL of Isovue 370 intravenous  contrast. COMPARISON:  CT, 05/18/2016 FINDINGS: Lower chest: Moderate left and small right pleural effusions. There is dependent opacity most evident in the left lower lobe, consistent with atelectasis. Pneumonia is possible. No pulmonary edema. Heart normal in size. Hepatobiliary: Low-density liver lesions, largest arising from the posterior segment right lobe, measuring 2.7 cm, increased from 2.2 cm on the prior CT. No convincing new liver lesions. Gallbladder is distended but otherwise unremarkable. No bile duct dilation. Pancreas: Atrophic. Ill-defined heterogeneous mass arises from the talus contiguous with the medial inferior spleen. This is stable. Spleen: Heterogeneous attenuation with areas of low attenuation along the anterior and inferior aspect of the spleen. This is likely from areas of infarction. It is stable from the prior CT. Spleen normal in overall size. Adrenals/Urinary Tract: No adrenal masses. Kidneys, ureters and bladder are unremarkable. Stomach/Bowel: There is moderate-to-marked increased stool in the colon. This distends colon, most notably the ascending colon and cecum. Lower ascending colon measures 7.5 cm in greatest diameter. No colonic wall thickening. No inflammatory changes. Stomach and small bowel are unremarkable. Vascular/Lymphatic: No discrete enlarged lymph nodes. Minor atherosclerotic calcifications along the infrarenal abdominal aorta. Reproductive: Small enhancing focus in the upper uterus, likely a fibroid. Uterus otherwise unremarkable. No adnexal masses. Other: Moderate amount of ascites. There is enhancement of the peritoneal margins in the cul-de-sac without a discrete nodule or mass. Musculoskeletal: No osteoblastic or osteolytic lesions. IMPRESSION: 1. Moderate-to-marked increased stool in the colon. No evidence of bowel obstruction or bowel inflammation. 2. Moderate amount of ascites. Peritoneal carcinomatosis is suspected although no discrete peritoneal mass is  visualized. Ascites has increased when compared to the prior CT. 3. Liver masses consistent with liver metastatic disease. The largest in the posterior segment of the right lobe measures 2.7 cm, increased from 2.2 cm on the prior CT. 4. The pancreatic tail ill-defined mass is stable. Abnormal attenuation in the spleen, which may be from areas of infarction, is also stable. 5. Moderate left and small right pleural effusions. Associated lower lobe parenchymal opacity, most evident in the left lower lobe, most likely atelectasis. These findings have increased since the prior study. Electronically Signed   By: Lajean Manes M.D.   On: 06/06/2016 15:22    EKG: Independently reviewed.  Assessment/Plan Principal Problem:  SBP (spontaneous bacterial peritonitis) ([place]) Active Problems:   Cancer of pancreas, tail (HCC)   Constipation    1. SBP - 1. Rocephin 2. Culture pending 3. IVF 4. Tele monitor for tachycardia 5. Repeat CBC in AM 2. Metastatic pancreatic cancer - 1. Long discussion with patient and family, especially given the increase in size of liver lesion and poor long term prognosis of metastatic pancreatic cancer 2. I havent informed them yet (as I just now see the results) that the 10/3 tap also unfortunately shows "atypical cells suspicious for metastatic adenocarcinoma". 3. Plan is to try salvage chemo starting with next round 4. Patient does wish to be DNI/DNR 3. Constipation - 1. Resolved in ED with BM 2. Starting senna tomorrow   DVT prophylaxis: Lovenox Code Status: DNR Family Communication: Sister at bedside, and daughter over phone Consults called: put consult into epic for oncology Admission status: Admit to inpatient   Etta Quill DO [facility] Pager [phone] from 7PM-7AM  If 7AM-7PM, please contact the day physician for the patient [email or web address] Password [email or web address]  06/06/2016, 8:51 PM

## 2016-06-07 ENCOUNTER — Other Ambulatory Visit: Payer: Self-pay | Admitting: Oncology

## 2016-06-07 ENCOUNTER — Encounter (HOSPITAL_COMMUNITY): Payer: Self-pay | Admitting: *Deleted

## 2016-06-07 DIAGNOSIS — E43 Unspecified severe protein-calorie malnutrition: Secondary | ICD-10-CM

## 2016-06-07 DIAGNOSIS — K5903 Drug induced constipation: Secondary | ICD-10-CM

## 2016-06-07 LAB — BASIC METABOLIC PANEL
Anion gap: 9 (ref 5–15)
BUN: 19 mg/dL (ref 6–20)
CALCIUM: 8.8 mg/dL — AB (ref 8.9–10.3)
CO2: 27 mmol/L (ref 22–32)
Chloride: 96 mmol/L — ABNORMAL LOW (ref 101–111)
Creatinine, Ser: 0.65 mg/dL (ref 0.44–1.00)
GFR calc Af Amer: >60 mL/min (ref >60)
GLUCOSE: 124 mg/dL — AB (ref 65–99)
POTASSIUM: 4.8 mmol/L (ref 3.5–5.1)
Sodium: 132 mmol/L — ABNORMAL LOW (ref 135–145)

## 2016-06-07 LAB — CBC
HCT: 32.9 % — ABNORMAL LOW (ref 36.0–46.0)
HEMOGLOBIN: 10.4 g/dL — AB (ref 12.0–15.0)
MCH: 28.7 pg (ref 26.0–34.0)
MCHC: 31.6 g/dL (ref 30.0–36.0)
MCV: 90.6 fL (ref 78.0–100.0)
PLATELETS: 371 10*3/uL (ref 150–400)
RBC: 3.63 MIL/uL — ABNORMAL LOW (ref 3.87–5.11)
RDW: 17 % — AB (ref 11.5–15.5)
WBC: 13.8 10*3/uL — ABNORMAL HIGH (ref 4.0–10.5)

## 2016-06-07 MED ORDER — TEMAZEPAM 7.5 MG PO CAPS
7.5000 mg | ORAL_CAPSULE | Freq: Every evening | ORAL | Status: DC | PRN
  Filled 2016-06-07 (×3): qty 1

## 2016-06-07 MED ORDER — SODIUM CHLORIDE 0.9% FLUSH: 10.0000 mL | INTRAVENOUS | Status: DC | PRN

## 2016-06-07 MED ORDER — SENNOSIDES-DOCUSATE SODIUM 8.6-50 MG PO TABS
2.0000 | ORAL_TABLET | Freq: Two times a day (BID) | ORAL | Status: DC
  Administered 2016-06-07 – 2016-06-08 (×3): 2 via ORAL
  Filled 2016-06-07 (×3): qty 2

## 2016-06-07 MED ORDER — POLYETHYLENE GLYCOL 3350 17 G PO PACK
17.0000 g | PACK | Freq: Two times a day (BID) | ORAL | Status: DC
  Administered 2016-06-07 – 2016-06-08 (×3): 17 g via ORAL
  Filled 2016-06-07 (×3): qty 1

## 2016-06-07 MED ORDER — HYDROCODONE-ACETAMINOPHEN 5-325 MG PO TABS
2.0000 | ORAL_TABLET | ORAL | Status: DC | PRN
  Administered 2016-06-07 – 2016-06-08 (×2): 2 via ORAL
  Filled 2016-06-07 (×2): qty 2

## 2016-06-07 NOTE — Progress Notes (Signed)
PROGRESS NOTE                                                                                                                                                                                                             Patient Demographics:    Wendy Palmer, is a 65 y.o. female, DOB - 03/28/1951, [id number]  Admit date - 06/06/2016   Admitting Physician Etta Quill, DO  Outpatient Primary MD for the patient is Redge Gainer, MD  LOS - 1  Outpatient Specialists:  Dr. Learta Codding  Chief Complaint  Patient presents with  . Constipation  . Pancreatic Cancer       Brief Narrative   65 year old female with stage IV pancreatic cancer with liver metastases, recent paracentesis showing peritoneal carcinomatosis presented to the ED with abdominal pain with distention and being constipated for past 10 days unimproved with home dose of Colace and magnesium citrate. In the ED CT scan done showed moderate ascites fluid with significant stool burden. Patient was given an enema with significant bowel movement. She had low-grade temperature of 99 F, heart rate of 110s. Blood work showed WBC of 13 K and diagnostic paracentesis showed 1300 WBC. Patient started on Rocephin for SBP and admitted to hospitalist service.   Subjective:   Patient feels better after bowel movement was still has some epigastric and supraumbilical pain.   Assessment  & Plan :    Principal Problem:   SBP (spontaneous bacterial peritonitis) (HCC) Empiric Rocephin. Follow cultures. Supportive care with Tylenol and pain medications  Active Problems: Metastatic pancreatic cancer Now has peritoneal carcinomatosis. Continue home pain regimen that includes Dilaudid when necessary and MS Contin twice a day. I will increase her Vicodin dose and frequency.  Severe constipation Improved somewhat with enema on admission. I will increase her MiraLAX and senna-Colace  to schedule twice a day.  Severe protein calorie malnutrition Dietitian consulted.    Code Status : DO NOT RESUSCITATE  Family Communication  : None at bedside  Disposition Plan  : Home tomorrow if symptoms better and peritoneal fluid culture obtained  Barriers For Discharge : Active symptoms  Consults  :  Dr. Learta Codding will be consulted in the morning  Procedures  : CT abdomen  DVT Prophylaxis  :  Lovenox -  Lab Results  Component Value Date   PLT 371  06/07/2016    Antibiotics  :    Anti-infectives    Start     Dose/Rate Route Frequency Ordered Stop   06/06/16 1915  cefTRIAXone (ROCEPHIN) 2 g in dextrose 5 % 50 mL IVPB     2 g 100 mL/hr over 30 Minutes Intravenous Every 24 hours 06/06/16 1905          Objective:   Vitals:   06/06/16 1801 06/06/16 2030 06/06/16 2113 06/07/16 0608  BP: 149/70 152/80 (!) 167/97 (!) 149/61  Pulse: 112 119 (!) 112 (!) 113  Resp: 16 23 15 20   Temp: 99.1 F (37.3 C)  99.1 F (37.3 C) 98.4 F (36.9 C)  TempSrc: Oral  Oral Oral  SpO2: 95% 100% 100% 98%  Weight:   50.2 kg (110 lb 10.7 oz)   Height:   5\' 2"  (1.575 m)     Wt Readings from Last 3 Encounters:  06/06/16 50.2 kg (110 lb 10.7 oz)  06/02/16 52 kg (114 lb 11.2 oz)  05/26/16 49.2 kg (108 lb 8 oz)     Intake/Output Summary (Last 24 hours) at 06/07/16 1232 Last data filed at 06/07/16 1055  Gross per 24 hour  Intake           741.25 ml  Output              450 ml  Net           291.25 ml     Physical Exam  Gen: not in distress HEENT: Temporal wasting, moist mucosa, supple neck Chest: clear b/l, no added sounds CVS: N S1&S2, no murmurs,  GI: soft, distention, mild epigastric and supra umbilical tenderness, bowel sounds present Musculoskeletal: warm, no edema     Data Review:    CBC  Recent Labs Lab 06/06/16 1143 06/07/16 0533  WBC 14.8* 13.8*  HGB 9.6* 10.4*  HCT 30.0* 32.9*  PLT 330 371  MCV 88.8 90.6  MCH 28.4 28.7  MCHC 32.0 31.6  RDW 16.8*  17.0*  LYMPHSABS 0.9  --   MONOABS 1.5*  --   EOSABS 0.2  --   BASOSABS 0.0  --     Chemistries   Recent Labs Lab 06/06/16 1143 06/07/16 0533  NA 134* 132*  K 4.7 4.8  CL 101 96*  CO2 26 27  GLUCOSE 120* 124*  BUN 17 19  CREATININE 0.50 0.65  CALCIUM 7.9* 8.8*  AST 17  --   ALT 14  --   ALKPHOS 125  --   BILITOT 0.7  --    ------------------------------------------------------------------------------------------------------------------ No results for input(s): CHOL, HDL, LDLCALC, TRIG, CHOLHDL, LDLDIRECT in the last 72 hours.  No results found for: HGBA1C ------------------------------------------------------------------------------------------------------------------ No results for input(s): TSH, T4TOTAL, T3FREE, THYROIDAB in the last 72 hours.  Invalid input(s): FREET3 ------------------------------------------------------------------------------------------------------------------ No results for input(s): VITAMINB12, FOLATE, FERRITIN, TIBC, IRON, RETICCTPCT in the last 72 hours.  Coagulation profile No results for input(s): INR, PROTIME in the last 168 hours.  No results for input(s): DDIMER in the last 72 hours.  Cardiac Enzymes No results for input(s): CKMB, TROPONINI, MYOGLOBIN in the last 168 hours.  Invalid input(s): CK ------------------------------------------------------------------------------------------------------------------ No results found for: BNP  Inpatient Medications  Scheduled Meds: . cefTRIAXone (ROCEPHIN)  IV  2 g Intravenous Q24H  . enoxaparin (LOVENOX) injection  40 mg Subcutaneous Q24H  . imipramine  150 mg Oral QHS  . morphine  15 mg Oral Q12H  . polyethylene glycol  17 g Oral BID  .  senna-docusate  2 tablet Oral BID   Continuous Infusions: . sodium chloride 75 mL/hr at 06/07/16 1055   PRN Meds:.HYDROcodone-acetaminophen, HYDROmorphone, sodium chloride flush  Micro Results Recent Results (from the past 240 hour(s))  Body  fluid culture     Status: None   Collection Time: 06/02/16 12:00 PM  Result Value Ref Range Status   Specimen Description PERITONEAL  Final   Special Requests NONE  Final   Gram Stain   Final    MODERATE WBC PRESENT, PREDOMINANTLY PMN NO ORGANISMS SEEN    Culture   Final    NO GROWTH 3 DAYS Performed at [facility]    Report Status 06/06/2016 FINAL  Final  Body fluid culture     Status: None (Preliminary result)   Collection Time: 06/06/16  1:40 PM  Result Value Ref Range Status   Specimen Description PERITONEAL CAVITY  Final   Special Requests NONE  Final   Gram Stain   Final    WBC PRESENT,BOTH PMN AND MONONUCLEAR NO ORGANISMS SEEN CYTOSPIN SMEAR    Culture   Final    NO GROWTH < 24 HOURS Performed at [facility]    Report Status PENDING  Incomplete    Radiology Reports Ct Chest W Contrast  Addendum Date: 05/21/2016   ADDENDUM REPORT: 05/21/2016 14:12 ADDENDUM: Target Lesions: 1. Right hepatic lobe lesion measures 0.8 cm (image 27 series 2) today compared to 0.9 cm on 04/03/16. 2. Pancreatic tail mass measures 3.3 cm today (image 21 series 2) compared to 3.3 cm on 04/03/16. Non-target Lesions: 1. Multiple hepatic metastases -including posterior right lobe of liver lesion measuring 2.2 cm, image 57 of series 4. Compared with 0.8 cm on 04/03/2016 2. Gastrohepatic ligament lymphadenopathy (image 18 series 2) -present 3. Retroperitoneal lymphadenopathy (image 21 series 2)-present 4.  Persistent bilateral pleural effusions compared with 04/03/2016. Electronically Signed   By: Kerby Moors M.D.   On: 05/21/2016 14:12   Addendum Date: 05/21/2016   ADDENDUM REPORT: 05/21/2016 09:11 ADDENDUM: Hepatobiliary: Metastasis within the posterior right lobe of liver measures 2.2 cm, image 57 of series 4. Previously 0.9 cm. Stable 0.08 cm central right lobe of liver metastasis, image 54 of series 4. Electronically Signed   By: Kerby Moors M.D.   On: 05/21/2016 09:11   Result  Date: 05/21/2016 CLINICAL DATA:  Pancreas cancer. EXAM: CT CHEST, ABDOMEN, AND PELVIS WITH CONTRAST TECHNIQUE: Multidetector CT imaging of the chest, abdomen and pelvis was performed following the standard protocol during bolus administration of intravenous contrast. CONTRAST:  148mL ISOVUE-300 IOPAMIDOL (ISOVUE-300) INJECTION 61% COMPARISON:  03/10/2016 RECIST 1.1 Target Lesions: 1. Right hepatic lobe lesion measures 0.8 cm (image 54 series 4 ) today compared to 0.8 cm on 03/10/2016. 2. Pancreatic tail mass measures 3.2 cm today (image 59 of series 4) compared to 3.8 cm previously. Non-target Lesions: 1. Multiple hepatic metastases -present 2. Gastrohepatic ligament lymphadenopathy (image 55 of series 4) -present 3. Retroperitoneal lymphadenopathy (image 60 series 4)-present FINDINGS: CT CHEST FINDINGS Cardiovascular: Heart size is normal. No pericardial effusion identified. Mediastinum/Nodes: The trachea appears patent and is midline. Normal appearance of the esophagus. Right paratracheal lymph node measures 0.8 cm, image 20 of series 4, previously 0.7 cm. Lungs/Pleura: Small bilateral pleural effusions are identified. Pulmonary nodule within the right upper lobe measures 6 mm, image 49 of series 7. Previously this measured 3 mm. Left upper lobe lung nodule is stable measuring 3 mm, image 48 of series 7. Ground scratch set patchy areas  of ground-glass attenuation are identified within the right upper low, image 36 of series 7. Similar appearance of scar like density in the right middle low, image 92 of series 7. Musculoskeletal: No chest wall mass or suspicious bone lesions identified. CT ABDOMEN PELVIS FINDINGS Hepatobiliary: Metastasis within the posterior right lobe of liver measures 2.2 cm, image 57 of series 4. Previously 0.9 cm stable 8 mm central right lobe of liver metastasis, image 54 of series 4. Pancreas: Mass within the tail of pancreas measures 3.3 cm, image 59 36 of series 2. This is compared with  3.8 cm previously. Spleen: Perfusion abnormality involving the spleen appears similar to previous exam. This likely reflects tumor involvement of the splenic artery. Adrenals/Urinary Tract: The adrenal glands are normal. Unremarkable appearance of both kidneys. The urinary bladder appears normal. Stomach/Bowel: The stomach and the small bowel loops have a normal course and caliber. There is no evidence for a bowel obstruction. Vascular/Lymphatic: Aortic atherosclerosis noted. Measures 1.6 x 2.9 cm, image 39 of series 2. On the previous exam this measured 1.2 x 1.2 cm. This is compared with the same previously. The index gastrohepatic ligament lymph node Measures 9 mm, image 55 of series 4. Previously this measured 8 mm. Reproductive: Uterus and bilateral adnexa are unremarkable. Other: There is a moderate amount sub pelvic ascites. This is new from previous exam. Musculoskeletal: No aggressive lytic or sclerotic bone lesions identified. IMPRESSION: 1. Pulmonary nodule within the right upper lobe has increased in size from previous exam and is worrisome for progression of metastatic disease. 2. Posterior right lobe of liver metastasis demonstrates significant interval increase in size from previous exam. 3. Apparent decrease in size of pancreatic tail mass. 4. Increase in size of periaortic adenopathy. 5. New bilateral pleural effusions. Electronically Signed: By: Kerby Moors M.D. On: 05/18/2016 16:57   Ct Abdomen Pelvis W Contrast  Result Date: 06/06/2016 CLINICAL DATA:  Pt states she has been unable to have b.m. X 10 days, in spite of laxatives, including mg citrate. She is in no distress. She also tells me she underwent paracentesis about a week ago and that she has pancreatic and liver cancer. EXAM: CT ABDOMEN AND PELVIS WITH CONTRAST TECHNIQUE: Multidetector CT imaging of the abdomen and pelvis was performed using the standard protocol following bolus administration of intravenous contrast. CONTRAST:  100  mL of Isovue 370 intravenous contrast. COMPARISON:  CT, 05/18/2016 FINDINGS: Lower chest: Moderate left and small right pleural effusions. There is dependent opacity most evident in the left lower lobe, consistent with atelectasis. Pneumonia is possible. No pulmonary edema. Heart normal in size. Hepatobiliary: Low-density liver lesions, largest arising from the posterior segment right lobe, measuring 2.7 cm, increased from 2.2 cm on the prior CT. No convincing new liver lesions. Gallbladder is distended but otherwise unremarkable. No bile duct dilation. Pancreas: Atrophic. Ill-defined heterogeneous mass arises from the talus contiguous with the medial inferior spleen. This is stable. Spleen: Heterogeneous attenuation with areas of low attenuation along the anterior and inferior aspect of the spleen. This is likely from areas of infarction. It is stable from the prior CT. Spleen normal in overall size. Adrenals/Urinary Tract: No adrenal masses. Kidneys, ureters and bladder are unremarkable. Stomach/Bowel: There is moderate-to-marked increased stool in the colon. This distends colon, most notably the ascending colon and cecum. Lower ascending colon measures 7.5 cm in greatest diameter. No colonic wall thickening. No inflammatory changes. Stomach and small bowel are unremarkable. Vascular/Lymphatic: No discrete enlarged lymph nodes. Minor atherosclerotic calcifications  along the infrarenal abdominal aorta. Reproductive: Small enhancing focus in the upper uterus, likely a fibroid. Uterus otherwise unremarkable. No adnexal masses. Other: Moderate amount of ascites. There is enhancement of the peritoneal margins in the cul-de-sac without a discrete nodule or mass. Musculoskeletal: No osteoblastic or osteolytic lesions. IMPRESSION: 1. Moderate-to-marked increased stool in the colon. No evidence of bowel obstruction or bowel inflammation. 2. Moderate amount of ascites. Peritoneal carcinomatosis is suspected although no  discrete peritoneal mass is visualized. Ascites has increased when compared to the prior CT. 3. Liver masses consistent with liver metastatic disease. The largest in the posterior segment of the right lobe measures 2.7 cm, increased from 2.2 cm on the prior CT. 4. The pancreatic tail ill-defined mass is stable. Abnormal attenuation in the spleen, which may be from areas of infarction, is also stable. 5. Moderate left and small right pleural effusions. Associated lower lobe parenchymal opacity, most evident in the left lower lobe, most likely atelectasis. These findings have increased since the prior study. Electronically Signed   By: Lajean Manes M.D.   On: 06/06/2016 15:22   Ct Abdomen Pelvis W Contrast  Addendum Date: 05/21/2016   ADDENDUM REPORT: 05/21/2016 14:12 ADDENDUM: Target Lesions: 1. Right hepatic lobe lesion measures 0.8 cm (image 27 series 2) today compared to 0.9 cm on 04/03/16. 2. Pancreatic tail mass measures 3.3 cm today (image 21 series 2) compared to 3.3 cm on 04/03/16. Non-target Lesions: 1. Multiple hepatic metastases -including posterior right lobe of liver lesion measuring 2.2 cm, image 57 of series 4. Compared with 0.8 cm on 04/03/2016 2. Gastrohepatic ligament lymphadenopathy (image 18 series 2) -present 3. Retroperitoneal lymphadenopathy (image 21 series 2)-present 4.  Persistent bilateral pleural effusions compared with 04/03/2016. Electronically Signed   By: Kerby Moors M.D.   On: 05/21/2016 14:12   Addendum Date: 05/21/2016   ADDENDUM REPORT: 05/21/2016 09:11 ADDENDUM: Hepatobiliary: Metastasis within the posterior right lobe of liver measures 2.2 cm, image 57 of series 4. Previously 0.9 cm. Stable 0.08 cm central right lobe of liver metastasis, image 54 of series 4. Electronically Signed   By: Kerby Moors M.D.   On: 05/21/2016 09:11   Result Date: 05/21/2016 CLINICAL DATA:  Pancreas cancer. EXAM: CT CHEST, ABDOMEN, AND PELVIS WITH CONTRAST TECHNIQUE: Multidetector CT  imaging of the chest, abdomen and pelvis was performed following the standard protocol during bolus administration of intravenous contrast. CONTRAST:  137mL ISOVUE-300 IOPAMIDOL (ISOVUE-300) INJECTION 61% COMPARISON:  03/10/2016 RECIST 1.1 Target Lesions: 1. Right hepatic lobe lesion measures 0.8 cm (image 54 series 4 ) today compared to 0.8 cm on 03/10/2016. 2. Pancreatic tail mass measures 3.2 cm today (image 59 of series 4) compared to 3.8 cm previously. Non-target Lesions: 1. Multiple hepatic metastases -present 2. Gastrohepatic ligament lymphadenopathy (image 55 of series 4) -present 3. Retroperitoneal lymphadenopathy (image 60 series 4)-present FINDINGS: CT CHEST FINDINGS Cardiovascular: Heart size is normal. No pericardial effusion identified. Mediastinum/Nodes: The trachea appears patent and is midline. Normal appearance of the esophagus. Right paratracheal lymph node measures 0.8 cm, image 20 of series 4, previously 0.7 cm. Lungs/Pleura: Small bilateral pleural effusions are identified. Pulmonary nodule within the right upper lobe measures 6 mm, image 49 of series 7. Previously this measured 3 mm. Left upper lobe lung nodule is stable measuring 3 mm, image 48 of series 7. Ground scratch set patchy areas of ground-glass attenuation are identified within the right upper low, image 36 of series 7. Similar appearance of scar like density in the  right middle low, image 92 of series 7. Musculoskeletal: No chest wall mass or suspicious bone lesions identified. CT ABDOMEN PELVIS FINDINGS Hepatobiliary: Metastasis within the posterior right lobe of liver measures 2.2 cm, image 57 of series 4. Previously 0.9 cm stable 8 mm central right lobe of liver metastasis, image 54 of series 4. Pancreas: Mass within the tail of pancreas measures 3.3 cm, image 59 36 of series 2. This is compared with 3.8 cm previously. Spleen: Perfusion abnormality involving the spleen appears similar to previous exam. This likely reflects tumor  involvement of the splenic artery. Adrenals/Urinary Tract: The adrenal glands are normal. Unremarkable appearance of both kidneys. The urinary bladder appears normal. Stomach/Bowel: The stomach and the small bowel loops have a normal course and caliber. There is no evidence for a bowel obstruction. Vascular/Lymphatic: Aortic atherosclerosis noted. Measures 1.6 x 2.9 cm, image 39 of series 2. On the previous exam this measured 1.2 x 1.2 cm. This is compared with the same previously. The index gastrohepatic ligament lymph node Measures 9 mm, image 55 of series 4. Previously this measured 8 mm. Reproductive: Uterus and bilateral adnexa are unremarkable. Other: There is a moderate amount sub pelvic ascites. This is new from previous exam. Musculoskeletal: No aggressive lytic or sclerotic bone lesions identified. IMPRESSION: 1. Pulmonary nodule within the right upper lobe has increased in size from previous exam and is worrisome for progression of metastatic disease. 2. Posterior right lobe of liver metastasis demonstrates significant interval increase in size from previous exam. 3. Apparent decrease in size of pancreatic tail mass. 4. Increase in size of periaortic adenopathy. 5. New bilateral pleural effusions. Electronically Signed: By: Kerby Moors M.D. On: 05/18/2016 16:57   US Abdomen Limited  Result Date: 05/27/2016 CLINICAL DATA:  Pancreatic malignancy. Assess ascites volume for possible paracentesis. EXAM: LIMITED ABDOMEN ULTRASOUND FOR ASCITES TECHNIQUE: Limited ultrasound survey for ascites was performed in all four abdominal quadrants. COMPARISON:  Abdominal and pelvic CT scan of May 18, 2016 FINDINGS: Ends Santiago Glad A shin of the 4 quadrants of the abdomen reveals a small amount of free fluid. The volume is felt insufficient for successful paracentesis. IMPRESSION: Small amount of ascites insufficient for successful paracentesis. Electronically Signed   By: David  Martinique M.D.   On: 05/27/2016 07:44     US Paracentesis  Result Date: 06/02/2016 INDICATION: Pancreatic cancer, ascites. Request made for diagnostic and therapeutic paracentesis. EXAM: ULTRASOUND GUIDED DIAGNOSTIC THERAPEUTIC PARACENTESIS MEDICATIONS: None. COMPLICATIONS: None immediate. PROCEDURE: Informed written consent was obtained from the patient after a discussion of the risks, benefits and alternatives to treatment. A timeout was performed prior to the initiation of the procedure. Initial ultrasound scanning demonstrates a small to moderate amount of ascites within the right upper to mid abdominal quadrant. The right upper to mid abdomen was prepped and draped in the usual sterile fashion. 1% lidocaine was used for local anesthesia. Following this, a Yueh catheter was introduced. An ultrasound image was saved for documentation purposes. The paracentesis was performed. The catheter was removed and a dressing was applied. The patient tolerated the procedure well without immediate post procedural complication. FINDINGS: A total of approximately 2.2 liters of slightly hazy, yellow fluid was removed. Samples were sent to the laboratory as requested by the clinical team. IMPRESSION: Successful ultrasound-guided diagnostic and therapeutic paracentesis yielding 2.2 liters of peritoneal fluid. Read by: Rowe Robert, PA-C Electronically Signed   By: Marybelle Killings M.D.   On: 06/02/2016 12:36    Time Spent in minutes  25   Louellen Molder M.D on 06/07/2016 at 12:32 PM  Between 7am to 7pm - Pager - [phone]  After 7pm go to [email or web address] - password [facility]  [facility] -  Office  [phone]

## 2016-06-08 ENCOUNTER — Inpatient Hospital Stay (HOSPITAL_COMMUNITY): Payer: Medicare Other

## 2016-06-08 DIAGNOSIS — R180 Malignant ascites: Secondary | ICD-10-CM

## 2016-06-08 DIAGNOSIS — C801 Malignant (primary) neoplasm, unspecified: Secondary | ICD-10-CM

## 2016-06-08 DIAGNOSIS — R140 Abdominal distension (gaseous): Secondary | ICD-10-CM

## 2016-06-08 DIAGNOSIS — C258 Malignant neoplasm of overlapping sites of pancreas: Secondary | ICD-10-CM

## 2016-06-08 DIAGNOSIS — G893 Neoplasm related pain (acute) (chronic): Secondary | ICD-10-CM

## 2016-06-08 DIAGNOSIS — K1231 Oral mucositis (ulcerative) due to antineoplastic therapy: Secondary | ICD-10-CM

## 2016-06-08 DIAGNOSIS — G620 Drug-induced polyneuropathy: Secondary | ICD-10-CM

## 2016-06-08 DIAGNOSIS — J90 Pleural effusion, not elsewhere classified: Secondary | ICD-10-CM

## 2016-06-08 DIAGNOSIS — C787 Secondary malignant neoplasm of liver and intrahepatic bile duct: Secondary | ICD-10-CM

## 2016-06-08 DIAGNOSIS — C786 Secondary malignant neoplasm of retroperitoneum and peritoneum: Principal | ICD-10-CM

## 2016-06-08 MED ORDER — FAMOTIDINE 20 MG PO TABS
20.0000 mg | ORAL_TABLET | Freq: Two times a day (BID) | ORAL | Status: DC
  Administered 2016-06-08: 20 mg via ORAL
  Filled 2016-06-08: qty 1

## 2016-06-08 MED ORDER — ALUM & MAG HYDROXIDE-SIMETH 200-200-20 MG/5ML PO SUSP: 15.0000 mL | ORAL | Status: DC | PRN

## 2016-06-08 MED ORDER — HEPARIN SOD (PORK) LOCK FLUSH 100 UNIT/ML IV SOLN
500.0000 [IU] | INTRAVENOUS | Status: AC | PRN
  Administered 2016-06-08: 500 [IU]

## 2016-06-08 NOTE — Discharge Summary (Signed)
Physician Discharge Summary  Golden Havard [id number] DOB: October 13, 1950 DOA: 06/06/2016  PCP: Redge Gainer, MD  Admit date: 06/06/2016 Discharge date: 06/08/2016  Admitted From: home Disposition: home  Recommendations for Outpatient Follow-up:  Follow up with Dr Benay Spice tomorrow.  referral to [facility] has been made.  Home Health: [facility]  Equipment/Devices:none  Discharge Condition: Guarded CODE STATUS: DO NOT RESUSCITATE Diet recommendation: Regular    Discharge Diagnoses:   Principal problem Malignant ascites  Active Problems:   Essential hypertension   Cancer of pancreas, tail ([place])   Constipation   Peritoneal carcinomatosis ([place])  Brief narrative/history of present illness 65 year old female with stage IV pancreatic cancer with liver metastases, recent paracentesis showing peritoneal carcinomatosis presented to the ED with abdominal pain with distention and being constipated for past 10 days unimproved with home dose of Colace and magnesium citrate. In the ED CT scan done showed moderate ascites fluid with significant stool burden. Patient was given an enema with significant bowel movement. She had low-grade temperature of 99 F, heart rate of 110s. Blood work showed WBC of 13 K and diagnostic paracentesis showed 1300 WBC. Patient started on Rocephin for suspected SBP and admitted to hospitalist service.  Principal problem Metastatic pancreatic cancer with malignant ascites Recent paracentesis shows malignant ascites cells. Resumed her home pain medications. Increased ascites and constipation was also contributing increased abdominal pain. Elevated WBC seen on diagnostic paracentesis on admission likely due to malignancy, no clinical signs or symptoms of SBP. Cultures negative. I have discontinued her antibiotics. Patient underwent therapeutic paracentesis today with 2.8 L ascites fluid removed. No medications. Fluid sent for cytology as per  oncology recommendation. Patient and her daughter wish to be set up with home hospice. Referral made to [facility]. Patient is going to see Dr. Learta Codding tomorrow for scheduled chemotherapy and will discuss with him on continuation of chemotherapy.  She is stable to be discharged home. Discussed plan with Dr. Learta Codding in detail.  Severe constipation Contributed by narcotics and ascites. Improved with an enema. Resumed home bowel regimen.  Severe protein calorie malnutrition Dietitian consulted.  Acute gastritis Continue Pepcid. I have instructed patient to avoid NSAIDs as was possible.  Procedure: Diagnostic and therapeutic paracentesis.  Family communication: Daughter Bonnita Nasuti at bedside   Disposition: Home   Discharge Instructions     Medication List    STOP taking these medications   ibuprofen 200 MG tablet Commonly known as:  ADVIL,MOTRIN     TAKE these medications   amLODipine 2.5 MG tablet Commonly known as:  NORVASC TAKE 1 TABLET (2.5 MG TOTAL) BY MOUTH DAILY.   docusate sodium 100 MG capsule Commonly known as:  COLACE Take 100 mg by mouth 2 (two) times daily.   famotidine 20 MG tablet Commonly known as:  PEPCID One at bedtime   HYDROmorphone 4 MG tablet Commonly known as:  DILAUDID Take 1-2 tablets (4-8 mg total) by mouth every 4 (four) hours as needed for severe pain.   imipramine 50 MG tablet Commonly known as:  TOFRANIL Take 3 tablets (150 mg total) by mouth at bedtime.   magnesium citrate Soln Take 0.5 Bottles by mouth once.   morphine 15 MG 12 hr tablet Commonly known as:  MS CONTIN Take 1 tablet (15 mg total) by mouth every 12 (twelve) hours.   polyethylene glycol packet Commonly known as:  MIRALAX / GLYCOLAX Take 17 g by mouth daily as needed for mild constipation or moderate constipation.   prochlorperazine 10 MG  tablet Commonly known as:  COMPAZINE TAKE 1 TABLET (10 MG TOTAL) BY MOUTH EVERY 6 (SIX) HOURS AS NEEDED FOR NAUSEA OR  VOMITING.      Follow-up Information    Betsy Coder, MD Follow up on 06/09/2016.   Specialty:  Oncology Contact information: [place] [address] [phone]          Allergies  Allergen Reactions  . Macrobid [Nitrofurantoin Macrocrystal] Rash  . Ace Inhibitors Cough    Consultations:  Dr. Learta Codding   Procedures/Studies: Ct Chest W Contrast  Addendum Date: 05/21/2016   ADDENDUM REPORT: 05/21/2016 14:12 ADDENDUM: Target Lesions: 1. Right hepatic lobe lesion measures 0.8 cm (image 27 series 2) today compared to 0.9 cm on 04/03/16. 2. Pancreatic tail mass measures 3.3 cm today (image 21 series 2) compared to 3.3 cm on 04/03/16. Non-target Lesions: 1. Multiple hepatic metastases -including posterior right lobe of liver lesion measuring 2.2 cm, image 57 of series 4. Compared with 0.8 cm on 04/03/2016 2. Gastrohepatic ligament lymphadenopathy (image 18 series 2) -present 3. Retroperitoneal lymphadenopathy (image 21 series 2)-present 4.  Persistent bilateral pleural effusions compared with 04/03/2016. Electronically Signed   By: Kerby Moors M.D.   On: 05/21/2016 14:12   Addendum Date: 05/21/2016   ADDENDUM REPORT: 05/21/2016 09:11 ADDENDUM: Hepatobiliary: Metastasis within the posterior right lobe of liver measures 2.2 cm, image 57 of series 4. Previously 0.9 cm. Stable 0.08 cm central right lobe of liver metastasis, image 54 of series 4. Electronically Signed   By: Kerby Moors M.D.   On: 05/21/2016 09:11   Result Date: 05/21/2016 CLINICAL DATA:  Pancreas cancer. EXAM: CT CHEST, ABDOMEN, AND PELVIS WITH CONTRAST TECHNIQUE: Multidetector CT imaging of the chest, abdomen and pelvis was performed following the standard protocol during bolus administration of intravenous contrast. CONTRAST:  183mL ISOVUE-300 IOPAMIDOL (ISOVUE-300) INJECTION 61% COMPARISON:  03/10/2016 RECIST 1.1 Target Lesions: 1. Right hepatic lobe lesion measures 0.8 cm (image 54 series 4 ) today  compared to 0.8 cm on 03/10/2016. 2. Pancreatic tail mass measures 3.2 cm today (image 59 of series 4) compared to 3.8 cm previously. Non-target Lesions: 1. Multiple hepatic metastases -present 2. Gastrohepatic ligament lymphadenopathy (image 55 of series 4) -present 3. Retroperitoneal lymphadenopathy (image 60 series 4)-present FINDINGS: CT CHEST FINDINGS Cardiovascular: Heart size is normal. No pericardial effusion identified. Mediastinum/Nodes: The trachea appears patent and is midline. Normal appearance of the esophagus. Right paratracheal lymph node measures 0.8 cm, image 20 of series 4, previously 0.7 cm. Lungs/Pleura: Small bilateral pleural effusions are identified. Pulmonary nodule within the right upper lobe measures 6 mm, image 49 of series 7. Previously this measured 3 mm. Left upper lobe lung nodule is stable measuring 3 mm, image 48 of series 7. Ground scratch set patchy areas of ground-glass attenuation are identified within the right upper low, image 36 of series 7. Similar appearance of scar like density in the right middle low, image 92 of series 7. Musculoskeletal: No chest wall mass or suspicious bone lesions identified. CT ABDOMEN PELVIS FINDINGS Hepatobiliary: Metastasis within the posterior right lobe of liver measures 2.2 cm, image 57 of series 4. Previously 0.9 cm stable 8 mm central right lobe of liver metastasis, image 54 of series 4. Pancreas: Mass within the tail of pancreas measures 3.3 cm, image 59 36 of series 2. This is compared with 3.8 cm previously. Spleen: Perfusion abnormality involving the spleen appears similar to previous exam. This likely reflects tumor involvement of the splenic artery. Adrenals/Urinary  Tract: The adrenal glands are normal. Unremarkable appearance of both kidneys. The urinary bladder appears normal. Stomach/Bowel: The stomach and the small bowel loops have a normal course and caliber. There is no evidence for a bowel obstruction. Vascular/Lymphatic: Aortic  atherosclerosis noted. Measures 1.6 x 2.9 cm, image 39 of series 2. On the previous exam this measured 1.2 x 1.2 cm. This is compared with the same previously. The index gastrohepatic ligament lymph node Measures 9 mm, image 55 of series 4. Previously this measured 8 mm. Reproductive: Uterus and bilateral adnexa are unremarkable. Other: There is a moderate amount sub pelvic ascites. This is new from previous exam. Musculoskeletal: No aggressive lytic or sclerotic bone lesions identified. IMPRESSION: 1. Pulmonary nodule within the right upper lobe has increased in size from previous exam and is worrisome for progression of metastatic disease. 2. Posterior right lobe of liver metastasis demonstrates significant interval increase in size from previous exam. 3. Apparent decrease in size of pancreatic tail mass. 4. Increase in size of periaortic adenopathy. 5. New bilateral pleural effusions. Electronically Signed: By: Kerby Moors M.D. On: 05/18/2016 16:57   Ct Abdomen Pelvis W Contrast  Result Date: 06/06/2016 CLINICAL DATA:  Pt states she has been unable to have b.m. X 10 days, in spite of laxatives, including mg citrate. She is in no distress. She also tells me she underwent paracentesis about a week ago and that she has pancreatic and liver cancer. EXAM: CT ABDOMEN AND PELVIS WITH CONTRAST TECHNIQUE: Multidetector CT imaging of the abdomen and pelvis was performed using the standard protocol following bolus administration of intravenous contrast. CONTRAST:  100 mL of Isovue 370 intravenous contrast. COMPARISON:  CT, 05/18/2016 FINDINGS: Lower chest: Moderate left and small right pleural effusions. There is dependent opacity most evident in the left lower lobe, consistent with atelectasis. Pneumonia is possible. No pulmonary edema. Heart normal in size. Hepatobiliary: Low-density liver lesions, largest arising from the posterior segment right lobe, measuring 2.7 cm, increased from 2.2 cm on the prior CT. No  convincing new liver lesions. Gallbladder is distended but otherwise unremarkable. No bile duct dilation. Pancreas: Atrophic. Ill-defined heterogeneous mass arises from the talus contiguous with the medial inferior spleen. This is stable. Spleen: Heterogeneous attenuation with areas of low attenuation along the anterior and inferior aspect of the spleen. This is likely from areas of infarction. It is stable from the prior CT. Spleen normal in overall size. Adrenals/Urinary Tract: No adrenal masses. Kidneys, ureters and bladder are unremarkable. Stomach/Bowel: There is moderate-to-marked increased stool in the colon. This distends colon, most notably the ascending colon and cecum. Lower ascending colon measures 7.5 cm in greatest diameter. No colonic wall thickening. No inflammatory changes. Stomach and small bowel are unremarkable. Vascular/Lymphatic: No discrete enlarged lymph nodes. Minor atherosclerotic calcifications along the infrarenal abdominal aorta. Reproductive: Small enhancing focus in the upper uterus, likely a fibroid. Uterus otherwise unremarkable. No adnexal masses. Other: Moderate amount of ascites. There is enhancement of the peritoneal margins in the cul-de-sac without a discrete nodule or mass. Musculoskeletal: No osteoblastic or osteolytic lesions. IMPRESSION: 1. Moderate-to-marked increased stool in the colon. No evidence of bowel obstruction or bowel inflammation. 2. Moderate amount of ascites. Peritoneal carcinomatosis is suspected although no discrete peritoneal mass is visualized. Ascites has increased when compared to the prior CT. 3. Liver masses consistent with liver metastatic disease. The largest in the posterior segment of the right lobe measures 2.7 cm, increased from 2.2 cm on the prior CT. 4. The pancreatic tail  ill-defined mass is stable. Abnormal attenuation in the spleen, which may be from areas of infarction, is also stable. 5. Moderate left and small right pleural effusions.  Associated lower lobe parenchymal opacity, most evident in the left lower lobe, most likely atelectasis. These findings have increased since the prior study. Electronically Signed   By: Lajean Manes M.D.   On: 06/06/2016 15:22   Ct Abdomen Pelvis W Contrast  Addendum Date: 05/21/2016   ADDENDUM REPORT: 05/21/2016 14:12 ADDENDUM: Target Lesions: 1. Right hepatic lobe lesion measures 0.8 cm (image 27 series 2) today compared to 0.9 cm on 04/03/16. 2. Pancreatic tail mass measures 3.3 cm today (image 21 series 2) compared to 3.3 cm on 04/03/16. Non-target Lesions: 1. Multiple hepatic metastases -including posterior right lobe of liver lesion measuring 2.2 cm, image 57 of series 4. Compared with 0.8 cm on 04/03/2016 2. Gastrohepatic ligament lymphadenopathy (image 18 series 2) -present 3. Retroperitoneal lymphadenopathy (image 21 series 2)-present 4.  Persistent bilateral pleural effusions compared with 04/03/2016. Electronically Signed   By: Kerby Moors M.D.   On: 05/21/2016 14:12   Addendum Date: 05/21/2016   ADDENDUM REPORT: 05/21/2016 09:11 ADDENDUM: Hepatobiliary: Metastasis within the posterior right lobe of liver measures 2.2 cm, image 57 of series 4. Previously 0.9 cm. Stable 0.08 cm central right lobe of liver metastasis, image 54 of series 4. Electronically Signed   By: Kerby Moors M.D.   On: 05/21/2016 09:11   Result Date: 05/21/2016 CLINICAL DATA:  Pancreas cancer. EXAM: CT CHEST, ABDOMEN, AND PELVIS WITH CONTRAST TECHNIQUE: Multidetector CT imaging of the chest, abdomen and pelvis was performed following the standard protocol during bolus administration of intravenous contrast. CONTRAST:  147mL ISOVUE-300 IOPAMIDOL (ISOVUE-300) INJECTION 61% COMPARISON:  03/10/2016 RECIST 1.1 Target Lesions: 1. Right hepatic lobe lesion measures 0.8 cm (image 54 series 4 ) today compared to 0.8 cm on 03/10/2016. 2. Pancreatic tail mass measures 3.2 cm today (image 59 of series 4) compared to 3.8 cm previously.  Non-target Lesions: 1. Multiple hepatic metastases -present 2. Gastrohepatic ligament lymphadenopathy (image 55 of series 4) -present 3. Retroperitoneal lymphadenopathy (image 60 series 4)-present FINDINGS: CT CHEST FINDINGS Cardiovascular: Heart size is normal. No pericardial effusion identified. Mediastinum/Nodes: The trachea appears patent and is midline. Normal appearance of the esophagus. Right paratracheal lymph node measures 0.8 cm, image 20 of series 4, previously 0.7 cm. Lungs/Pleura: Small bilateral pleural effusions are identified. Pulmonary nodule within the right upper lobe measures 6 mm, image 49 of series 7. Previously this measured 3 mm. Left upper lobe lung nodule is stable measuring 3 mm, image 48 of series 7. Ground scratch set patchy areas of ground-glass attenuation are identified within the right upper low, image 36 of series 7. Similar appearance of scar like density in the right middle low, image 92 of series 7. Musculoskeletal: No chest wall mass or suspicious bone lesions identified. CT ABDOMEN PELVIS FINDINGS Hepatobiliary: Metastasis within the posterior right lobe of liver measures 2.2 cm, image 57 of series 4. Previously 0.9 cm stable 8 mm central right lobe of liver metastasis, image 54 of series 4. Pancreas: Mass within the tail of pancreas measures 3.3 cm, image 59 36 of series 2. This is compared with 3.8 cm previously. Spleen: Perfusion abnormality involving the spleen appears similar to previous exam. This likely reflects tumor involvement of the splenic artery. Adrenals/Urinary Tract: The adrenal glands are normal. Unremarkable appearance of both kidneys. The urinary bladder appears normal. Stomach/Bowel: The stomach and the small bowel  loops have a normal course and caliber. There is no evidence for a bowel obstruction. Vascular/Lymphatic: Aortic atherosclerosis noted. Measures 1.6 x 2.9 cm, image 39 of series 2. On the previous exam this measured 1.2 x 1.2 cm. This is compared  with the same previously. The index gastrohepatic ligament lymph node Measures 9 mm, image 55 of series 4. Previously this measured 8 mm. Reproductive: Uterus and bilateral adnexa are unremarkable. Other: There is a moderate amount sub pelvic ascites. This is new from previous exam. Musculoskeletal: No aggressive lytic or sclerotic bone lesions identified. IMPRESSION: 1. Pulmonary nodule within the right upper lobe has increased in size from previous exam and is worrisome for progression of metastatic disease. 2. Posterior right lobe of liver metastasis demonstrates significant interval increase in size from previous exam. 3. Apparent decrease in size of pancreatic tail mass. 4. Increase in size of periaortic adenopathy. 5. New bilateral pleural effusions. Electronically Signed: By: Kerby Moors M.D. On: 05/18/2016 16:57   US Abdomen Limited  Result Date: 05/27/2016 CLINICAL DATA:  Pancreatic malignancy. Assess ascites volume for possible paracentesis. EXAM: LIMITED ABDOMEN ULTRASOUND FOR ASCITES TECHNIQUE: Limited ultrasound survey for ascites was performed in all four abdominal quadrants. COMPARISON:  Abdominal and pelvic CT scan of May 18, 2016 FINDINGS: Ends [place] Glad A shin of the 4 quadrants of the abdomen reveals a small amount of free fluid. The volume is felt insufficient for successful paracentesis. IMPRESSION: Small amount of ascites insufficient for successful paracentesis. Electronically Signed   By: David  Martinique M.D.   On: 05/27/2016 07:44   US Paracentesis  Result Date: 06/02/2016 INDICATION: Pancreatic cancer, ascites. Request made for diagnostic and therapeutic paracentesis. EXAM: ULTRASOUND GUIDED DIAGNOSTIC THERAPEUTIC PARACENTESIS MEDICATIONS: None. COMPLICATIONS: None immediate. PROCEDURE: Informed written consent was obtained from the patient after a discussion of the risks, benefits and alternatives to treatment. A timeout was performed prior to the initiation of the procedure.  Initial ultrasound scanning demonstrates a small to moderate amount of ascites within the right upper to mid abdominal quadrant. The right upper to mid abdomen was prepped and draped in the usual sterile fashion. 1% lidocaine was used for local anesthesia. Following this, a Yueh catheter was introduced. An ultrasound image was saved for documentation purposes. The paracentesis was performed. The catheter was removed and a dressing was applied. The patient tolerated the procedure well without immediate post procedural complication. FINDINGS: A total of approximately 2.2 liters of slightly hazy, yellow fluid was removed. Samples were sent to the laboratory as requested by the clinical team. IMPRESSION: Successful ultrasound-guided diagnostic and therapeutic paracentesis yielding 2.2 liters of peritoneal fluid. Read by: Rowe Robert, PA-C Electronically Signed   By: Marybelle Killings M.D.   On: 06/02/2016 12:36    (Echo, Carotid, EGD, Colonoscopy, ERCP)    Subjective: Elderly thin built female not in distress  HEENT: No pallor, moist mucosa, supple neck Chest: Right-sided Port-A-Cath, clear bilaterally  CVS: Normal S1 and S2, no murmurs rub or gallop GI: Abdominal distended with ascites, mild epigastric tenderness Musculoskeletal: Warm, no edema  Discharge Exam: Vitals:   06/08/16 1248 06/08/16 1253  BP: 140/72 (!) 130/58  Pulse:    Resp:    Temp:     Vitals:   06/08/16 1241 06/08/16 1244 06/08/16 1248 06/08/16 1253  BP: (!) 149/80 (!) 145/76 140/72 (!) 130/58  Pulse:      Resp:      Temp:      TempSrc:      SpO2:  Weight:      Height:        General: Pt is alert, awake, not in acute distress Cardiovascular: RRR, S1/S2 +, no rubs, no gallops Respiratory: CTA bilaterally, no wheezing, no rhonchi Abdominal: Soft, NT, ND, bowel sounds + Extremities: no edema, no cyanosis    The results of significant diagnostics from this hospitalization (including imaging, microbiology, ancillary  and laboratory) are listed below for reference.     Microbiology: Recent Results (from the past 240 hour(s))  Body fluid culture     Status: None   Collection Time: 06/02/16 12:00 PM  Result Value Ref Range Status   Specimen Description PERITONEAL  Final   Special Requests NONE  Final   Gram Stain   Final    MODERATE WBC PRESENT, PREDOMINANTLY PMN NO ORGANISMS SEEN    Culture   Final    NO GROWTH 3 DAYS Performed at [facility]    Report Status 06/06/2016 FINAL  Final  Body fluid culture     Status: None (Preliminary result)   Collection Time: 06/06/16  1:40 PM  Result Value Ref Range Status   Specimen Description PERITONEAL CAVITY  Final   Special Requests NONE  Final   Gram Stain   Final    WBC PRESENT,BOTH PMN AND MONONUCLEAR NO ORGANISMS SEEN CYTOSPIN SMEAR    Culture   Final    NO GROWTH 2 DAYS Performed at [facility]    Report Status PENDING  Incomplete     Labs: BNP (last 3 results) No results for input(s): BNP in the last 8760 hours. Basic Metabolic Panel:  Recent Labs Lab 06/06/16 1143 06/07/16 0533  NA 134* 132*  K 4.7 4.8  CL 101 96*  CO2 26 27  GLUCOSE 120* 124*  BUN 17 19  CREATININE 0.50 0.65  CALCIUM 7.9* 8.8*   Liver Function Tests:  Recent Labs Lab 06/06/16 1143  AST 17  ALT 14  ALKPHOS 125  BILITOT 0.7  PROT 5.5*  ALBUMIN 2.4*    Recent Labs Lab 06/06/16 1143  LIPASE 14   No results for input(s): AMMONIA in the last 168 hours. CBC:  Recent Labs Lab 06/06/16 1143 06/07/16 0533  WBC 14.8* 13.8*  NEUTROABS 12.2*  --   HGB 9.6* 10.4*  HCT 30.0* 32.9*  MCV 88.8 90.6  PLT 330 371   Cardiac Enzymes: No results for input(s): CKTOTAL, CKMB, CKMBINDEX, TROPONINI in the last 168 hours. BNP: Invalid input(s): POCBNP CBG: No results for input(s): GLUCAP in the last 168 hours. D-Dimer No results for input(s): DDIMER in the last 72 hours. Hgb A1c No results for input(s): HGBA1C in the last 72  hours. Lipid Profile No results for input(s): CHOL, HDL, LDLCALC, TRIG, CHOLHDL, LDLDIRECT in the last 72 hours. Thyroid function studies No results for input(s): TSH, T4TOTAL, T3FREE, THYROIDAB in the last 72 hours.  Invalid input(s): FREET3 Anemia work up No results for input(s): VITAMINB12, FOLATE, FERRITIN, TIBC, IRON, RETICCTPCT in the last 72 hours. Urinalysis    Component Value Date/Time   BILIRUBINUR neg 04/03/2015 1057   PROTEINUR neg 04/03/2015 1057   UROBILINOGEN negative 04/03/2015 1057   NITRITE neg 04/03/2015 1057   LEUKOCYTESUR Negative 04/03/2015 1057   Sepsis Labs Invalid input(s): PROCALCITONIN,  WBC,  LACTICIDVEN Microbiology Recent Results (from the past 240 hour(s))  Body fluid culture     Status: None   Collection Time: 06/02/16 12:00 PM  Result Value Ref Range Status   Specimen Description PERITONEAL  Final   Special Requests NONE  Final   Gram Stain   Final    MODERATE WBC PRESENT, PREDOMINANTLY PMN NO ORGANISMS SEEN    Culture   Final    NO GROWTH 3 DAYS Performed at [facility]    Report Status 06/06/2016 FINAL  Final  Body fluid culture     Status: None (Preliminary result)   Collection Time: 06/06/16  1:40 PM  Result Value Ref Range Status   Specimen Description PERITONEAL CAVITY  Final   Special Requests NONE  Final   Gram Stain   Final    WBC PRESENT,BOTH PMN AND MONONUCLEAR NO ORGANISMS SEEN CYTOSPIN SMEAR    Culture   Final    NO GROWTH 2 DAYS Performed at [facility]    Report Status PENDING  Incomplete     Time coordinating discharge: <30 minutes  SIGNED:   Louellen Molder, MD  [facility] 06/08/2016, 1:36 PM Pager   If 7PM-7AM, please contact night-coverage [email or web address] Password [email or web address]

## 2016-06-08 NOTE — Procedures (Signed)
Ultrasound-guided diagnostic and therapeutic paracentesis performed yielding 3 liters of slightly hazy, yellow fluid. No immediate complications. A portion of the fluid was sent to the lab for cytology.

## 2016-06-08 NOTE — Progress Notes (Addendum)
IP PROGRESS NOTE  Subjective:   She presents emergency room on 06/06/2016 with constipation. She had abdominal pain and distention. A CT of the abdomen revealed bilateral pleural effusions, the largest liver lesion in the right lobe has increased to 2.7 cm from 2.2 cm on a CT 05/18/2016. Stable pancreas mass. Increased colonic stool with a distended colon. Moderate ascites.  She was admitted and placed on antibiotics secondary to a suspicion for peritonitis.  She is uncomfortable from the distended abdomen. Wendy Palmer did not start OxyContin. An ascitic fluid culture from 06/02/2016 is negative to date. The fluid culture from 06/06/2016 is also negative.    Objective: Vital signs in last 24 hours: Blood pressure (!) 130/58, pulse (!) 105, temperature 98.2 F (36.8 C), temperature source Oral, resp. rate 18, height 5\' 2"  (1.575 m), weight 110 lb 10.7 oz (50.2 kg), SpO2 100 %.  Intake/Output from previous day: 10/08 0701 - 10/09 0700 In: 2496.3 [P.O.:600; I.V.:1796.3; IV Piggyback:100] Out: 300 [Urine:300]  Physical Exam:  HEENT: Mild whitecoat over the tongue Lungs: Decreased breath sounds at the lower chest bilaterally, no respiratory distress Cardiac: Regular rate and rhythm Abdomen: Distended with ascites Extremities: No leg edema   Portacath/PICC-without erythema  Lab Results:  Recent Labs  06/06/16 1143 06/07/16 0533  WBC 14.8* 13.8*  HGB 9.6* 10.4*  HCT 30.0* 32.9*  PLT 330 371    BMET  Recent Labs  06/06/16 1143 06/07/16 0533  NA 134* 132*  K 4.7 4.8  CL 101 96*  CO2 26 27  GLUCOSE 120* 124*  BUN 17 19  CREATININE 0.50 0.65  CALCIUM 7.9* 8.8*    Studies/Results: Ct Abdomen Pelvis W Contrast  Result Date: 06/06/2016 CLINICAL DATA:  Pt states she has been unable to have b.m. X 10 days, in spite of laxatives, including mg citrate. She is in no distress. She also tells me she underwent paracentesis about a week ago and that she has pancreatic and  liver cancer. EXAM: CT ABDOMEN AND PELVIS WITH CONTRAST TECHNIQUE: Multidetector CT imaging of the abdomen and pelvis was performed using the standard protocol following bolus administration of intravenous contrast. CONTRAST:  100 mL of Isovue 370 intravenous contrast. COMPARISON:  CT, 05/18/2016 FINDINGS: Lower chest: Moderate left and small right pleural effusions. There is dependent opacity most evident in the left lower lobe, consistent with atelectasis. Pneumonia is possible. No pulmonary edema. Heart normal in size. Hepatobiliary: Low-density liver lesions, largest arising from the posterior segment right lobe, measuring 2.7 cm, increased from 2.2 cm on the prior CT. No convincing new liver lesions. Gallbladder is distended but otherwise unremarkable. No bile duct dilation. Pancreas: Atrophic. Ill-defined heterogeneous mass arises from the talus contiguous with the medial inferior spleen. This is stable. Spleen: Heterogeneous attenuation with areas of low attenuation along the anterior and inferior aspect of the spleen. This is likely from areas of infarction. It is stable from the prior CT. Spleen normal in overall size. Adrenals/Urinary Tract: No adrenal masses. Kidneys, ureters and bladder are unremarkable. Stomach/Bowel: There is moderate-to-marked increased stool in the colon. This distends colon, most notably the ascending colon and cecum. Lower ascending colon measures 7.5 cm in greatest diameter. No colonic wall thickening. No inflammatory changes. Stomach and small bowel are unremarkable. Vascular/Lymphatic: No discrete enlarged lymph nodes. Minor atherosclerotic calcifications along the infrarenal abdominal aorta. Reproductive: Small enhancing focus in the upper uterus, likely a fibroid. Uterus otherwise unremarkable. No adnexal masses. Other: Moderate amount of ascites. There is enhancement of the  peritoneal margins in the cul-de-sac without a discrete nodule or mass. Musculoskeletal: No  osteoblastic or osteolytic lesions. IMPRESSION: 1. Moderate-to-marked increased stool in the colon. No evidence of bowel obstruction or bowel inflammation. 2. Moderate amount of ascites. Peritoneal carcinomatosis is suspected although no discrete peritoneal mass is visualized. Ascites has increased when compared to the prior CT. 3. Liver masses consistent with liver metastatic disease. The largest in the posterior segment of the right lobe measures 2.7 cm, increased from 2.2 cm on the prior CT. 4. The pancreatic tail ill-defined mass is stable. Abnormal attenuation in the spleen, which may be from areas of infarction, is also stable. 5. Moderate left and small right pleural effusions. Associated lower lobe parenchymal opacity, most evident in the left lower lobe, most likely atelectasis. These findings have increased since the prior study. Electronically Signed   By: Lajean Manes M.D.   On: 06/06/2016 15:22    Medications: I have reviewed the patient's current medications.  Assessment/Plan:  1. Pancreas cancer  Foundation 1 testing on peripheral blood 02/12/2016-no alteration detected ? MRI abdomen 08/27/2015 revealed a pancreas mass, adenopathy, and liver metastases ? CTs 09/10/2015 with pancreas body/tail mass, liver metastases, lymphadenopathy, innumerable tiny pulmonary nodules ? Ultrasound-guided biopsy of a right liver lesion 09/10/2015 confirmed adenocarcinoma ? Enrollment on the SWOG S1313 study, randomized to the standard FOLFIRINOX arm ? Cycle 1 FOLFIRINOX 09/25/2015 ? Cycle 2 FOLFIRINOX 10/09/2015 ? Cycle 3 FOLFIRINOX 10/23/2015 ? Cycle 4 FOLFIRINOX 11/05/2015  CT 11/19/2015-decrease in the size of liver lesions and the pancreas mass, ? Increased upper abdominal/retroperitoneal adenopath  Cycle 5 FOLFIRINOX 11/20/2015  Cycle 6 FOLFIRINOX 12/04/2015  Cycle 7 FOLFIRINOX 12/18/2015  Cycle 8 FOLFIRINOX 01/01/2016  Restaging CTs 01/14/2016-decrease in size of liver  lesions and pancreas mass, stable to decreased upper abdominal lymphadenopathy, no evidence of disease progression  Cycle 9 FOLFIRINOX 01/15/2016  Cycle 10 FOLFIRINOX 02/12/2016 ( chemotherapy dose reduced per protocol based on toxicity)  Cycle 11 FOLFIRINOX 02/26/2016  Restaging CTs 03/10/2016 showed further decrease in the size of a pancreatic tail mass. Decrease in hepatic metastases. New 3 mm right upper lobe pulmonary nodule possibly infectious/inflammatory. New 6 mm hypodensity in the posterior right liver. No substantial change in abnormal soft tissue encasing the celiac axis and extending along the left periaortic retroperitoneal space. Stable 7 mm gastrohepatic ligament lymph node.  Treatment changed to FOLFIRI on a 3 week schedule beginning 03/11/2016  CT scan abdomen/pelvis and in the emergency department 04/03/2016 with new small bilateral effusions, new small amount of ascites, enlarging 11 mm hypodensity right lobe of the liver, geographic splenic hypodensity.  FOLFIRI 04/09/2016  FOLFIRI 04/30/2016  CT 05/20/2016-stable pancreas mass, enlarging right liver lesion, persistent bilateral pleural effusions, enlarging right upper lobe nodule  Clinical progression at office visit 06/02/2016, treatment changed to gemcitabine/Abraxane  2. Pain secondary to #1, improved  3. History of constipation-likely secondary to narcotics  4. Mild oxaliplatin neuropathy-mild interference with activity  5.History of mucositis secondary to chemotherapy   6. Depression-grade 1, not interfering with ADLs  7. Left anterolateral chest discomfort-etiology unclear.  She is admitted with constipation and progressive abdominal distention. I suspect her symptoms are related to carcinomatosis and malignant ascites.  I discussed the current situation and treatment options with Wendy Palmer and her daughter. She desires Comfort Care. She agrees to a [facility] referral. She  will seek care at the Hospice home if her condition declines. She would like to be placed on a no CODE BLUE status.  The ascites is very likely related to pancreas cancer. I have a low clinical suspicion for peritonitis.  She has an ECOG performance status of 1-2. There is unequivocal evidence of disease progression based on enlargement of a liver lesion on CT and development of malignant ascites.  Recommendations:  1. Therapeutic paracentesis today 2. [facility] referral 3. Follow-up at the Cancer center 06/09/2016   LOS: 2 days   Betsy Coder, MD   06/08/2016, 1:33 PM

## 2016-06-08 NOTE — Plan of Care (Signed)
Problem: Bowel/Gastric: Goal: Will not experience complications related to bowel motility Outcome: Not Progressing Pt reports feeling constipated, abdomen still distended.

## 2016-06-08 NOTE — Progress Notes (Signed)
Initial Nutrition Assessment  DOCUMENTATION CODES:   Severe malnutrition in context of chronic illness  INTERVENTION:  Recommended patient try Boost Plus chocolate mixed with ice cream to make a milk shake as patients tend to enjoy that better than Ensure. Will not order here as she is leaving today.  Reinforced importance of high-protein and high-calorie meals and snacks to continue gaining weight. Reviewed food choices that will work at home per patient request.    NUTRITION DIAGNOSIS:   Malnutrition (Severe) related to chronic illness as evidenced by 21 percent weight loss over 5 months, severe depletion of muscle mass, severe depletion of body fat.  GOAL:   Patient will meet greater than or equal to 90% of their needs  MONITOR:   PO intake, Supplement acceptance, Labs, Weight trends, I & O's  REASON FOR ASSESSMENT:   Consult Assessment of nutrition requirement/status  ASSESSMENT:   65 y.o. female with medical history significant of stage 4 pancreatic cancer with liver mets.  Patient had ascites that was tapped on the 3rd to see if she had developed peritoneal carcinomatosis.  She presents to the ED with c/o abdominal pain, distention, no BM in past 10 days.    -S/p US paracentesis 10/9 which removed 3 L of slightly hazy, yellow fluid. Sent to lab for cytology.  -Patient being followed by Raford Pitcher, outpatient RD at cancer center. She recommended patient increase Ensure Plus to TID and continue focusing on high-calorie, high-protein foods at meals to minimize weight loss.  Patient reports her appetite is not back to normal, but is improved from right after chemotherapy 2 months ago. No taste changes, but just lack of hunger. She normally has 2.5-3 meals per day with snacks. She was drinking Ensure Plus for a long time but reports she is tired of it now. Patient did finally have BM so she feels better now.  She reports UBW was 140 lbs prior to diagnosis on 08/2015. Patient  initially lost 30 lbs (21% body weight) over 5 months (08/2015-12/2015), which is significant for time frame. She has since struggled with regaining weight.   Typical Intake:  Breakfast - oatmeal with milk, walnuts, and cinnamon Lunch - peanut butter with honey and banana sandwich Dinner - soup, chicken pie, or frozen pizza  Meal Completion: 75% of breakfast this morning. Patient's daughter is bringing her a smoothie for lunch.  Medications reviewed and include: famotidine, Miralax, senna.  Labs reviewed: Sodium 132, Chloride 96, Glucose 124.  Nutrition-Focused physical exam completed. Findings are severe fat depletion, severe muscle depletion, and no edema.   Patient meets criteria for severe chronic malnutrition in setting of 21% weight loss over 5 months, severe fat depletion, and severe muscle depletion.  Discussed plan with RN. Patient leaving today.   Diet Order:  Diet regular Room service appropriate? Yes; Fluid consistency: Thin  Skin:  Reviewed, no issues  Last BM:  06/06/2016  Height:   Ht Readings from Last 1 Encounters:  06/06/16 5\' 2"  (1.575 m)    Weight:   Wt Readings from Last 1 Encounters:  06/06/16 110 lb 10.7 oz (50.2 kg)    Ideal Body Weight:  50 kg  BMI:  Body mass index is 20.24 kg/m.  Estimated Nutritional Needs:   Kcal:  1500-1760 (30-35 kcal/kg)  Protein:  60-75 grams (1.2-1.5 grams/kg)  Fluid:  1.5-1.7 L/day  EDUCATION NEEDS:   Education needs addressed (Adequate calories and protein at meals. Reviewed good protein options.)  Willey Blade, MS, RD, LDN Pager: [phone]  After Hours Pager: [phone]

## 2016-06-09 ENCOUNTER — Ambulatory Visit (HOSPITAL_BASED_OUTPATIENT_CLINIC_OR_DEPARTMENT_OTHER): Payer: Medicare Other | Admitting: Nurse Practitioner

## 2016-06-09 ENCOUNTER — Telehealth: Payer: Self-pay

## 2016-06-09 ENCOUNTER — Ambulatory Visit: Payer: Medicare Other

## 2016-06-09 ENCOUNTER — Other Ambulatory Visit: Payer: Medicare Other

## 2016-06-09 ENCOUNTER — Encounter: Payer: Self-pay | Admitting: *Deleted

## 2016-06-09 VITALS — BP 125/67 | HR 109 | Temp 97.5°F | Resp 18 | Ht 62.0 in

## 2016-06-09 DIAGNOSIS — C252 Malignant neoplasm of tail of pancreas: Secondary | ICD-10-CM

## 2016-06-09 DIAGNOSIS — J90 Pleural effusion, not elsewhere classified: Secondary | ICD-10-CM

## 2016-06-09 DIAGNOSIS — C258 Malignant neoplasm of overlapping sites of pancreas: Secondary | ICD-10-CM

## 2016-06-09 DIAGNOSIS — R188 Other ascites: Secondary | ICD-10-CM

## 2016-06-09 DIAGNOSIS — C787 Secondary malignant neoplasm of liver and intrahepatic bile duct: Secondary | ICD-10-CM | POA: Diagnosis not present

## 2016-06-09 DIAGNOSIS — C786 Secondary malignant neoplasm of retroperitoneum and peritoneum: Secondary | ICD-10-CM

## 2016-06-09 DIAGNOSIS — C801 Malignant (primary) neoplasm, unspecified: Secondary | ICD-10-CM

## 2016-06-09 MED ORDER — PROCHLORPERAZINE MALEATE 10 MG PO TABS: ORAL_TABLET | ORAL | 1 refills | Status: AC

## 2016-06-09 MED ORDER — MORPHINE SULFATE 15 MG PO TABS
15.0000 mg | ORAL_TABLET | Freq: Once | ORAL | Status: AC
  Administered 2016-06-09: 15 mg via ORAL

## 2016-06-09 MED ORDER — MORPHINE SULFATE 15 MG PO TABS
ORAL_TABLET | ORAL | Status: AC
  Filled 2016-06-09: qty 1

## 2016-06-09 NOTE — Telephone Encounter (Signed)
Called [facility] to confirm referral was sent. [place]oke [place] who states they did receive one. ProvidedLawanda patients daughters phone number per pt request.

## 2016-06-09 NOTE — Progress Notes (Signed)
Oncology Nurse Navigator Documentation  Oncology Nurse Navigator Flowsheets 06/09/2016  Navigator Location [facility]  Navigator Encounter Type Follow-up Appt  Telephone -  Abnormal Finding Date -  Confirmed Diagnosis Date -  Treatment Initiated Date -  Patient Visit Type MedOnc  Treatment Phase Other--Hospice  Barriers/Navigation Needs No barriers at this time;No Questions;No Needs  Education -  Interventions -Patient has decided she wants no further chemotherapy and to enroll in [facility]. Family have made arrangements for family to be with her 24/7. Collaborative nurse is contacting Hospice for the referral  Referrals -  Coordination of Care -  Education Method -  Support Groups/Services -Provided supportive listening and emotional support to daughters.  Acuity Level 1  Time Spent with Patient 15

## 2016-06-09 NOTE — Progress Notes (Signed)
[place] OFFICE PROGRESS NOTE   Diagnosis:  Pancreas cancer  INTERVAL HISTORY:   Wendy Palmer returns for follow-up. She was hospitalized 06/06/2016 with abdominal pain, distention and constipation. CT abdomen/pelvis showed moderate to marked increased stool; Moderate amount of ascites; peritoneal carcinomatosis suspected; liver masses consistent with liver metastatic disease with the largest in the posterior segment of the right lobe measuring 2.7 cm as compared to 2.2 cm on the prior CT; pancreatic tail mass stable; moderate left and small right pleural effusions. She underwent a paracentesis yesterday with 3 L of fluid removed.   She was seen in the hospital by Dr. Benay Spice. Dr. Benay Spice suspected her symptoms were related to carcinomatosis and malignant ascites. He discussed the current situation and treatment options with her and her daughter. She requested comfort care and agreed to a [facility] referral. She was placed on NO CODE BLUE status. She was discharged from the hospital yesterday.  She feels a significant amount of the abdominal fluid has reaccumulated since the paracentesis yesterday. She had some nausea this morning. The nausea was relieved with Compazine. She took Dilaudid last night for abdominal pain.  Objective:  Vital signs in last 24 hours:  Blood pressure 125/67, pulse (!) 109, temperature 97.5 F (36.4 C), temperature source Oral, resp. rate 18, height 5\' 2"  (1.575 m), SpO2 100 %.    HEENT: No thrush or ulcers. Resp: Lungs clear bilaterally. Cardio: Regular rate and rhythm. GI: Abdomen is distended, firm. Vascular: No leg edema. Neuro: Alert and oriented.  Portacath without erythema.   Lab Results:  Lab Results  Component Value Date   WBC 13.8 (H) 06/07/2016   HGB 10.4 (L) 06/07/2016   HCT 32.9 (L) 06/07/2016   MCV 90.6 06/07/2016   PLT 371 06/07/2016   NEUTROABS 12.2 (H) 06/06/2016    Imaging:  US Paracentesis  Result  Date: 06/08/2016 INDICATION: Pancreatic cancer, recurrent ascites. Request made for diagnostic and therapeutic paracentesis. EXAM: ULTRASOUND GUIDED DIAGNOSTIC AND THERAPEUTIC PARACENTESIS MEDICATIONS: None. COMPLICATIONS: None immediate. PROCEDURE: Informed written consent was obtained from the patient after a discussion of the risks, benefits and alternatives to treatment. A timeout was performed prior to the initiation of the procedure. Initial ultrasound scanning demonstrates a moderate amount of ascites within the right upper to mid abdominal quadrant. The right upper to mid abdomen was prepped and draped in the usual sterile fashion. 1% lidocaine was used for local anesthesia. Following this, a Yueh catheter was introduced. An ultrasound image was saved for documentation purposes. The paracentesis was performed. The catheter was removed and a dressing was applied. The patient tolerated the procedure well without immediate post procedural complication. FINDINGS: A total of approximately 3 liters of slightly hazy, yellow fluid was removed. Samples were sent to the laboratory as requested by the clinical team. IMPRESSION: Successful ultrasound-guided diagnostic and therapeutic paracentesis yielding 3 liters of peritoneal fluid. Read by: Rowe Robert, PA-C Electronically Signed   By: Aletta Edouard M.D.   On: 06/08/2016 13:35    Medications: I have reviewed the patient's current medications.  Assessment/Plan: 1. Pancreas cancer  Foundation 1 testing on peripheral blood 02/12/2016-no alteration detected ? MRI abdomen 08/27/2015 revealed a pancreas mass, adenopathy, and liver metastases ? CTs 09/10/2015 with pancreas body/tail mass, liver metastases, lymphadenopathy, innumerable tiny pulmonary nodules ? Ultrasound-guided biopsy of a right liver lesion 09/10/2015 confirmed adenocarcinoma ? Enrollment on the SWOG S1313 study, randomized to the standard FOLFIRINOX arm ? Cycle 1 FOLFIRINOX  09/25/2015 ? Cycle 2  FOLFIRINOX 10/09/2015 ? Cycle 3 FOLFIRINOX 10/23/2015 ? Cycle 4 FOLFIRINOX 11/05/2015  CT 11/19/2015-decrease in the size of liver lesions and the pancreas mass, ? Increased upper abdominal/retroperitoneal adenopath  Cycle 5 FOLFIRINOX 11/20/2015  Cycle 6 FOLFIRINOX 12/04/2015  Cycle 7 FOLFIRINOX 12/18/2015  Cycle 8 FOLFIRINOX 01/01/2016  Restaging CTs 01/14/2016-decrease in size of liver lesions and pancreas mass, stable to decreased upper abdominal lymphadenopathy, no evidence of disease progression  Cycle 9 FOLFIRINOX 01/15/2016  Cycle 10 FOLFIRINOX 02/12/2016 ( chemotherapy dose reduced per protocol based on toxicity)  Cycle 11 FOLFIRINOX 02/26/2016  Restaging CTs 03/10/2016 showed further decrease in the size of a pancreatic tail mass. Decrease in hepatic metastases. New 3 mm right upper lobe pulmonary nodule possibly infectious/inflammatory. New 6 mm hypodensity in the posterior right liver. No substantial change in abnormal soft tissue encasing the celiac axis and extending along the left periaortic retroperitoneal space. Stable 7 mm gastrohepatic ligament lymph node.  Treatment changed to FOLFIRI on a 3 week schedule beginning 03/11/2016  CT scan abdomen/pelvis and in the emergency department 04/03/2016 with new small bilateral effusions, new small amount of ascites, enlarging 11 mm hypodensity right lobe of the liver, geographic splenic hypodensity.  FOLFIRI 04/09/2016  FOLFIRI 04/30/2016  CT 05/20/2016-stable pancreas mass, enlarging right liver lesion, persistent bilateral pleural effusions, enlarging right upper lobe nodule  Clinical progression at office visit 06/02/2016, treatment changed to gemcitabine/Abraxane  2. Pain secondary to #1, improved  3. History of constipation-likely secondary to narcotics  4. Mild oxaliplatin neuropathy-mild interference with activity  5.History of mucositis secondary to chemotherapy  6.  Depression-grade 1, not interfering with ADLs  7. Left anterolateral chest discomfort-etiology unclear.   Disposition: Ms. Bonura performance status continues to decline. She had a therapeutic paracentesis yesterday. It appears the fluid has partially reaccumulated. We discussed another paracentesis. We also discussed placement of a peritoneal drainage catheter. She understands she likely has malignant ascites. Cytology from yesterday is pending.  We discussed comfort care with a hospice referral versus a trial of gemcitabine/Abraxane. We again reviewed potential toxicities associated with the chemotherapy. She understands the low likelihood that she will benefit from chemotherapy.  She and her daughters discussed the above. Ms. Mcmorran has decided on a comfort care approach with a hospice referral. [facility] hosp[facility] program.  We will schedule her for a therapeutic paracentesis 06/12/2016.  She will return for a follow-up visit in 2-3 weeks. She began experiencing abdominal pain while in the office. We gave her a dose of MSIR 15 mg.  Patient seen with Dr. Benay Spice. 30 minutes were spent face-to-face at today's visit with the majority of that time involved in counseling/coordination of care.  Ned Card ANP/GNP-BC   06/09/2016  11:30 AM  This was a shared visit with Ned Card. Ms. Moreschi was interviewed and examined. We discussed treatment options at length with WendyMarron and her daughters. We discussed supportive care versus a trial of salvage chemotherapy. She elected to proceed with supportive care to include a home hospice referral. She will continue palliative paracentesis procedures as needed.  Her daughters are scheduled for an appointment with the genetics counselor.  Julieanne Manson, M.D.

## 2016-06-10 LAB — BODY FLUID CULTURE: CULTURE: NO GROWTH

## 2016-06-11 ENCOUNTER — Telehealth: Payer: Self-pay | Admitting: *Deleted

## 2016-06-11 NOTE — Telephone Encounter (Signed)
Called daughter to confirm they still want Foundation One testing on her paracentesis cells. Was informed that she died yesterday and she forgot to call. They are currently with the pastor. Expressed our condolences and allowed her to return to family.

## 2016-06-23 ENCOUNTER — Ambulatory Visit: Payer: Medicare Other

## 2016-06-23 ENCOUNTER — Ambulatory Visit: Payer: Medicare Other | Admitting: Nurse Practitioner

## 2016-06-23 ENCOUNTER — Other Ambulatory Visit: Payer: Medicare Other

## 2016-07-01 DEATH — deceased

## 2016-07-07 ENCOUNTER — Other Ambulatory Visit: Payer: Medicare Other

## 2016-07-07 ENCOUNTER — Encounter: Payer: Medicare Other | Admitting: Genetic Counselor

## 2016-07-15 ENCOUNTER — Ambulatory Visit: Payer: Medicare Other | Admitting: Family Medicine

## 2016-08-08 ENCOUNTER — Other Ambulatory Visit: Payer: Self-pay | Admitting: Nurse Practitioner

## 2016-10-21 IMAGING — DX DG CHEST 2V
2 series · 2 of 2 positions shown · non-contrast
Comparison: 03/10/2016

CLINICAL DATA: Left lower chest pain for several months, no known
injury

EXAM:
CHEST  2 VIEW

[chest pa]
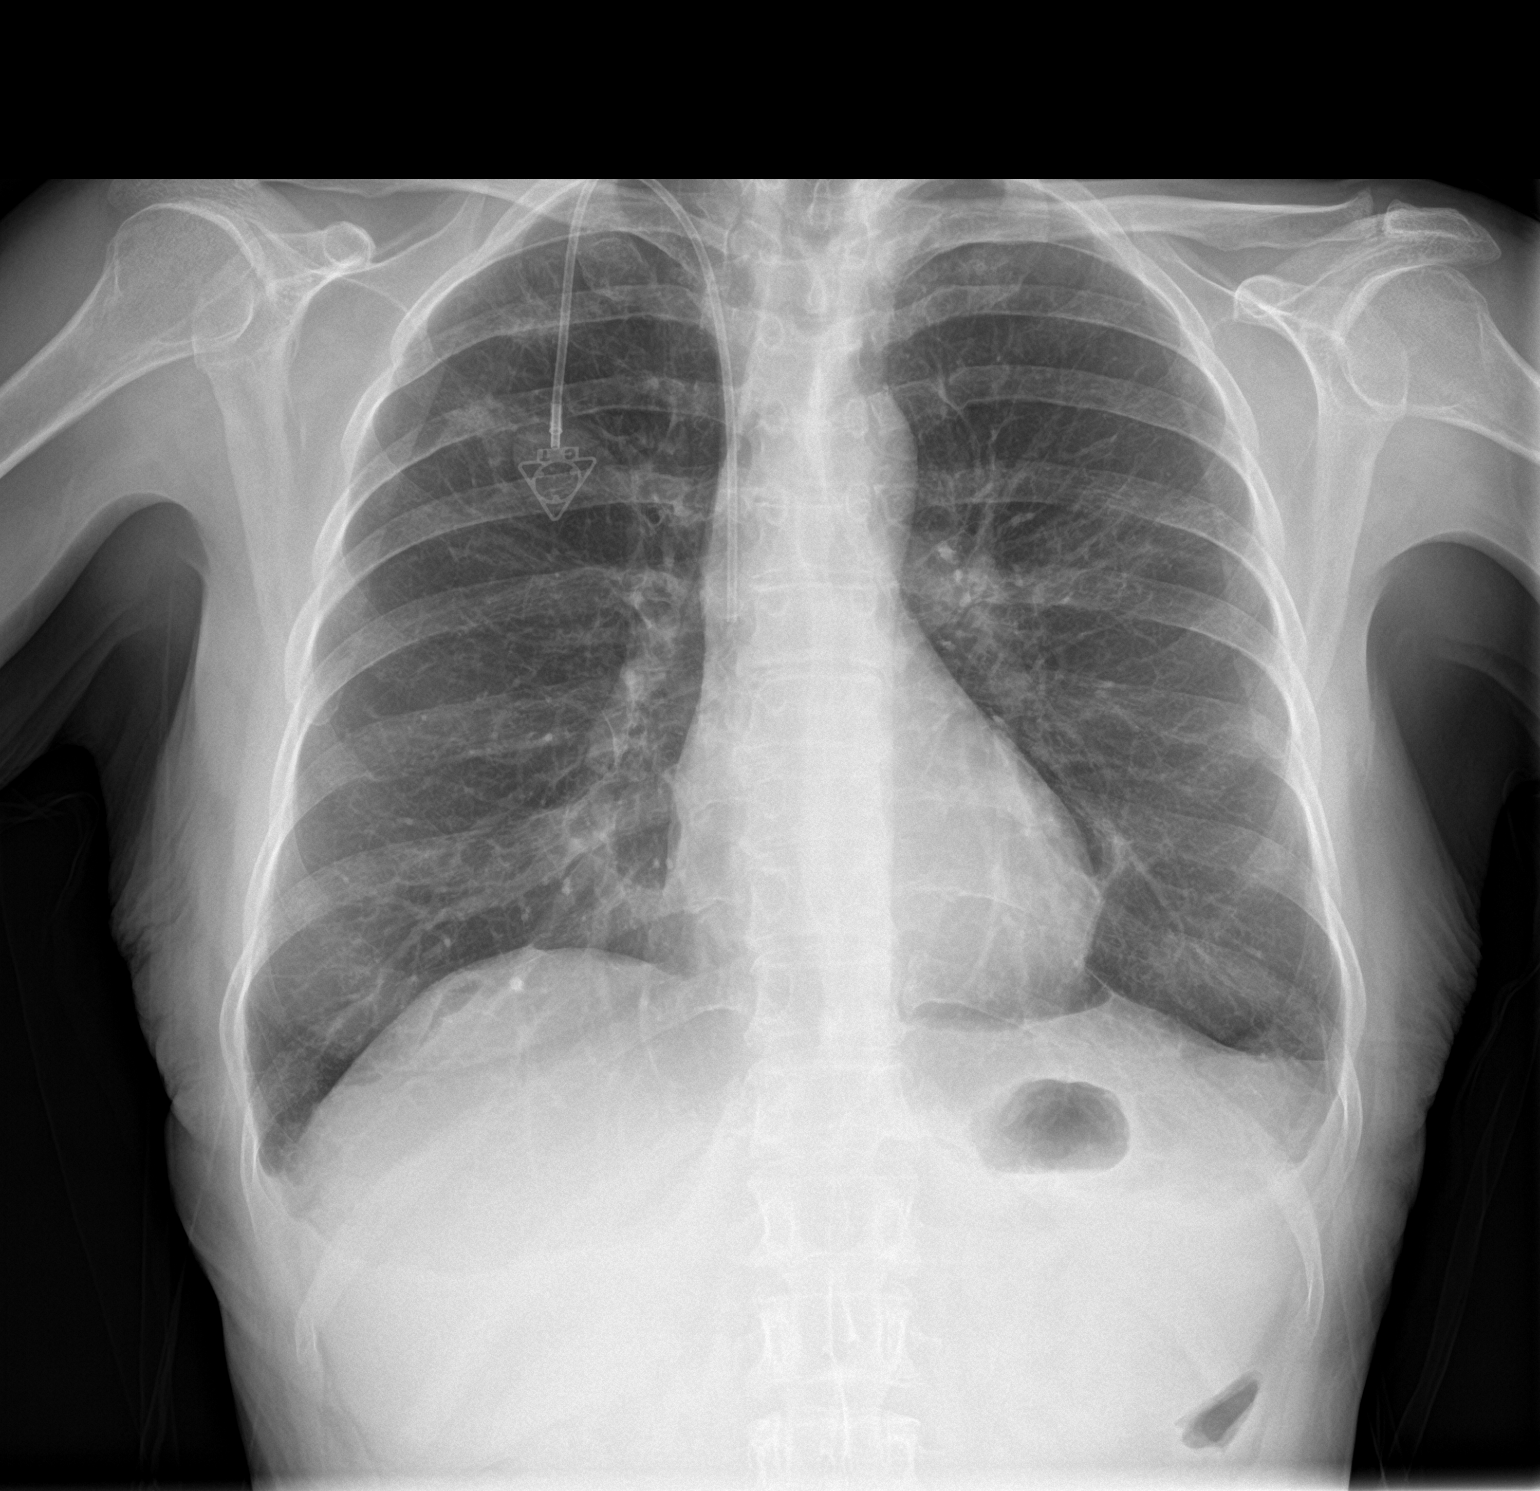

[chest lat]
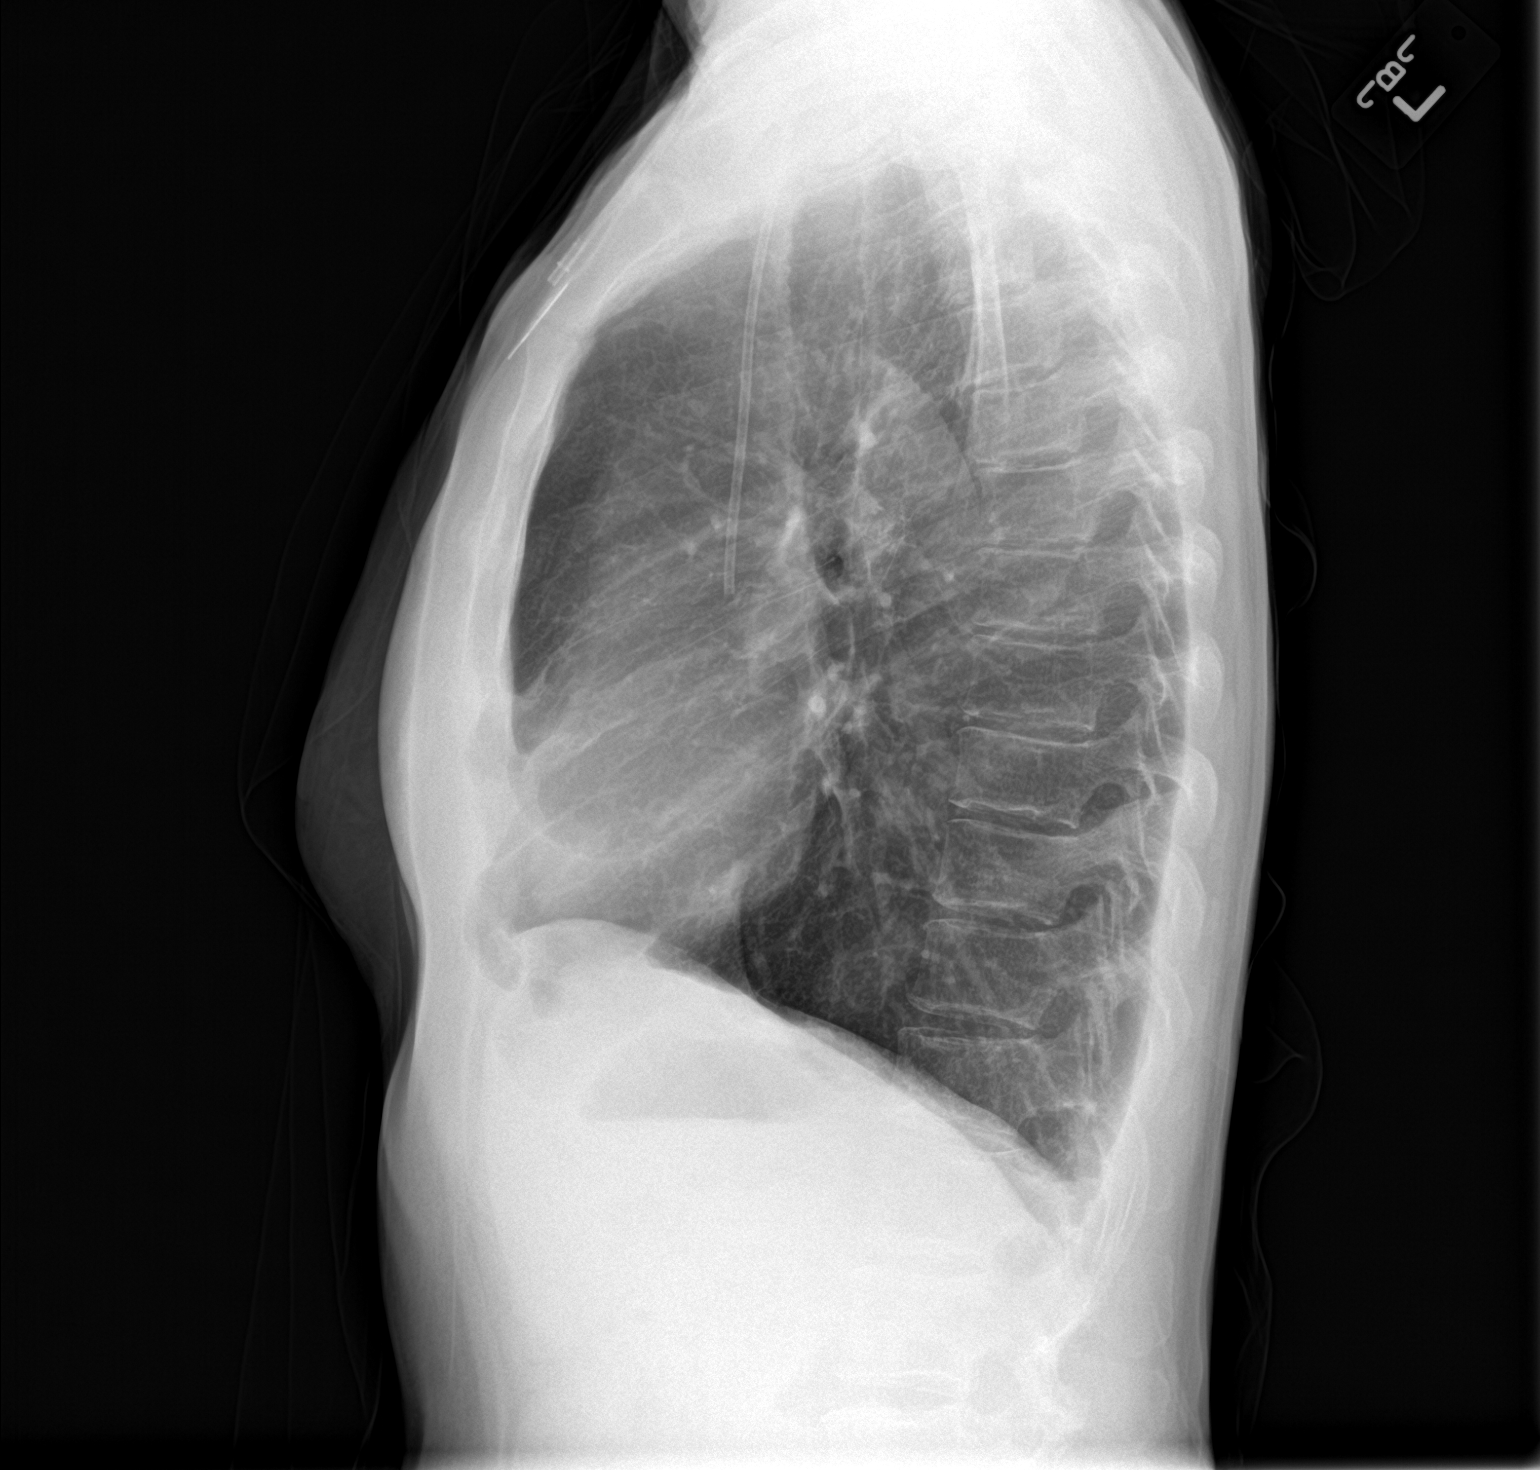

[2 of 2 positions shown; findings below may reference images not displayed]

FINDINGS: Cardiomediastinal silhouette is stable. No infiltrate or pleural
effusion. No pulmonary edema. Bony thorax is unremarkable. Right IJ
Port-A-Cath with tip in SVC.
IMPRESSION: No active cardiopulmonary disease.

## 2023-07-05 ENCOUNTER — Encounter (HOSPITAL_COMMUNITY): Payer: Self-pay | Admitting: *Deleted
